# Patient Record
Sex: Male | Born: 1960
Health system: Southern US, Community
[De-identification: ages and names within clinical notes are randomized; demographics above are authoritative.]

## PROBLEM LIST (undated history)

## (undated) DIAGNOSIS — I10 Essential (primary) hypertension: Secondary | ICD-10-CM

## (undated) DIAGNOSIS — K611 Rectal abscess: Secondary | ICD-10-CM

## (undated) DIAGNOSIS — G4730 Sleep apnea, unspecified: Secondary | ICD-10-CM

## (undated) DIAGNOSIS — I2699 Other pulmonary embolism without acute cor pulmonale: Secondary | ICD-10-CM

## (undated) DIAGNOSIS — K5790 Diverticulosis of intestine, part unspecified, without perforation or abscess without bleeding: Secondary | ICD-10-CM

## (undated) DIAGNOSIS — M109 Gout, unspecified: Secondary | ICD-10-CM

## (undated) DIAGNOSIS — N493 Fournier gangrene: Secondary | ICD-10-CM

## (undated) DIAGNOSIS — K635 Polyp of colon: Secondary | ICD-10-CM

## (undated) DIAGNOSIS — K643 Fourth degree hemorrhoids: Secondary | ICD-10-CM

## (undated) DIAGNOSIS — K5010 Crohn's disease of large intestine without complications: Secondary | ICD-10-CM

## (undated) HISTORY — DX: Polyp of colon: K63.5

## (undated) HISTORY — DX: Sleep apnea, unspecified: G47.30

## (undated) HISTORY — DX: Diverticulosis of intestine, part unspecified, without perforation or abscess without bleeding: K57.90

## (undated) HISTORY — PX: ANAL FISSURE REPAIR: SHX2312

## (undated) HISTORY — DX: Other pulmonary embolism without acute cor pulmonale: I26.99

## (undated) HISTORY — DX: Essential (primary) hypertension: I10

## (undated) HISTORY — PX: COLONOSCOPY: SHX174

---
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
## 2000-01-17 ENCOUNTER — Encounter: Admission: RE | Admit: 2000-01-17 | Discharge: 2000-01-17 | Payer: Self-pay | Admitting: Orthopedic Surgery

## 2000-01-17 ENCOUNTER — Encounter: Payer: Self-pay | Admitting: Orthopedic Surgery

## 2011-07-28 ENCOUNTER — Encounter: Payer: Self-pay | Admitting: Internal Medicine

## 2011-08-23 ENCOUNTER — Encounter: Payer: Self-pay | Admitting: Internal Medicine

## 2011-09-29 ENCOUNTER — Encounter: Payer: Self-pay | Admitting: Internal Medicine

## 2011-10-16 ENCOUNTER — Ambulatory Visit (AMBULATORY_SURGERY_CENTER): Payer: 59 | Admitting: *Deleted

## 2011-10-16 ENCOUNTER — Encounter: Payer: Self-pay | Admitting: Internal Medicine

## 2011-10-16 VITALS — Ht 71.0 in | Wt 209.6 lb

## 2011-10-16 DIAGNOSIS — Z1211 Encounter for screening for malignant neoplasm of colon: Secondary | ICD-10-CM

## 2011-10-16 MED ORDER — PEG-KCL-NACL-NASULF-NA ASC-C 100 G PO SOLR: ORAL | Status: DC

## 2011-10-30 ENCOUNTER — Ambulatory Visit (AMBULATORY_SURGERY_CENTER): Payer: 59 | Admitting: Internal Medicine

## 2011-10-30 ENCOUNTER — Encounter: Payer: Self-pay | Admitting: Internal Medicine

## 2011-10-30 VITALS — BP 139/77 | HR 67 | Temp 95.7°F | Resp 18 | Ht 71.0 in | Wt 209.0 lb

## 2011-10-30 DIAGNOSIS — Z1211 Encounter for screening for malignant neoplasm of colon: Secondary | ICD-10-CM

## 2011-10-30 DIAGNOSIS — D126 Benign neoplasm of colon, unspecified: Secondary | ICD-10-CM

## 2011-10-30 DIAGNOSIS — K5289 Other specified noninfective gastroenteritis and colitis: Secondary | ICD-10-CM

## 2011-10-30 DIAGNOSIS — K529 Noninfective gastroenteritis and colitis, unspecified: Secondary | ICD-10-CM

## 2011-10-30 MED ORDER — SODIUM CHLORIDE 0.9 % IV SOLN: 500.0000 mL | INTRAVENOUS | Status: DC

## 2011-10-30 NOTE — Op Note (Signed)
[facility] [address] [facility] [address]. [place], [place]  [id number]  COLONOSCOPY PROCEDURE REPORT  PATIENT:  Jesse English, Jesse English  MR#:  [id number] BIRTHDATE:  1960-12-10, 68 yrs. old  GENDER:  male ENDOSCOPIST:  Lowella Bandy. Olevia Perches, MD REF. BY:  Janalyn Rouse, M.D. PROCEDURE DATE:  10/30/2011 PROCEDURE:  Colonoscopy with biopsy and snare polypectomy ASA CLASS:  Class II INDICATIONS:  Routine Risk Screening MEDICATIONS:   MAC sedation, administered by CRNA, propofol (Diprivan) 630 mg  DESCRIPTION OF PROCEDURE:   After the risks and benefits and of the procedure were explained, informed consent was obtained. Digital rectal exam was performed and revealed no rectal masses. The LB CF-[id number] endoscope was introduced through the anus and advanced to the cecum, which was identified by both the appendix and ileocecal valve.  The quality of the prep was good, using MoviPrep.  The instrument was then slowly withdrawn as the colon was fully examined. <<PROCEDUREIMAGES>>  FINDINGS:  Colitis was found in the sigmoid colon. nonspecific colitis 50-60 cm, granular mucosa With standard forceps, biopsy was obtained and sent to pathology (see image6).  A sessile polyp was found in the sigmoid colon. at 15 cm soft appearing 10 mm polyp Polyp was snared, then cauterized with monopolar cautery. Retrieval was successful (see image8 and image9). snare polyp Internal Hemorrhoids were found (see image10).  This was otherwise a normal examination of the colon (see image7, image5, image4, image2, and image3).  Mild diverticulosis was found in the sigmoid colon (see image7).   Retroflexed views in the rectum revealed no abnormalities.    The scope was then withdrawn from the patient and the procedure completed.  COMPLICATIONS:  None ENDOSCOPIC IMPRESSION: 1) Colitis in the sigmoid colon 2) Sessile polyp in the sigmoid colon 3) Internal hemorrhoids 4) Otherwise normal examination 5) Mild diverticulosis  in the sigmoid colon RECOMMENDATIONS: 1) Await pathology results 2) Await biopsy results 3) High fiber diet.  REPEAT EXAM:  In 5 year(s) for.  ______________________________ Lowella Bandy. Olevia Perches, MD  CC:  n. eSIGNED:   Lowella Bandy. Elisabel Hanover at 10/30/2011 09:47 AM  Cyndee Brightly, [phone]

## 2011-10-30 NOTE — Patient Instructions (Signed)
YOU HAD AN ENDOSCOPIC PROCEDURE TODAY AT [facility]: Refer to the procedure report that was given to you for any specific questions about what was found during the examination.  If the procedure report does not answer your questions, please call your gastroenterologist to clarify.  If you requested that your care partner not be given the details of your procedure findings, then the procedure report has been included in a sealed envelope for you to review at your convenience later.  YOU SHOULD EXPECT: Some feelings of bloating in the abdomen. Passage of more gas than usual.  Walking can help get rid of the air that was put into your GI tract during the procedure and reduce the bloating. If you had a lower endoscopy (such as a colonoscopy or flexible sigmoidoscopy) you may notice spotting of blood in your stool or on the toilet paper. If you underwent a bowel prep for your procedure, then you may not have a normal bowel movement for a few days.  DIET: Your first meal following the procedure should be a light meal and then it is ok to progress to your normal diet.  A half-sandwich or bowl of soup is an example of a good first meal.  Heavy or fried foods are harder to digest and may make you feel nauseous or bloated.  Likewise meals heavy in dairy and vegetables can cause extra gas to form and this can also increase the bloating.  Drink plenty of fluids but you should avoid alcoholic beverages for 24 hours.  ACTIVITY: Your care partner should take you home directly after the procedure.  You should plan to take it easy, moving slowly for the rest of the day.  You can resume normal activity the day after the procedure however you should NOT DRIVE or use heavy machinery for 24 hours (because of the sedation medicines used during the test).    SYMPTOMS TO REPORT IMMEDIATELY: A gastroenterologist can be reached at any hour.  During normal business hours, 8:30 AM to 5:00 PM Monday through Friday,  call [phone].  After hours and on weekends, please call the GI answering service at [phone] who will take a message and have the physician on call contact you.   Following lower endoscopy (colonoscopy or flexible sigmoidoscopy):  Excessive amounts of blood in the stool  Significant tenderness or worsening of abdominal pains  Swelling of the abdomen that is new, acute  Fever of 100F or higher   FOLLOW UP: If any biopsies were taken you will be contacted by phone or by letter within the next 1-3 weeks.  Call your gastroenterologist if you have not heard about the biopsies in 3 weeks.  Our staff will call the home number listed on your records the next business day following your procedure to check on you and address any questions or concerns that you may have at that time regarding the information given to you following your procedure. This is a courtesy call and so if there is no answer at the home number and we have not heard from you through the emergency physician on call, we will assume that you have returned to your regular daily activities without incident.  SIGNATURES/CONFIDENTIALITY: You and/or your care partner have signed paperwork which will be entered into your electronic medical record.  These signatures attest to the fact that that the information above on your After Visit Summary has been reviewed and is understood.  Full responsibility of the confidentiality of  this discharge information lies with you and/or your care-partner.   Follow discharge instructions given today. Biopsies taken today, colon polyp x1 removed. Handouts given on polyps,hemorrhoids,diverticulosis,and high fiber diet. Try to follow high fiber diet. Repeat colonoscopy in 5 years. Call us with any questions or concerns. Thank you!!

## 2011-10-30 NOTE — Progress Notes (Signed)
Patient did not experience any of the following events: a burn prior to discharge; a fall within the facility; wrong site/side/patient/procedure/implant event; or a hospital transfer or hospital admission upon discharge from the facility. (G8907) Patient did not have preoperative order for IV antibiotic SSI prophylaxis. (G8918)  

## 2011-10-31 ENCOUNTER — Telehealth: Payer: Self-pay | Admitting: *Deleted

## 2011-10-31 NOTE — Telephone Encounter (Signed)
No answer left message to call offfice if questions or concerns.

## 2011-11-02 ENCOUNTER — Encounter: Payer: Self-pay | Admitting: Internal Medicine

## 2015-08-11 ENCOUNTER — Encounter: Payer: Self-pay | Admitting: Gastroenterology

## 2015-08-12 ENCOUNTER — Other Ambulatory Visit: Payer: Self-pay | Admitting: Internal Medicine

## 2015-08-12 DIAGNOSIS — K5792 Diverticulitis of intestine, part unspecified, without perforation or abscess without bleeding: Secondary | ICD-10-CM

## 2015-08-16 ENCOUNTER — Ambulatory Visit
Admission: RE | Admit: 2015-08-16 | Discharge: 2015-08-16 | Disposition: A | Discharge status: Home / Self Care | Payer: 59 | Source: Ambulatory Visit | Attending: Internal Medicine | Admitting: Internal Medicine

## 2015-08-16 DIAGNOSIS — K5792 Diverticulitis of intestine, part unspecified, without perforation or abscess without bleeding: Secondary | ICD-10-CM

## 2015-08-16 MED ORDER — IOPAMIDOL (ISOVUE-300) INJECTION 61%
125.0000 mL | Freq: Once | INTRAVENOUS | Status: AC | PRN
Start: 2015-08-16 — End: 2015-08-16
  Administered 2015-08-16: 125 mL via INTRAVENOUS

## 2015-08-16 MED ORDER — IOHEXOL 300 MG/ML  SOLN
30.0000 mL | Freq: Once | INTRAMUSCULAR | Status: AC | PRN
  Administered 2015-08-16: 30 mL via ORAL

## 2015-09-01 ENCOUNTER — Encounter: Payer: Self-pay | Admitting: Gastroenterology

## 2015-09-01 ENCOUNTER — Other Ambulatory Visit (INDEPENDENT_AMBULATORY_CARE_PROVIDER_SITE_OTHER): Payer: 59

## 2015-09-01 ENCOUNTER — Ambulatory Visit (INDEPENDENT_AMBULATORY_CARE_PROVIDER_SITE_OTHER): Payer: 59 | Admitting: Gastroenterology

## 2015-09-01 VITALS — BP 130/74 | HR 80 | Ht 71.0 in | Wt 234.6 lb

## 2015-09-01 DIAGNOSIS — R197 Diarrhea, unspecified: Secondary | ICD-10-CM | POA: Diagnosis not present

## 2015-09-01 DIAGNOSIS — K529 Noninfective gastroenteritis and colitis, unspecified: Secondary | ICD-10-CM | POA: Diagnosis not present

## 2015-09-01 DIAGNOSIS — K625 Hemorrhage of anus and rectum: Secondary | ICD-10-CM

## 2015-09-01 LAB — HEPATIC FUNCTION PANEL
ALBUMIN: 4 g/dL (ref 3.5–5.2)
ALT: 26 U/L (ref 0–53)
AST: 24 U/L (ref 0–37)
Alkaline Phosphatase: 35 U/L — ABNORMAL LOW (ref 39–117)
BILIRUBIN TOTAL: 0.8 mg/dL (ref 0.2–1.2)
Bilirubin, Direct: 0.1 mg/dL (ref 0.0–0.3)
Total Protein: 7.2 g/dL (ref 6.0–8.3)

## 2015-09-01 LAB — FERRITIN: Ferritin: 169.5 ng/mL (ref 22.0–322.0)

## 2015-09-01 LAB — FOLATE: FOLATE: 9.1 ng/mL (ref >5.9)

## 2015-09-01 LAB — IBC PANEL
Iron: 112 ug/dL (ref 42–165)
SATURATION RATIOS: 31.5 % (ref 20.0–50.0)
TRANSFERRIN: 254 mg/dL (ref 212.0–360.0)

## 2015-09-01 LAB — VITAMIN B12: VITAMIN B 12: 188 pg/mL — AB (ref 211–911)

## 2015-09-01 LAB — SEDIMENTATION RATE: SED RATE: 14 mm/h (ref 0–22)

## 2015-09-01 LAB — HIGH SENSITIVITY CRP: CRP, High Sensitivity: 0.77 mg/L (ref 0.000–5.000)

## 2015-09-01 LAB — TSH: TSH: 0.31 u[IU]/mL — ABNORMAL LOW (ref 0.35–4.50)

## 2015-09-01 MED ORDER — NA SULFATE-K SULFATE-MG SULF 17.5-3.13-1.6 GM/177ML PO SOLN: 1.0000 | Freq: Once | ORAL | Status: DC

## 2015-09-01 NOTE — Progress Notes (Signed)
Jesse English    [id number]    11/29/1960  Primary Care Physician:Shaw, Gwyndolyn Saxon, MD  Referring Physician: Marton Redwood, MD [address] [place], [place] [address]  Chief complaint:  Diarrhea and blood per rectum  HPI: 55 yr M here for new patient visit with complaints of diarrhea and blood per rectum he started having symptoms around Christmas. He noticed increased bowel frequency and liquid bowel movements with blood and mucus .He was having about 20 episodes a day , at that time he was traveling and was visiting his daughter . 2 weeks later when he returned his PMD started him on Cipro and Flagyl which improved his symptoms, currently he feels his bowel movements are starting to get a little more formed and does not have blood , he continues to have to 3 bowel movements per day which is slightly more than his normal bowel habits.  Outpatient Encounter Prescriptions as of 09/01/2015  Medication Sig  . [DISCONTINUED] amLODipine (NORVASC) 5 MG tablet Take 5 mg by mouth daily. Reported on 09/01/2015  . [DISCONTINUED] hydrochlorothiazide (MICROZIDE) 12.5 MG capsule Take 12.5 mg by mouth daily. Reported on 09/01/2015   No facility-administered encounter medications on file as of 09/01/2015.    Allergies as of 09/01/2015  . (No Known Allergies)    Past Medical History  Diagnosis Date  . Hypertension   . Sleep apnea     cpap  . Colon polyps     2013  . Diverticulosis     Past Surgical History  Procedure Laterality Date  . No past surgeries      Family History  Problem Relation Age of Onset  . Colon cancer Neg Hx   . Esophageal cancer Neg Hx   . Stomach cancer Neg Hx   . Rectal cancer Neg Hx     Social History   Social History  . Marital Status: Single    Spouse Name: N/A  . Number of Children: N/A  . Years of Education: N/A   Occupational History  . Not on file.   Social History Main Topics  . Smoking status: Current Some Day Smoker    Types:  Cigars  . Smokeless tobacco: Never Used     Comment: smokes 3 4 cigars weekly  . Alcohol Use: 6.0 oz/week    10 Cans of beer per week  . Drug Use: No  . Sexual Activity: Not on file   Other Topics Concern  . Not on file   Social History Narrative      Review of systems: Review of Systems  Constitutional: Negative for fever and chills.  HENT: Negative.   Eyes: Negative for blurred vision.  Respiratory: Negative for cough, shortness of breath and wheezing.   Cardiovascular: Negative for chest pain and palpitations.  Gastrointestinal: as per HPI Genitourinary: Negative for dysuria, urgency, frequency and hematuria.  Musculoskeletal: Negative for myalgias, back pain and joint pain.  Skin: Negative for itching and rash.  Neurological: Negative for dizziness, tremors, focal weakness, seizures and loss of consciousness.  Endo/Heme/Allergies: Negative for environmental allergies.  Psychiatric/Behavioral: Negative for depression, suicidal ideas and hallucinations.  All other systems reviewed and are negative.   Physical Exam: Filed Vitals:   09/01/15 0831  BP: 130/74  Pulse: 80   Gen:      No acute distress HEENT:  EOMI, sclera anicteric Neck:     No masses; no thyromegaly Lungs:    Clear to auscultation  bilaterally; normal respiratory effort CV:         Regular rate and rhythm; no murmurs Abd:      + bowel sounds; soft, non-tender; no palpable masses, no distension Ext:    No edema; adequate peripheral perfusion Skin:      Warm and dry; no rash Neuro: alert and oriented x 3 Psych: normal mood and affect  Data Reviewed:  Colonoscopy 10/2011 Findings suggestive of colitis in sigmoid colon, 10 mm sessile polyp removed with snare cautery. Internal hemorrhoids 1. Surgical [P], colon at 50-60 cm, bx CHRONIC ACTIVE COLITIS. SEE MICROSCOPIC DESCRIPTION. NO EVIDENCE OF DYSPLASIA. 2. Surgical [P], colon at 20 cm, polyp HYPERPLASTIC POLYP. NO ADENOMATOUS CHANGE OR  MALIGNANCY Microscopic Comment 1. There is increased infiltrate in the lamina propria with associated benign lymphoid aggregates. There is patchy, mild, neutrophilic cryptitis and altered crypt architecture. The most likely differential diagnostic consideration is idiopathic inflammatory bowel disease. No dysplasia or granulomas are identified.  CT abd & pelvis 08/16/2015 1. No acute findings. 2. No evidence of malignancy. 3. Healed granuloma in the left lower lobe. 4. No abnormality of the gastrointestinal tract. 5. Prominent degenerative changes noted throughout the visualized spine. Assessment and Plan/Recommendations: 55 year old male here with complaints of diarrhea and blood per rectum He had findings suggestive of chronic active colitis in the left colon based on his colonoscopy in 2013 His symptoms have somewhat improved after a course of Cipro and Flagyl a few weeks ago We'll check CBC, BMP, LFT, ESR, C. difficile and fecal lactoferrin Schedule for colonoscopy   Damaris Hippo , MD [phone] Mon-Fri 8a-5p [phone] after 5p, weekends, holidays

## 2015-09-01 NOTE — Patient Instructions (Signed)

## 2015-09-03 ENCOUNTER — Telehealth: Payer: Self-pay | Admitting: *Deleted

## 2015-09-03 ENCOUNTER — Other Ambulatory Visit: Payer: Self-pay

## 2015-09-03 LAB — FECAL LACTOFERRIN, QUANT: Lactoferrin: NEGATIVE

## 2015-09-03 LAB — CLOSTRIDIUM DIFFICILE BY PCR: CDIFFPCR: DETECTED — AB

## 2015-09-03 MED ORDER — METRONIDAZOLE 500 MG PO TABS: 500.0000 mg | ORAL_TABLET | Freq: Three times a day (TID) | ORAL | Status: DC

## 2015-09-03 NOTE — Telephone Encounter (Signed)
L/M for patient that he has a prescription at [facility] to pick up. Told him to call back before 5pm if he got my message   Patient has positive c diff

## 2015-09-06 ENCOUNTER — Telehealth: Payer: Self-pay | Admitting: Gastroenterology

## 2015-09-23 ENCOUNTER — Encounter: Payer: Self-pay | Admitting: Gastroenterology

## 2015-09-23 ENCOUNTER — Ambulatory Visit (AMBULATORY_SURGERY_CENTER): Payer: 59 | Admitting: Gastroenterology

## 2015-09-23 VITALS — BP 134/77 | HR 68 | Temp 98.2°F | Resp 15 | Ht 71.0 in | Wt 234.0 lb

## 2015-09-23 DIAGNOSIS — K625 Hemorrhage of anus and rectum: Secondary | ICD-10-CM

## 2015-09-23 MED ORDER — SODIUM CHLORIDE 0.9 % IV SOLN: 500.0000 mL | INTRAVENOUS | Status: DC

## 2015-09-23 MED ORDER — MESALAMINE 1000 MG RE SUPP: 1000.0000 mg | Freq: Every day | RECTAL | Status: DC

## 2015-09-23 NOTE — Progress Notes (Signed)
A o x3 report to [employer]

## 2015-09-23 NOTE — Patient Instructions (Signed)

## 2015-09-23 NOTE — Op Note (Signed)
[place] Patient Name: Jesse English Procedure Date: 09/23/2015 7:36 AM MRN: [id number] Endoscopist: Mauri Pole , MD Age: 55 Referring MD:  Date of Birth: Nov 21, 1960 Gender: Male Procedure:                Colonoscopy Indications:              Clinically significant diarrhea of unexplained                            origin, Rectal bleeding Medicines:                Propofol total dose 400 mg IV, Monitored Anesthesia                            Care Procedure:                Pre-Anesthesia Assessment:                           - Prior to the procedure, a History and Physical                            was performed, and patient medications and                            allergies were reviewed. The patient's tolerance of                            previous anesthesia was also reviewed. The risks                            and benefits of the procedure and the sedation                            options and risks were discussed with the patient.                            All questions were answered, and informed consent                            was obtained. Anticoagulants: The patient has taken                            aspirin. It was decided not to withhold this                            medication prior to procedure. ASA Grade                            Assessment: II - A patient with mild systemic                            disease. After reviewing the risks and benefits,  the patient was deemed in satisfactory condition to                            undergo the procedure.                           After obtaining informed consent, the colonoscope                            was passed under direct vision. Throughout the                            procedure, the patient's blood pressure, pulse, and                            oxygen saturations were monitored continuously. The                            Model CF-HQ190L [id number])  scope was introduced                            through the anus and advanced to the the terminal                            ileum, with identification of the appendiceal                            orifice and IC valve. The colonoscopy was performed                            without difficulty. The patient tolerated the                            procedure well. The quality of the bowel                            preparation was good. The terminal ileum, ileocecal                            valve, appendiceal orifice, and rectum were                            photographed. Scope In: 8:17:23 AM Scope Out: 8:28:44 AM Scope Withdrawal Time: 0 hours 8 minutes 36 seconds  Total Procedure Duration: 0 hours 11 minutes 21 seconds  Findings:      Non-bleeding internal hemorrhoids were found during retroflexion. The       hemorrhoids were small.      An area of moderately congested mucosa was found in the recto-sigmoid       colon. Several biopsies were obtained with cold forceps for evaluation       of inflammatory bowel disease randomly in the rectum, in the sigmoid       colon and in the descending colon. Complications:            No immediate complications. Estimated Blood  Loss:     Estimated blood loss was minimal. Impression:               - Non-bleeding internal hemorrhoids.                           - Congested mucosa in the recto-sigmoid colon.                           - Several biopsies were obtained in the rectum, in                            the sigmoid colon and in the descending colon. Recommendation:           - Patient has a contact number available for                            emergencies. The signs and symptoms of potential                            delayed complications were discussed with the                            patient. Return to normal activities tomorrow.                            Written discharge instructions were provided to the                             patient.                           - Resume previous diet.                           - Continue present medications.                           - Await pathology results.                           - Repeat colonoscopy is recommended for                            surveillance. The colonoscopy date will be                            determined after pathology results from today's                            exam become available for review.                           - Return to GI clinic in 3 months.                           - Use Canasa 1000 mg suppository  1 per rectum QHS                            for 2 weeks. Procedure Code(s):        --- Professional ---                           [id number], Colonoscopy, flexible; with biopsy, single                            or multiple CPT copyright 2016 American Medical Association. All rights reserved. Mauri Pole, MD 09/23/2015 8:38:11 AM This report has been signed electronically. Number of Addenda: 0

## 2015-09-23 NOTE — Progress Notes (Signed)
Called to room to assist during endoscopic procedure.  Patient ID and intended procedure confirmed with present staff. Received instructions for my participation in the procedure from the performing physician.  

## 2015-09-24 ENCOUNTER — Telehealth: Payer: Self-pay

## 2015-09-24 NOTE — Telephone Encounter (Signed)
  Follow up Call-  Call back number 09/23/2015  Post procedure Call Back phone  # [phone]  Permission to leave phone message Yes    Patient was called for follow up after procedure on 09/23/2015. No answer at the number given for follow up phone call. A message was left on the answering machine.

## 2015-10-01 ENCOUNTER — Encounter: Payer: Self-pay | Admitting: Gastroenterology

## 2015-10-04 ENCOUNTER — Other Ambulatory Visit: Payer: Self-pay | Admitting: Gastroenterology

## 2015-10-04 ENCOUNTER — Encounter: Payer: Self-pay | Admitting: Gastroenterology

## 2015-10-04 DIAGNOSIS — K5100 Ulcerative (chronic) pancolitis without complications: Secondary | ICD-10-CM

## 2015-10-04 MED ORDER — MESALAMINE 1.2 G PO TBEC: 2.4000 g | DELAYED_RELEASE_TABLET | Freq: Every day | ORAL | Status: DC

## 2015-10-15 ENCOUNTER — Other Ambulatory Visit: Payer: Self-pay | Admitting: Gastroenterology

## 2015-10-18 ENCOUNTER — Telehealth: Payer: Self-pay | Admitting: Gastroenterology

## 2015-10-18 MED ORDER — MESALAMINE 1.2 G PO TBEC: 2.4000 g | DELAYED_RELEASE_TABLET | Freq: Every day | ORAL | Status: DC

## 2015-10-18 NOTE — Telephone Encounter (Signed)
Med sent patient informed

## 2015-10-18 NOTE — Telephone Encounter (Signed)
Faxed prescription to patient  [facility] did not receive script  I cant get through to [facility] on there automated system

## 2015-11-04 ENCOUNTER — Other Ambulatory Visit: Payer: 59

## 2015-11-04 ENCOUNTER — Telehealth: Payer: Self-pay | Admitting: Gastroenterology

## 2015-11-04 ENCOUNTER — Other Ambulatory Visit: Payer: Self-pay

## 2015-11-04 DIAGNOSIS — K51019 Ulcerative (chronic) pancolitis with unspecified complications: Secondary | ICD-10-CM

## 2015-11-04 MED ORDER — VANCOMYCIN HCL 125 MG PO CAPS: 125.0000 mg | ORAL_CAPSULE | Freq: Four times a day (QID) | ORAL | Status: DC

## 2015-11-04 MED ORDER — MESALAMINE 1000 MG RE SUPP: 1000.0000 mg | Freq: Every day | RECTAL | Status: DC

## 2015-11-04 NOTE — Telephone Encounter (Signed)
Colitis patient. Last colonoscopy was in March. Bx was active chronic colitis. He had been doing well until about 5 days ago. He is experiencing bloody bowel movements about 8 to 10 a day. He absolutely does not want any Flagyl.

## 2015-11-04 NOTE — Telephone Encounter (Signed)
Please have him check C.diff and we can start PO vancomycin 136m Q6h empirically . Continue Lialda and Canasa suppository refill for 2-4 weeks. He will need office visit , ok to schedule with APP

## 2015-11-05 LAB — CLOSTRIDIUM DIFFICILE BY PCR: Toxigenic C. Difficile by PCR: DETECTED — CR

## 2015-11-18 ENCOUNTER — Encounter: Payer: Self-pay | Admitting: Physician Assistant

## 2015-11-18 ENCOUNTER — Ambulatory Visit (INDEPENDENT_AMBULATORY_CARE_PROVIDER_SITE_OTHER): Payer: 59 | Admitting: Physician Assistant

## 2015-11-18 ENCOUNTER — Other Ambulatory Visit (INDEPENDENT_AMBULATORY_CARE_PROVIDER_SITE_OTHER): Payer: 59

## 2015-11-18 VITALS — BP 118/68 | HR 66 | Ht 71.0 in | Wt 239.0 lb

## 2015-11-18 DIAGNOSIS — K51918 Ulcerative colitis, unspecified with other complication: Secondary | ICD-10-CM | POA: Diagnosis not present

## 2015-11-18 DIAGNOSIS — K625 Hemorrhage of anus and rectum: Secondary | ICD-10-CM | POA: Diagnosis not present

## 2015-11-18 DIAGNOSIS — A047 Enterocolitis due to Clostridium difficile: Secondary | ICD-10-CM

## 2015-11-18 DIAGNOSIS — A0472 Enterocolitis due to Clostridium difficile, not specified as recurrent: Secondary | ICD-10-CM

## 2015-11-18 LAB — CBC WITH DIFFERENTIAL/PLATELET
BASOS ABS: 0 10*3/uL (ref 0.0–0.1)
Basophils Relative: 0.6 % (ref 0.0–3.0)
Eosinophils Absolute: 0.4 10*3/uL (ref 0.0–0.7)
Eosinophils Relative: 5.7 % — ABNORMAL HIGH (ref 0.0–5.0)
HEMATOCRIT: 43.8 % (ref 39.0–52.0)
Hemoglobin: 15.1 g/dL (ref 13.0–17.0)
LYMPHS PCT: 23.8 % (ref 12.0–46.0)
Lymphs Abs: 1.6 10*3/uL (ref 0.7–4.0)
MCHC: 34.6 g/dL (ref 30.0–36.0)
MCV: 93 fl (ref 78.0–100.0)
MONOS PCT: 14.9 % — AB (ref 3.0–12.0)
Monocytes Absolute: 1 10*3/uL (ref 0.1–1.0)
Neutro Abs: 3.7 10*3/uL (ref 1.4–7.7)
Neutrophils Relative %: 55 % (ref 43.0–77.0)
PLATELETS: 213 10*3/uL (ref 150.0–400.0)
RBC: 4.71 Mil/uL (ref 4.22–5.81)
RDW: 13.3 % (ref 11.5–15.5)
WBC: 6.7 10*3/uL (ref 4.0–10.5)

## 2015-11-18 LAB — BASIC METABOLIC PANEL
BUN: 24 mg/dL — AB (ref 6–23)
CO2: 29 mEq/L (ref 19–32)
CREATININE: 1.23 mg/dL (ref 0.40–1.50)
Calcium: 9.6 mg/dL (ref 8.4–10.5)
Chloride: 101 mEq/L (ref 96–112)
GFR: 64.39 mL/min (ref >60.00)
Glucose, Bld: 105 mg/dL — ABNORMAL HIGH (ref 70–99)
POTASSIUM: 4.5 meq/L (ref 3.5–5.1)
Sodium: 139 mEq/L (ref 135–145)

## 2015-11-18 LAB — SEDIMENTATION RATE: SED RATE: 20 mm/h (ref 0–22)

## 2015-11-18 LAB — HIGH SENSITIVITY CRP: CRP, High Sensitivity: 1.79 mg/L (ref 0.000–5.000)

## 2015-11-18 MED ORDER — MESALAMINE 4 G RE ENEM: 4.0000 g | ENEMA | Freq: Every day | RECTAL | Status: DC

## 2015-11-18 MED ORDER — BUDESONIDE 9 MG PO TB24: 9.0000 mg | ORAL_TABLET | Freq: Every morning | ORAL | Status: DC

## 2015-11-18 MED ORDER — MESALAMINE 1.2 G PO TBEC: DELAYED_RELEASE_TABLET | ORAL | Status: DC

## 2015-11-18 MED ORDER — VANCOMYCIN HCL 125 MG PO CAPS: ORAL_CAPSULE | ORAL | Status: DC

## 2015-11-18 NOTE — Patient Instructions (Addendum)
Please go to the basement level to have your labs drawn.  Stop the Canasa. Start Rowasa Enema at bedtime.  Increase the Lialda to 2 tablets by mouth twice daily.   We sent prescriptions to [facility] [address]  1. [place] 9 mg 2. Lialda 1.2g 3. Rowasa Enemas 4. Vancomycin 125 mg.   You have a follow up with Dr. Silverio Decamp on 12-22-2015 at 9:30 am.

## 2015-11-18 NOTE — Progress Notes (Signed)
Patient ID: Jesse English, male   DOB: [birth year], 55 y.o.   MRN: [id number]   Subjective:    Patient ID: Jesse English, male    DOB: Aug 15, 1960, 55 y.o.   MRN: [id number]  HPI Jesse English  Is a pleasant 55 year old white male recently established with Dr. Silverio Decamp who has a new diagnosis of chronic active colitis made at the time of colonoscopy in March 2017. Appendectomy was found to have a rectosigmoid colitis. Biopsies from the rectum showed the chronic active colitis consistent with IBD and random biopsies of the colon showed chronic quiescent colitis. Stool studies have been done in February and he was C. difficile positive, initially treated with a course of Flagyl. Because of persistent complaints of diarrhea C. difficile was repeated 11/04/2015 and again was positive. He is now completing a 14 day course of vancomycin. He comes in today for follow-up stating that he really doesn't feel any better. Still having 15-20 bowel movements per day of very small volume urgent stools. He says very frequently passing bloody clots with soft stool. Is not having any significant abdominal pain does have some cramping. No documented fever but endorses rare chills. Appetite is been fine no nausea or vomiting. Says is having difficulty retaining the Canasa suppositories and that infrequently comes out in the middle of the night.  Review of Systems Pertinent positive and negative review of systems were noted in the above HPI section.  All other review of systems was otherwise negative.  Outpatient Encounter Prescriptions as of 11/18/2015  Medication Sig  . hydrochlorothiazide (MICROZIDE) 12.5 MG capsule TK 1 C PO QD  . mesalamine (CANASA) 1000 MG suppository Place 1 suppository (1,000 mg total) rectally at bedtime.  . mesalamine (LIALDA) 1.2 g EC tablet Take 2 tablets twice daily  . vancomycin (VANCOCIN) 125 MG capsule Take 1 tab 3 times daily x 7 days, then twice daily x 7 days, then 1 daily x 7 days.  .  [DISCONTINUED] mesalamine (LIALDA) 1.2 g EC tablet Take 2 tablets (2.4 g total) by mouth daily with breakfast.  . [DISCONTINUED] vancomycin (VANCOCIN) 125 MG capsule Take 1 capsule (125 mg total) by mouth every 6 (six) hours.  . Budesonide (UCERIS) 9 MG TB24 Take 9 mg by mouth every morning.  . mesalamine (ROWASA) 4 g enema Place 60 mLs (4 g total) rectally at bedtime.   No facility-administered encounter medications on file as of 11/18/2015.   Allergies  Allergen Reactions  . Ace Inhibitors Swelling    Facial swelling and small intestinal swelling.   There are no active problems to display for this patient.  Social History   Social History  . Marital Status: Single    Spouse Name: N/A  . Number of Children: N/A  . Years of Education: N/A   Occupational History  . Not on file.   Social History Main Topics  . Smoking status: Current Some Day Smoker    Types: Cigars  . Smokeless tobacco: Never Used     Comment: smokes 3 4 cigars weekly  . Alcohol Use: 6.0 oz/week    10 Cans of beer per week  . Drug Use: No  . Sexual Activity: Not on file   Other Topics Concern  . Not on file   Social History Narrative    Mr. Dempster's family history is negative for Colon cancer, Esophageal cancer, Stomach cancer, and Rectal cancer.      Objective:    Filed Vitals:   11/18/15 9373  BP: 118/68  Pulse: 66    Physical Exam  well-developed white male in no acute distress, pleasant blood pressure 118/68 pulse 66 height 5 foot 11 weight 239. HEENT; nontraumatic normocephalic EOMI PERRLA sclera anicteric, Cardiovascular ;regular rate and rhythm with S1-S2 no murmur or gallop, Pulmonary; clear bilaterally, Abdomen ;large soft. Minimally tender in left lower quadrant no guarding or rebound no palpable mass or hepatosplenomegaly bowel sounds are present, Rectal ;exam not done, Ext;no clubbing cyanosis or edema skin warm and dry, Neuropsych ;mood and affect appropriate     Assessment &  Plan:   #58  55 year old white male with new diagnosis of chronic active colitis and superimposed C. difficile. Patient is stable but symptoms have not improved on current regimen. He is on day 12 of a 14 day course of vancomycin for C. difficile.  Plan; increase Lialda to 2.4 g by mouth twice a day Start Uceris 9 mg by mouth every morning Stop Canasa suppositories and start Rowasa enemas daily at bedtime he is asked to continue 1 month Will continue a longer tapered course of vancomycin to assure clearance of C. difficile.  Complete the 14 days , then will decrease to 125 mg 3 times a day 7 days then 125 mg by mouth twice a day 7 days then once daily 7 days. He'll have follow-up office visit with Dr. Silverio Decamp in 4-6 weeks. He knows to call in the interim should his symptoms worsen.   Amy Genia Harold PA-C 11/18/2015   Cc: Marton Redwood, MD

## 2015-11-19 ENCOUNTER — Other Ambulatory Visit: Payer: Self-pay

## 2015-11-19 MED ORDER — VANCOMYCIN HCL 125 MG PO CAPS: ORAL_CAPSULE | ORAL | Status: DC

## 2015-11-19 MED ORDER — MESALAMINE 4 G RE ENEM: 4.0000 g | ENEMA | Freq: Every day | RECTAL | Status: DC

## 2015-11-19 MED ORDER — MESALAMINE 1.2 G PO TBEC: DELAYED_RELEASE_TABLET | ORAL | Status: DC

## 2015-11-19 MED ORDER — BUDESONIDE 9 MG PO TB24: 9.0000 mg | ORAL_TABLET | Freq: Every morning | ORAL | Status: DC

## 2015-11-19 NOTE — Progress Notes (Signed)
Reviewed and agree with documentation and assessment and plan. If continues to be symptomatic even after completion of PO Vancomycin prolonged taper, may have to consider flex sig to r/o CMV K. Denzil Magnuson , MD

## 2015-11-29 ENCOUNTER — Ambulatory Visit: Payer: 59 | Admitting: Gastroenterology

## 2015-12-22 ENCOUNTER — Encounter: Payer: Self-pay | Admitting: Gastroenterology

## 2015-12-22 ENCOUNTER — Other Ambulatory Visit (INDEPENDENT_AMBULATORY_CARE_PROVIDER_SITE_OTHER): Payer: 59

## 2015-12-22 ENCOUNTER — Ambulatory Visit (INDEPENDENT_AMBULATORY_CARE_PROVIDER_SITE_OTHER): Payer: 59 | Admitting: Gastroenterology

## 2015-12-22 VITALS — BP 126/80 | HR 80 | Ht 71.0 in | Wt 234.0 lb

## 2015-12-22 DIAGNOSIS — IMO0001 Reserved for inherently not codable concepts without codable children: Secondary | ICD-10-CM

## 2015-12-22 DIAGNOSIS — K5180 Other ulcerative colitis without complications: Secondary | ICD-10-CM

## 2015-12-22 DIAGNOSIS — Z8719 Personal history of other diseases of the digestive system: Secondary | ICD-10-CM | POA: Diagnosis not present

## 2015-12-22 DIAGNOSIS — K5090 Crohn's disease, unspecified, without complications: Secondary | ICD-10-CM | POA: Insufficient documentation

## 2015-12-22 LAB — IBC PANEL
Iron: 160 ug/dL (ref 42–165)
SATURATION RATIOS: 43.5 % (ref 20.0–50.0)
TRANSFERRIN: 263 mg/dL (ref 212.0–360.0)

## 2015-12-22 LAB — CBC WITH DIFFERENTIAL/PLATELET
Basophils Absolute: 0 10*3/uL (ref 0.0–0.1)
Basophils Relative: 0.4 % (ref 0.0–3.0)
EOS ABS: 0.1 10*3/uL (ref 0.0–0.7)
Eosinophils Relative: 1.2 % (ref 0.0–5.0)
HEMATOCRIT: 44.9 % (ref 39.0–52.0)
HEMOGLOBIN: 15.3 g/dL (ref 13.0–17.0)
LYMPHS ABS: 1.8 10*3/uL (ref 0.7–4.0)
LYMPHS PCT: 20.4 % (ref 12.0–46.0)
MCHC: 33.9 g/dL (ref 30.0–36.0)
MCV: 93.9 fl (ref 78.0–100.0)
MONO ABS: 1.1 10*3/uL — AB (ref 0.1–1.0)
MONOS PCT: 12.2 % — AB (ref 3.0–12.0)
Neutro Abs: 5.8 10*3/uL (ref 1.4–7.7)
Neutrophils Relative %: 65.8 % (ref 43.0–77.0)
Platelets: 200 10*3/uL (ref 150.0–400.0)
RBC: 4.78 Mil/uL (ref 4.22–5.81)
RDW: 13.6 % (ref 11.5–15.5)
WBC: 8.8 10*3/uL (ref 4.0–10.5)

## 2015-12-22 LAB — VITAMIN B12: Vitamin B-12: 365 pg/mL (ref 211–911)

## 2015-12-22 LAB — BASIC METABOLIC PANEL
BUN: 36 mg/dL — AB (ref 6–23)
CO2: 28 meq/L (ref 19–32)
Calcium: 9.4 mg/dL (ref 8.4–10.5)
Chloride: 100 mEq/L (ref 96–112)
Creatinine, Ser: 1.32 mg/dL (ref 0.40–1.50)
GFR: 59.33 mL/min — ABNORMAL LOW (ref >60.00)
GLUCOSE: 100 mg/dL — AB (ref 70–99)
POTASSIUM: 3.9 meq/L (ref 3.5–5.1)
SODIUM: 137 meq/L (ref 135–145)

## 2015-12-22 LAB — FERRITIN: FERRITIN: 323.4 ng/mL — AB (ref 22.0–322.0)

## 2015-12-22 LAB — HIGH SENSITIVITY CRP: CRP, High Sensitivity: 1.99 mg/L (ref 0.000–5.000)

## 2015-12-22 LAB — FOLATE: Folate: 12.8 ng/mL (ref >5.9)

## 2015-12-22 MED ORDER — SACCHAROMYCES BOULARDII 250 MG PO CAPS: 250.0000 mg | ORAL_CAPSULE | Freq: Two times a day (BID) | ORAL | Status: DC

## 2015-12-22 NOTE — Progress Notes (Signed)
Jesse English    [id number]    09/15/1960  Primary Care Physician:Shaw, Gwyndolyn Saxon, MD  Referring Physician: Marton Redwood, MD [address] [place], [place] [address]  Chief complaint:  Ulcerative colitis /recurrent C. difficile   HPI: 55 year old male with recently diagnosed left-sided ulcerative colitis in March 2017 in the setting of C. difficile colitis at the time is here for follow-up visit. Patient had recurrent episode of C. difficile colitis in April for which he was treated with a prolonged taper of by mouth of vancomycin. He completed the course last week. He is feeling better with decreased stool frequency and no longer has blood per rectum. He has 3-4 semi-formed bowel movements a day. He is taking Lialda 4.8 g daily. He did Rowasa enemas and took budesonide 9 mg daily for a month 1 month. His currently drinking yogurt with probiotics. No longer feels fatigued and is able to exercise. Denies any nausea, vomiting, abdominal pain, melena or bright red blood per rectum    Outpatient Encounter Prescriptions as of 12/22/2015  Medication Sig  . [DISCONTINUED] mesalamine (LIALDA) 1.2 g EC tablet Take 2 tablets twice daily  . saccharomyces boulardii (FLORASTOR) 250 MG capsule Take 1 capsule (250 mg total) by mouth 2 (two) times daily.  . [DISCONTINUED] Budesonide (UCERIS) 9 MG TB24 Take 9 mg by mouth every morning.  . [DISCONTINUED] hydrochlorothiazide (MICROZIDE) 12.5 MG capsule TK 1 C PO QD  . [DISCONTINUED] mesalamine (CANASA) 1000 MG suppository Place 1 suppository (1,000 mg total) rectally at bedtime.  . [DISCONTINUED] mesalamine (ROWASA) 4 g enema Place 60 mLs (4 g total) rectally at bedtime.  . [DISCONTINUED] vancomycin (VANCOCIN) 125 MG capsule Take 1 tab 3 times daily x 7 days, then twice daily x 7 days, then 1 daily x 7 days.   No facility-administered encounter medications on file as of 12/22/2015.    Allergies as of 12/22/2015 - Review Complete 12/22/2015    Allergen Reaction Noted  . Ace inhibitors Swelling 09/23/2015    Past Medical History  Diagnosis Date  . Hypertension   . Sleep apnea     cpap  . Colon polyps     2013  . Diverticulosis     Past Surgical History  Procedure Laterality Date  . No past surgeries      Family History  Problem Relation Age of Onset  . Colon cancer Neg Hx   . Esophageal cancer Neg Hx   . Stomach cancer Neg Hx   . Rectal cancer Neg Hx     Social History   Social History  . Marital Status: Single    Spouse Name: N/A  . Number of Children: N/A  . Years of Education: N/A   Occupational History  . Not on file.   Social History Main Topics  . Smoking status: Former Smoker    Types: Cigars    Quit date: 07/10/2013  . Smokeless tobacco: Never Used  . Alcohol Use: 8.4 oz/week    14 Cans of beer per week     Comment: was not drinking and taking Flagyl.   . Drug Use: No  . Sexual Activity: Not on file   Other Topics Concern  . Not on file   Social History Narrative      Review of systems: Review of Systems  Constitutional: Negative for fever and chills.  HENT: Negative.   Eyes: Negative for blurred vision.  Respiratory: Negative for cough, shortness of  breath and wheezing.   Cardiovascular: Negative for chest pain and palpitations.  Gastrointestinal: as per HPI Genitourinary: Negative for dysuria, urgency, frequency and hematuria.  Musculoskeletal: Negative for myalgias, back pain and joint pain.  Skin: Negative for itching and rash.  Neurological: Negative for dizziness, tremors, focal weakness, seizures and loss of consciousness.  Endo/Heme/Allergies: Negative for environmental allergies.  Psychiatric/Behavioral: Negative for depression, suicidal ideas and hallucinations.  All other systems reviewed and are negative.   Physical Exam: Filed Vitals:   12/22/15 0929  BP: 126/80  Pulse: 80   Gen:      No acute distress HEENT:  EOMI, sclera anicteric Neck:     No masses;  no thyromegaly Lungs:    Clear to auscultation bilaterally; normal respiratory effort CV:         Regular rate and rhythm; no murmurs Abd:      + bowel sounds; soft, non-tender; no palpable masses, no distension Ext:    No edema; adequate peripheral perfusion Skin:      Warm and dry; no rash Neuro: alert and oriented x 3 Psych: normal mood and affect  Data Reviewed:  Reviewed chart in epic   Assessment and Plan/Recommendations: 55 year old male with recent diagnosis of ulcerative colitis with evidence of active colitis in the rectosigmoid region in March 2017 and recurrent episodes of C. difficile is here for follow-up visit He completed vancomycin by mouth taper last week for recurrent C. difficile  Start Florastor and continue for a month Continue Lialda 4.8 g daily Start VSL#3 112 B units after completion of the month of Florastor to prevent recurrent C. Difficile Currently he appears to be asymptomatic and in remission from ulcerative colitis, will hold off starting chronic immunosuppressive therapy at this point unless he continues to have recurrent flares We'll check CBC, CMP, CRP, iron studies, B12/folate. Return in 6-12 months or sooner if needed   K. Denzil Magnuson , MD [phone] Mon-Fri 8a-5p [phone] after 5p, weekends, holidays  CC: Marton Redwood, MD

## 2015-12-22 NOTE — Patient Instructions (Addendum)
Use Florastor daily for 1 month Then afterwards use VSL #3 112U daily after that, which is purchased over the counter from your pharmacist Follow up 6-12 months

## 2016-02-02 ENCOUNTER — Other Ambulatory Visit: Payer: Self-pay

## 2016-02-02 ENCOUNTER — Telehealth: Payer: Self-pay | Admitting: Gastroenterology

## 2016-02-02 DIAGNOSIS — K51219 Ulcerative (chronic) proctitis with unspecified complications: Secondary | ICD-10-CM

## 2016-02-02 NOTE — Telephone Encounter (Signed)
Patient will come for labs tomorrow.  Appointment 02/04/16

## 2016-02-02 NOTE — Telephone Encounter (Signed)
Please schedule for office visit soon. Check CBC, CMP, & CRP. Please also check C.diff if having diarrhea. He has had C.Diff colitis in the past.

## 2016-02-02 NOTE — Telephone Encounter (Signed)
Jesse English developed acne around the mouth, a rash on his chest under his arms and nausea. He did not feel well. He decreased his dosage of Lialda to 1.2 g twice daily. These issues cleared up quickly. Past couple of days he has noted blood with the bowel movement. He says his stools are fine, just the blood and maybe a little cramping. What do you advise?

## 2016-02-03 ENCOUNTER — Other Ambulatory Visit (INDEPENDENT_AMBULATORY_CARE_PROVIDER_SITE_OTHER): Payer: 59

## 2016-02-03 DIAGNOSIS — K51219 Ulcerative (chronic) proctitis with unspecified complications: Secondary | ICD-10-CM

## 2016-02-03 LAB — CBC WITH DIFFERENTIAL/PLATELET
Basophils Absolute: 0 10*3/uL (ref 0.0–0.1)
Basophils Relative: 0.3 % (ref 0.0–3.0)
EOS PCT: 5.3 % — AB (ref 0.0–5.0)
Eosinophils Absolute: 0.4 10*3/uL (ref 0.0–0.7)
HCT: 43.6 % (ref 39.0–52.0)
Hemoglobin: 14.9 g/dL (ref 13.0–17.0)
LYMPHS ABS: 1.6 10*3/uL (ref 0.7–4.0)
Lymphocytes Relative: 24.8 % (ref 12.0–46.0)
MCHC: 34.2 g/dL (ref 30.0–36.0)
MCV: 95.3 fl (ref 78.0–100.0)
MONO ABS: 1 10*3/uL (ref 0.1–1.0)
Monocytes Relative: 14.7 % — ABNORMAL HIGH (ref 3.0–12.0)
NEUTROS ABS: 3.6 10*3/uL (ref 1.4–7.7)
NEUTROS PCT: 54.9 % (ref 43.0–77.0)
PLATELETS: 207 10*3/uL (ref 150.0–400.0)
RBC: 4.57 Mil/uL (ref 4.22–5.81)
RDW: 14.1 % (ref 11.5–15.5)
WBC: 6.6 10*3/uL (ref 4.0–10.5)

## 2016-02-03 LAB — COMPREHENSIVE METABOLIC PANEL
ALK PHOS: 39 U/L (ref 39–117)
ALT: 26 U/L (ref 0–53)
AST: 22 U/L (ref 0–37)
Albumin: 4.1 g/dL (ref 3.5–5.2)
BUN: 23 mg/dL (ref 6–23)
CHLORIDE: 100 meq/L (ref 96–112)
CO2: 29 meq/L (ref 19–32)
Calcium: 9.4 mg/dL (ref 8.4–10.5)
Creatinine, Ser: 1.48 mg/dL (ref 0.40–1.50)
GFR: 51.97 mL/min — AB (ref >60.00)
GLUCOSE: 105 mg/dL — AB (ref 70–99)
POTASSIUM: 3.7 meq/L (ref 3.5–5.1)
SODIUM: 138 meq/L (ref 135–145)
TOTAL PROTEIN: 7.8 g/dL (ref 6.0–8.3)
Total Bilirubin: 1 mg/dL (ref 0.2–1.2)

## 2016-02-03 LAB — C-REACTIVE PROTEIN: CRP: 0.6 mg/dL (ref 0.5–20.0)

## 2016-02-04 ENCOUNTER — Ambulatory Visit: Payer: 59 | Admitting: Gastroenterology

## 2016-02-04 ENCOUNTER — Other Ambulatory Visit: Payer: Self-pay

## 2016-02-04 LAB — CLOSTRIDIUM DIFFICILE BY PCR: Toxigenic C. Difficile by PCR: DETECTED — CR

## 2016-02-04 MED ORDER — VSL#3 PO CAPS: 1.0000 | ORAL_CAPSULE | Freq: Every day | ORAL | 3 refills | Status: DC

## 2016-02-04 MED ORDER — VANCOMYCIN HCL 125 MG PO CAPS: ORAL_CAPSULE | ORAL | 0 refills | Status: DC

## 2016-02-04 NOTE — Telephone Encounter (Signed)
Ok thanks 

## 2016-02-14 ENCOUNTER — Telehealth: Payer: Self-pay | Admitting: Gastroenterology

## 2016-02-14 NOTE — Telephone Encounter (Signed)
Patient rescheduled to his needs. He will call if he has any acute problems

## 2016-02-15 ENCOUNTER — Other Ambulatory Visit: Payer: Self-pay

## 2016-02-15 ENCOUNTER — Telehealth: Payer: Self-pay | Admitting: Gastroenterology

## 2016-02-15 MED ORDER — VANCOMYCIN HCL 125 MG PO CAPS: ORAL_CAPSULE | ORAL | 0 refills | Status: DC

## 2016-02-24 ENCOUNTER — Telehealth: Payer: Self-pay | Admitting: Gastroenterology

## 2016-02-24 MED ORDER — PREDNISONE 10 MG PO TABS: ORAL_TABLET | ORAL | 0 refills | Status: DC

## 2016-02-24 MED ORDER — MESALAMINE 4 G RE ENEM: 4.0000 g | ENEMA | Freq: Every day | RECTAL | 0 refills | Status: DC

## 2016-02-24 NOTE — Telephone Encounter (Signed)
Patient has completed his vancomycin taper.  His stools are solid, but he is having 4-5 stools a day with blood and some clots.  He questions if hs can use Rowasa enema enemas again?  He is currently is taking Lialda 1200 Mg daily due to side effects of 4800 mg dosage Amy Esterwood PA prescribed.  He stated gave his terrible acne.  Please advise next step

## 2016-02-24 NOTE — Telephone Encounter (Signed)
Patient notified He has follow up already scheduled for 03/16/16.  He will call back for any additional questions or concerns.

## 2016-02-24 NOTE — Telephone Encounter (Signed)
Ok to use Rowasa enema at bedtime daily x 2 weeks. Please advise him continue probiotic. Increase Lialda to 2.4gm. Start Prednisone 64m daily x 2weeks and slowly taper down 556mevery week. Please schedule follow up office visit ~ 2 weeks. If he continues to have increased bowel frequency and blood in stool, will have to consider starting 6MP or biologics

## 2016-03-03 ENCOUNTER — Ambulatory Visit: Payer: 59 | Admitting: Gastroenterology

## 2016-03-16 ENCOUNTER — Encounter: Payer: Self-pay | Admitting: Gastroenterology

## 2016-03-16 ENCOUNTER — Encounter (INDEPENDENT_AMBULATORY_CARE_PROVIDER_SITE_OTHER): Payer: Self-pay

## 2016-03-16 ENCOUNTER — Ambulatory Visit (INDEPENDENT_AMBULATORY_CARE_PROVIDER_SITE_OTHER): Payer: 59 | Admitting: Gastroenterology

## 2016-03-16 ENCOUNTER — Other Ambulatory Visit (INDEPENDENT_AMBULATORY_CARE_PROVIDER_SITE_OTHER): Payer: 59

## 2016-03-16 VITALS — BP 126/80 | HR 72 | Ht 71.0 in | Wt 237.1 lb

## 2016-03-16 DIAGNOSIS — Z789 Other specified health status: Secondary | ICD-10-CM | POA: Diagnosis not present

## 2016-03-16 DIAGNOSIS — K641 Second degree hemorrhoids: Secondary | ICD-10-CM

## 2016-03-16 DIAGNOSIS — K625 Hemorrhage of anus and rectum: Secondary | ICD-10-CM

## 2016-03-16 DIAGNOSIS — Z9189 Other specified personal risk factors, not elsewhere classified: Secondary | ICD-10-CM

## 2016-03-16 DIAGNOSIS — K5180 Other ulcerative colitis without complications: Secondary | ICD-10-CM

## 2016-03-16 LAB — CBC WITH DIFFERENTIAL/PLATELET
Basophils Absolute: 0 10*3/uL (ref 0.0–0.1)
Basophils Relative: 0.3 % (ref 0.0–3.0)
EOS PCT: 0.6 % (ref 0.0–5.0)
Eosinophils Absolute: 0.1 10*3/uL (ref 0.0–0.7)
HEMATOCRIT: 47.1 % (ref 39.0–52.0)
Hemoglobin: 16.3 g/dL (ref 13.0–17.0)
LYMPHS ABS: 0.9 10*3/uL (ref 0.7–4.0)
LYMPHS PCT: 9.3 % — AB (ref 12.0–46.0)
MCHC: 34.6 g/dL (ref 30.0–36.0)
MCV: 95.1 fl (ref 78.0–100.0)
MONOS PCT: 5.2 % (ref 3.0–12.0)
Monocytes Absolute: 0.5 10*3/uL (ref 0.1–1.0)
NEUTROS ABS: 8.3 10*3/uL — AB (ref 1.4–7.7)
NEUTROS PCT: 84.6 % — AB (ref 43.0–77.0)
PLATELETS: 219 10*3/uL (ref 150.0–400.0)
RBC: 4.95 Mil/uL (ref 4.22–5.81)
RDW: 14.1 % (ref 11.5–15.5)
WBC: 9.8 10*3/uL (ref 4.0–10.5)

## 2016-03-16 LAB — BASIC METABOLIC PANEL
BUN: 28 mg/dL — AB (ref 6–23)
CALCIUM: 9.7 mg/dL (ref 8.4–10.5)
CO2: 33 mEq/L — ABNORMAL HIGH (ref 19–32)
Chloride: 100 mEq/L (ref 96–112)
Creatinine, Ser: 1.27 mg/dL (ref 0.40–1.50)
GFR: 61.98 mL/min (ref >60.00)
GLUCOSE: 122 mg/dL — AB (ref 70–99)
POTASSIUM: 5.2 meq/L — AB (ref 3.5–5.1)
SODIUM: 140 meq/L (ref 135–145)

## 2016-03-16 LAB — SEDIMENTATION RATE: SED RATE: 11 mm/h (ref 0–20)

## 2016-03-16 LAB — HIGH SENSITIVITY CRP: CRP, High Sensitivity: 2 mg/L (ref 0.000–5.000)

## 2016-03-16 NOTE — Progress Notes (Signed)
Jesse English    [id number]    January 31, 1961  Primary Care Physician:Shaw, Gwyndolyn Saxon, MD  Referring Physician: Marton Redwood, MD [address] [place], [place] [address]  Chief complaint:  UC, recurrent C.diff HPI: 55 year old male with left-sided ulcerative colitis diagnosed in March 2017 in the setting of C. difficile colitis and subsequent flare in the setting of recurrent c.diff colitis in April 2017 and July 2017 treated with prolonged Vancomycin taper is here for follow up visit. He was on a brief steroid taper and also Rowasa enema that he completed 10 days ago.   He is feeling better with decreased stool frequency and but contiues to have intermittent BRBPR when he wipes but not passing any clots or blood mixed in stool.  He has 2-3 semi-formed bowel movements a day. He is taking Lialda 2.4 g daily. He had ?acne/rash with higher dose lialda 4.8gm.  He is taking probiotic daily. No longer feels fatigued and is able to exercise. Back pain is worse and thinks he may need to undergo surgery. Denies any nausea, vomiting, abdominal pain, melena or bright red blood per rectum    Outpatient Encounter Prescriptions as of 03/16/2016  Medication Sig  . mesalamine (LIALDA) 1.2 g EC tablet Take 1.2 g by mouth daily with breakfast.  . Probiotic Product (VSL#3) CAPS Take 1 capsule by mouth daily. Begin after 2nd week of Vancomycin  . [DISCONTINUED] mesalamine (ROWASA) 4 g enema Place 60 mLs (4 g total) rectally at bedtime.  . [DISCONTINUED] predniSONE (DELTASONE) 10 MG tablet Take 20 mg by mouth for 2 weeks, 10 mg for 1 week, 5 mg for 1 week then stop  . [DISCONTINUED] saccharomyces boulardii (FLORASTOR) 250 MG capsule Take 1 capsule (250 mg total) by mouth 2 (two) times daily.  . [DISCONTINUED] vancomycin (VANCOCIN HCL) 125 MG capsule Take 1 capsule daily   No facility-administered encounter medications on file as of 03/16/2016.     Allergies as of 03/16/2016 - Review Complete 12/22/2015   Allergen Reaction Noted  . Ace inhibitors Swelling 09/23/2015    Past Medical History:  Diagnosis Date  . Colon polyps    2013  . Diverticulosis   . Hypertension   . Sleep apnea    cpap    Past Surgical History:  Procedure Laterality Date  . NO PAST SURGERIES      Family History  Problem Relation Age of Onset  . Colon cancer Neg Hx   . Esophageal cancer Neg Hx   . Stomach cancer Neg Hx   . Rectal cancer Neg Hx     Social History   Social History  . Marital status: Single    Spouse name: N/A  . Number of children: N/A  . Years of education: N/A   Occupational History  . Not on file.   Social History Main Topics  . Smoking status: Former Smoker    Types: Cigars    Quit date: 07/10/2013  . Smokeless tobacco: Never Used  . Alcohol use 8.4 oz/week    14 Cans of beer per week     Comment: 2 per day  . Drug use: No  . Sexual activity: Not on file   Other Topics Concern  . Not on file   Social History Narrative  . No narrative on file      Review of systems: Review of Systems  Constitutional: Negative for fever and chills.  HENT: Negative.   Eyes: Negative  for blurred vision.  Respiratory: Negative for cough, shortness of breath and wheezing.   Cardiovascular: Negative for chest pain and palpitations.  Gastrointestinal: as per HPI Genitourinary: Negative for dysuria, urgency, frequency and hematuria.  Musculoskeletal: Negative for myalgias, back pain and joint pain.  Skin: Positive for itching and rash.  Neurological: Negative for dizziness, tremors, focal weakness, seizures and loss of consciousness.  Endo/Heme/Allergies: Positive for seasonal allergies.  Psychiatric/Behavioral: Negative for depression, suicidal ideas and hallucinations.  All other systems reviewed and are negative.   Physical Exam: Vitals:   03/16/16 0857  BP: 126/80  Pulse: 72   Body mass index is 33.07 kg/m. Gen:      No acute distress HEENT:  EOMI, sclera  anicteric Neck:     No masses; no thyromegaly Lungs:    Clear to auscultation bilaterally; normal respiratory effort CV:         Regular rate and rhythm; no murmurs Abd:      + bowel sounds; soft, non-tender; no palpable masses, no distension Ext:    No edema; adequate peripheral perfusion Skin:      Warm and dry; no rash Neuro: alert and oriented x 3 Psych: normal mood and affect  Data Reviewed:  Reviewed chart in epic   Assessment and Plan/Recommendations:  55 year old male with ulcerative colitis flare in the setting of recurrent C. Difficile, improved with prolonged vancomycin taper. He is currently on probiotics Advised patient to take VSL#3 1 capsule twice daily for next 3 months Continue Lialda 2.4 g daily, he may require higher dose of mesalamine to maintain remission Patient was given samples of Pentasa, Apriso and Delzicol to try separately to see if he tolerates them better and he'll call us to let us know which formulary he likes better to send the Rx Small-volume intermittent bright red blood per rectum likely secondary to internal hemorrhoids bleed, we'll schedule for hemorrhoidal band ligation in 6-8 weeks We'll check labs, CBC, CMP, ESR/CRP  25 minutes was spent face-to-face with the patient. Greater than 50% of the time used for counseling as well as treatment plan and follow-up. He had multiple questions which were answered to his satisfaction  K. Denzil Magnuson , MD [phone] Mon-Fri 8a-5p [phone] after 5p, weekends, holidays  CC: Marton Redwood, MD

## 2016-03-16 NOTE — Patient Instructions (Addendum)
We are giving you samples of Pentasa,Delzicol,Apriso   Pentasa 1 gm three times a day Apriso 4 capsules daily Delzicol 800mg  three times a day   After completing these samples please contact our office for prescription of which one suits you best  Your 1st hemorrhoidal banding is scheduled on 05/30/2016 at 4pm  Go to the basement for labs today   Use VSL #3 1 capsule three times a day

## 2016-03-30 ENCOUNTER — Telehealth: Payer: Self-pay | Admitting: Gastroenterology

## 2016-03-30 NOTE — Telephone Encounter (Signed)
Patient reports his right eye was swollen and painful a few weeks ago. He saw his PCP who put him on prednisone. He was told it was not likely to be a drug reaction. He completed the prednisone. Today the left eye is swollen and the right eye is better. Encouraged to contact his PCP again. Doesn't sound like a reaction to Mesalamine, but better to have it seen by a doctor.

## 2016-03-30 NOTE — Telephone Encounter (Signed)
Unlikely to be a reaction to mesalamine (Delzicol) especially given only one eye was involved and now the other eye is affected. ? Conjunctivitis, he'll need to follow up with PMD/ophthalmologist

## 2016-04-07 ENCOUNTER — Telehealth: Payer: Self-pay | Admitting: *Deleted

## 2016-04-07 NOTE — Telephone Encounter (Signed)
Patient with [facility] Giving Dr Silverio Decamp an update. He has taken Lialda, Pentasa and Enemas in the past. Was recently given Apriso. Thinks that the Dorthy Cooler is not working for him at all. Lialda works okay but Pentasa and Enemas seem to work the best. Would like advice on which medication to use.

## 2016-04-08 NOTE — Telephone Encounter (Signed)
Please send Rx for Penatasa 1 gm four times daily and Rowasa enema at bedtime x 4 weeks. Please arrange for office follow up in 2-3 weeks. Thanks

## 2016-04-09 DIAGNOSIS — N493 Fournier gangrene: Secondary | ICD-10-CM

## 2016-04-09 DIAGNOSIS — K611 Rectal abscess: Secondary | ICD-10-CM

## 2016-04-09 HISTORY — DX: Rectal abscess: K61.1

## 2016-04-09 HISTORY — DX: Fournier gangrene: N49.3

## 2016-04-10 ENCOUNTER — Other Ambulatory Visit: Payer: Self-pay

## 2016-04-10 MED ORDER — ROWASA 4 G RE KIT: 1.0000 | PACK | Freq: Every day | RECTAL | 0 refills | Status: DC

## 2016-04-10 MED ORDER — MESALAMINE ER 500 MG PO CPCR: 1000.0000 mg | ORAL_CAPSULE | Freq: Four times a day (QID) | ORAL | 2 refills | Status: DC

## 2016-04-10 NOTE — Telephone Encounter (Signed)
I would prefer if he can avoid NSAID or using very sparingly. HCTZ can also precipitate gout, please advise patient to discuss with PMD to prescribe alternative anti hypertensive. He needs to stay on Mesalamine to prevent recurrent flare of Ulcerative colitis.

## 2016-04-10 NOTE — Telephone Encounter (Signed)
This information has been given to the assistant of dr Marton Redwood. Message left for her.

## 2016-04-10 NOTE — Telephone Encounter (Signed)
Called the patient to tell him of the medications. Patient now tells me he has had an occurrence of gout. New diagnosis for him. He is also on HCTZ for BP management. Ortho has him taking Indomethacin for a few more days. He was told by Ortho there is an interaction between Mesalamine and HCTZ. Are there any concerns here?

## 2016-04-15 ENCOUNTER — Emergency Department (HOSPITAL_COMMUNITY)
Admission: EM | Admit: 2016-04-15 | Discharge: 2016-04-15 | Disposition: A | Discharge status: Home / Self Care | Payer: 59 | Attending: Emergency Medicine | Admitting: Emergency Medicine

## 2016-04-15 ENCOUNTER — Encounter (HOSPITAL_COMMUNITY): Payer: Self-pay | Admitting: *Deleted

## 2016-04-15 DIAGNOSIS — R339 Retention of urine, unspecified: Secondary | ICD-10-CM | POA: Insufficient documentation

## 2016-04-15 DIAGNOSIS — Z87891 Personal history of nicotine dependence: Secondary | ICD-10-CM | POA: Insufficient documentation

## 2016-04-15 DIAGNOSIS — I10 Essential (primary) hypertension: Secondary | ICD-10-CM | POA: Insufficient documentation

## 2016-04-15 DIAGNOSIS — Z79899 Other long term (current) drug therapy: Secondary | ICD-10-CM | POA: Diagnosis not present

## 2016-04-15 LAB — CBC WITH DIFFERENTIAL/PLATELET
Basophils Absolute: 0 10*3/uL (ref 0.0–0.1)
Basophils Relative: 0 %
EOS ABS: 0 10*3/uL (ref 0.0–0.7)
Eosinophils Relative: 0 %
HCT: 42.5 % (ref 39.0–52.0)
HEMOGLOBIN: 14.4 g/dL (ref 13.0–17.0)
LYMPHS PCT: 10 %
Lymphs Abs: 1.7 10*3/uL (ref 0.7–4.0)
MCH: 32.1 pg (ref 26.0–34.0)
MCHC: 33.9 g/dL (ref 30.0–36.0)
MCV: 94.7 fL (ref 78.0–100.0)
Monocytes Absolute: 2.5 10*3/uL — ABNORMAL HIGH (ref 0.1–1.0)
Monocytes Relative: 15 %
NEUTROS ABS: 12.6 10*3/uL — AB (ref 1.7–7.7)
NEUTROS PCT: 75 %
Platelets: 263 10*3/uL (ref 150–400)
RBC: 4.49 MIL/uL (ref 4.22–5.81)
RDW: 12.4 % (ref 11.5–15.5)
WBC: 16.8 10*3/uL — ABNORMAL HIGH (ref 4.0–10.5)

## 2016-04-15 LAB — BASIC METABOLIC PANEL
Anion gap: 10 (ref 5–15)
BUN: 17 mg/dL (ref 6–20)
CALCIUM: 9 mg/dL (ref 8.9–10.3)
CHLORIDE: 102 mmol/L (ref 101–111)
CO2: 24 mmol/L (ref 22–32)
CREATININE: 1.51 mg/dL — AB (ref 0.61–1.24)
GFR calc Af Amer: 57 mL/min — ABNORMAL LOW (ref >60)
GFR calc non Af Amer: 50 mL/min — ABNORMAL LOW (ref >60)
Glucose, Bld: 139 mg/dL — ABNORMAL HIGH (ref 65–99)
Potassium: 3.9 mmol/L (ref 3.5–5.1)
SODIUM: 136 mmol/L (ref 135–145)

## 2016-04-15 LAB — URINE MICROSCOPIC-ADD ON
BACTERIA UA: NONE SEEN
Squamous Epithelial / LPF: NONE SEEN

## 2016-04-15 LAB — URINALYSIS, ROUTINE W REFLEX MICROSCOPIC
BILIRUBIN URINE: NEGATIVE
GLUCOSE, UA: NEGATIVE mg/dL
Ketones, ur: NEGATIVE mg/dL
Leukocytes, UA: NEGATIVE
Nitrite: NEGATIVE
PH: 6 (ref 5.0–8.0)
Protein, ur: 30 mg/dL — AB
SPECIFIC GRAVITY, URINE: 1.02 (ref 1.005–1.030)

## 2016-04-15 MED ORDER — TAMSULOSIN HCL 0.4 MG PO CAPS: 0.4000 mg | ORAL_CAPSULE | Freq: Every day | ORAL | 0 refills | Status: DC

## 2016-04-15 NOTE — ED Notes (Signed)
Urinary Bag changed to Leg beg.

## 2016-04-15 NOTE — ED Triage Notes (Signed)
Had rectal surgery on Thursday, reports having difficulty urinating now. reports not able to urinate since 2:00 yesterday.

## 2016-04-15 NOTE — ED Notes (Signed)
MD Knapp at the bedside  

## 2016-04-15 NOTE — ED Notes (Signed)
Pt given water to drink per Roselyn Reef, RN

## 2016-04-15 NOTE — ED Provider Notes (Signed)
[facility] DEPT Provider Note   CSN: [id number] Arrival date & time: 04/15/16  0725     History   Chief Complaint Chief Complaint  Patient presents with  . Urinary Retention    HPI Jesse English is a 55 y.o. male.  HPI The patient has a complicated medical history that includes ulcerative colitis associated with perianal fistula. Patient has had a recent flare versus colitis. Managing as an outpatient. He is currently taking mesalamine.  That seems to be doing better recently and he is having less frequent stools. He did have a fistula procedure last week by Dr. Barry Dienes. He continues to have drainage but is having persistent pain. Since about 2 AM, he has not been able to empty his bladder properly. Patient feels very full and distended. He spoke with his doctor who suggested he come to the emergency room to get a Foley catheter placed. No fevers or vomiting.  Past Medical History:  Diagnosis Date  . Colon polyps    2013  . Diverticulosis   . Hypertension   . Sleep apnea    cpap    Patient Active Problem List   Diagnosis Date Noted  . Ulcerative colitis without complications ([place]) 70/35/0093    Past Surgical History:  Procedure Laterality Date  . ANAL FISSURE REPAIR    . NO PAST SURGERIES         Home Medications    Prior to Admission medications   Medication Sig Start Date End Date Taking? Authorizing Provider  amLODipine (NORVASC) 5 MG tablet Take 5 mg by mouth daily. 04/13/16  Yes Historical Provider, MD  doxycycline (VIBRAMYCIN) 100 MG capsule Take 100 mg by mouth 2 (two) times daily. 04/14/16  Yes Historical Provider, MD  LIALDA 1.2 g EC tablet Take 2.4 g by mouth 2 (two) times daily.  02/24/16  Yes Historical Provider, MD  mesalamine (PENTASA) 500 MG CR capsule Take 2 capsules (1,000 mg total) by mouth 4 (four) times daily. 04/10/16  Yes Mauri Pole, MD  Mesalamine-Cleanser (ROWASA) 4 g KIT Place 1 kit (4 g total) rectally at bedtime. 04/10/16  Yes  Mauri Pole, MD  Probiotic Product (VSL#3) CAPS Take 1 capsule by mouth daily. Begin after 2nd week of Vancomycin 02/04/16  Yes Mauri Pole, MD  tamsulosin (FLOMAX) 0.4 MG CAPS capsule Take 1 capsule (0.4 mg total) by mouth daily. 04/15/16   Dorie Rank, MD    Family History Family History  Problem Relation Age of Onset  . Colon cancer Neg Hx   . Esophageal cancer Neg Hx   . Stomach cancer Neg Hx   . Rectal cancer Neg Hx     Social History Social History  Substance Use Topics  . Smoking status: Former Smoker    Types: Cigars    Quit date: 07/10/2013  . Smokeless tobacco: Never Used  . Alcohol use 8.4 oz/week    14 Cans of beer per week     Comment: 2 per day     Allergies   Ace inhibitors   Review of Systems Review of Systems  All other systems reviewed and are negative.    Physical Exam Updated Vital Signs BP 132/84 (BP Location: Right Arm)   Pulse 94   Temp 99.6 F (37.6 C) (Oral)   Resp 18   SpO2 97%   Physical Exam  Constitutional: He appears well-developed and well-nourished. No distress.  HENT:  Head: Normocephalic and atraumatic.  Right Ear: External ear normal.  Left Ear: External ear normal.  Eyes: Conjunctivae are normal. Right eye exhibits no discharge. Left eye exhibits no discharge. No scleral icterus.  Neck: Neck supple. No tracheal deviation present.  Cardiovascular: Normal rate, regular rhythm and intact distal pulses.   Pulmonary/Chest: Effort normal and breath sounds normal. No stridor. No respiratory distress. He has no wheezes. He has no rales.  Abdominal: Soft. Bowel sounds are normal. He exhibits no distension. There is tenderness (suprapubic region). There is no rebound and no guarding.  Genitourinary:  Genitourinary Comments: Perianal abscess that has a piece of gauze packing, draining brown bloody fluid, no induration, no fluctuance, no surrounding erythema  Musculoskeletal: He exhibits no edema or tenderness.  Neurological:  He is alert. He has normal strength. No cranial nerve deficit (no facial droop, extraocular movements intact, no slurred speech) or sensory deficit. He exhibits normal muscle tone. He displays no seizure activity. Coordination normal.  Skin: Skin is warm and dry. No rash noted.  Psychiatric: He has a normal mood and affect.  Nursing note and vitals reviewed.    ED Treatments / Results  Labs (all labs ordered are listed, but only abnormal results are displayed) Labs Reviewed  BASIC METABOLIC PANEL - Abnormal; Notable for the following:       Result Value   Glucose, Bld 139 (*)    Creatinine, Ser 1.51 (*)    GFR calc non Af Amer 50 (*)    GFR calc Af Amer 57 (*)    All other components within normal limits  CBC WITH DIFFERENTIAL/PLATELET - Abnormal; Notable for the following:    WBC 16.8 (*)    Neutro Abs 12.6 (*)    Monocytes Absolute 2.5 (*)    All other components within normal limits  URINALYSIS, ROUTINE W REFLEX MICROSCOPIC (NOT AT [facility]) - Abnormal; Notable for the following:    Color, Urine ORANGE (*)    Hgb urine dipstick TRACE (*)    Protein, ur 30 (*)    All other components within normal limits  URINE MICROSCOPIC-ADD ON    Procedures Procedures (including critical care time)    Initial Impression / Assessment and Plan / ED Course  I have reviewed the triage vital signs and the nursing notes.  Pertinent labs & imaging results that were available during my care of the patient were reviewed by me and considered in my medical decision making (see chart for details).  Clinical Course    Pt had a foley catheter placed.  Large volume of urine  Drained (see nursing notes).  Sx resolved after placement of the catheter.  Slight increase in creatinine.  His white blood cell count is elevated but I think this is related to his current fistula issue.  We'll discharge home with a Foley catheter and leg bag. I will start him on Flomax. Follow up with urology.  Final Clinical  Impressions(s) / ED Diagnoses   Final diagnoses:  Urinary retention    New Prescriptions New Prescriptions   TAMSULOSIN (FLOMAX) 0.4 MG CAPS CAPSULE    Take 1 capsule (0.4 mg total) by mouth daily.     Dorie Rank, MD 04/16/16 [phone]

## 2016-04-19 ENCOUNTER — Telehealth: Payer: Self-pay | Admitting: Gastroenterology

## 2016-04-19 ENCOUNTER — Other Ambulatory Visit: Payer: Self-pay

## 2016-04-19 DIAGNOSIS — K51214 Ulcerative (chronic) proctitis with abscess: Secondary | ICD-10-CM

## 2016-04-19 DIAGNOSIS — K611 Rectal abscess: Secondary | ICD-10-CM

## 2016-04-19 MED ORDER — METRONIDAZOLE 500 MG PO TABS: 500.0000 mg | ORAL_TABLET | Freq: Three times a day (TID) | ORAL | 0 refills | Status: DC

## 2016-04-19 NOTE — Telephone Encounter (Signed)
He developed an anal fissure and abscess. Saw his PCP, who sent him to Dr Barry Dienes. Abscess was lanced last Friday. Then he developed urinary retention and has a catheter. Will follow up with urology. Now he has had an increase in bowel movements. They are loose. He is unable to control them. He has no sphincter control due to the lancing and healing abscess.  He is on Lialda and VSL. Unable to retain enemas. What should be done now?

## 2016-04-19 NOTE — Telephone Encounter (Signed)
Please do stat C.diff. Schedule for flex sig tomorrow to evaluate. I will have to discuss with patient about starting biologics.

## 2016-04-19 NOTE — Telephone Encounter (Signed)
Patient is "flat on my back". He is unable to come in. Per Dr Silverio Decamp he will begin Flagyl 500 mg TID. Urgent scheduling of flexible sigmoidoscopy tomorrow. Patient agrees to this plan of care.

## 2016-04-19 NOTE — Telephone Encounter (Signed)
Change of plans. The patient is unable to get up and down from the bed. He has difficulty sitting up due to the pain and he is leaking stool. He is wearing an adult diaper at this point. Presently thinks he is afebrile.

## 2016-04-20 ENCOUNTER — Encounter (HOSPITAL_COMMUNITY): Payer: Self-pay | Admitting: Emergency Medicine

## 2016-04-20 ENCOUNTER — Emergency Department (HOSPITAL_COMMUNITY): Payer: 59

## 2016-04-20 ENCOUNTER — Other Ambulatory Visit: Payer: 59 | Admitting: Gastroenterology

## 2016-04-20 ENCOUNTER — Inpatient Hospital Stay (HOSPITAL_COMMUNITY)
Admission: EM | Admit: 2016-04-20 | Discharge: 2016-04-24 | DRG: 982 | Disposition: A | Discharge status: Home Health | Payer: 59 | Attending: Surgery | Admitting: Surgery

## 2016-04-20 DIAGNOSIS — K6289 Other specified diseases of anus and rectum: Secondary | ICD-10-CM | POA: Diagnosis not present

## 2016-04-20 DIAGNOSIS — R338 Other retention of urine: Secondary | ICD-10-CM

## 2016-04-20 DIAGNOSIS — K529 Noninfective gastroenteritis and colitis, unspecified: Secondary | ICD-10-CM | POA: Diagnosis not present

## 2016-04-20 DIAGNOSIS — N493 Fournier gangrene: Secondary | ICD-10-CM | POA: Diagnosis not present

## 2016-04-20 DIAGNOSIS — D72829 Elevated white blood cell count, unspecified: Secondary | ICD-10-CM

## 2016-04-20 DIAGNOSIS — K648 Other hemorrhoids: Secondary | ICD-10-CM | POA: Diagnosis present

## 2016-04-20 DIAGNOSIS — K5090 Crohn's disease, unspecified, without complications: Secondary | ICD-10-CM | POA: Diagnosis present

## 2016-04-20 DIAGNOSIS — Z888 Allergy status to other drugs, medicaments and biological substances status: Secondary | ICD-10-CM

## 2016-04-20 DIAGNOSIS — Z87891 Personal history of nicotine dependence: Secondary | ICD-10-CM

## 2016-04-20 DIAGNOSIS — R339 Retention of urine, unspecified: Secondary | ICD-10-CM | POA: Diagnosis not present

## 2016-04-20 DIAGNOSIS — K5190 Ulcerative colitis, unspecified, without complications: Secondary | ICD-10-CM | POA: Diagnosis present

## 2016-04-20 DIAGNOSIS — M109 Gout, unspecified: Secondary | ICD-10-CM | POA: Diagnosis present

## 2016-04-20 DIAGNOSIS — G4730 Sleep apnea, unspecified: Secondary | ICD-10-CM | POA: Diagnosis present

## 2016-04-20 DIAGNOSIS — K50114 Crohn's disease of large intestine with abscess: Secondary | ICD-10-CM | POA: Diagnosis not present

## 2016-04-20 DIAGNOSIS — K611 Rectal abscess: Secondary | ICD-10-CM | POA: Diagnosis not present

## 2016-04-20 DIAGNOSIS — I10 Essential (primary) hypertension: Secondary | ICD-10-CM | POA: Diagnosis present

## 2016-04-20 DIAGNOSIS — Z86010 Personal history of colonic polyps: Secondary | ICD-10-CM

## 2016-04-20 HISTORY — DX: Fournier gangrene: N49.3

## 2016-04-20 HISTORY — DX: Fourth degree hemorrhoids: K64.3

## 2016-04-20 HISTORY — DX: Rectal abscess: K61.1

## 2016-04-20 HISTORY — DX: Crohn's disease of large intestine without complications: K50.10

## 2016-04-20 HISTORY — DX: Gout, unspecified: M10.9

## 2016-04-20 LAB — I-STAT CHEM 8, ED
BUN: 20 mg/dL (ref 6–20)
Calcium, Ion: 1.08 mmol/L — ABNORMAL LOW (ref 1.15–1.40)
Chloride: 101 mmol/L (ref 101–111)
Creatinine, Ser: 1.2 mg/dL (ref 0.61–1.24)
Glucose, Bld: 104 mg/dL — ABNORMAL HIGH (ref 65–99)
HEMATOCRIT: 45 % (ref 39.0–52.0)
HEMOGLOBIN: 15.3 g/dL (ref 13.0–17.0)
POTASSIUM: 3.7 mmol/L (ref 3.5–5.1)
Sodium: 136 mmol/L (ref 135–145)
TCO2: 23 mmol/L (ref 0–100)

## 2016-04-20 LAB — TYPE AND SCREEN
ABO/RH(D): O POS
ANTIBODY SCREEN: NEGATIVE

## 2016-04-20 LAB — C DIFFICILE QUICK SCREEN W PCR REFLEX
C DIFFICILE (CDIFF) INTERP: NOT DETECTED
C DIFFICILE (CDIFF) TOXIN: NEGATIVE
C DIFFICLE (CDIFF) ANTIGEN: NEGATIVE

## 2016-04-20 LAB — I-STAT CG4 LACTIC ACID, ED
LACTIC ACID, VENOUS: 1.18 mmol/L (ref 0.5–1.9)
LACTIC ACID, VENOUS: 1.3 mmol/L (ref 0.5–1.9)

## 2016-04-20 LAB — PROTIME-INR
INR: 1.26
Prothrombin Time: 15.9 seconds — ABNORMAL HIGH (ref 11.4–15.2)

## 2016-04-20 LAB — CBC
HEMATOCRIT: 41.6 % (ref 39.0–52.0)
Hemoglobin: 14.2 g/dL (ref 13.0–17.0)
MCH: 32 pg (ref 26.0–34.0)
MCHC: 34.1 g/dL (ref 30.0–36.0)
MCV: 93.7 fL (ref 78.0–100.0)
PLATELETS: 341 10*3/uL (ref 150–400)
RBC: 4.44 MIL/uL (ref 4.22–5.81)
RDW: 12.8 % (ref 11.5–15.5)
WBC: 13.8 10*3/uL — ABNORMAL HIGH (ref 4.0–10.5)

## 2016-04-20 LAB — COMPREHENSIVE METABOLIC PANEL
ALBUMIN: 2.8 g/dL — AB (ref 3.5–5.0)
ALT: 45 U/L (ref 17–63)
AST: 30 U/L (ref 15–41)
Alkaline Phosphatase: 44 U/L (ref 38–126)
Anion gap: 9 (ref 5–15)
BILIRUBIN TOTAL: 1.1 mg/dL (ref 0.3–1.2)
BUN: 22 mg/dL — AB (ref 6–20)
CO2: 22 mmol/L (ref 22–32)
CREATININE: 1.15 mg/dL (ref 0.61–1.24)
Calcium: 8.4 mg/dL — ABNORMAL LOW (ref 8.9–10.3)
Chloride: 102 mmol/L (ref 101–111)
GFR calc Af Amer: >60 mL/min (ref >60)
GFR calc non Af Amer: >60 mL/min (ref >60)
GLUCOSE: 106 mg/dL — AB (ref 65–99)
POTASSIUM: 3.7 mmol/L (ref 3.5–5.1)
Sodium: 133 mmol/L — ABNORMAL LOW (ref 135–145)
TOTAL PROTEIN: 7.5 g/dL (ref 6.5–8.1)

## 2016-04-20 LAB — ABO/RH: ABO/RH(D): O POS

## 2016-04-20 LAB — LIPASE, BLOOD: Lipase: 20 U/L (ref 11–51)

## 2016-04-20 MED ORDER — VANCOMYCIN HCL 10 G IV SOLR
2000.0000 mg | Freq: Once | INTRAVENOUS | Status: AC
  Administered 2016-04-20: 2000 mg via INTRAVENOUS
  Filled 2016-04-20: qty 2000

## 2016-04-20 MED ORDER — PEG-KCL-NACL-NASULF-NA ASC-C 100 G PO SOLR
0.5000 | Freq: Once | ORAL | Status: DC
  Filled 2016-04-20: qty 1

## 2016-04-20 MED ORDER — ACETAMINOPHEN 650 MG RE SUPP
650.0000 mg | Freq: Four times a day (QID) | RECTAL | Status: DC | PRN
Start: 2016-04-20 — End: 2016-04-22

## 2016-04-20 MED ORDER — ACETAMINOPHEN 325 MG PO TABS: 650.0000 mg | ORAL_TABLET | Freq: Four times a day (QID) | ORAL | Status: DC | PRN

## 2016-04-20 MED ORDER — IOPAMIDOL (ISOVUE-300) INJECTION 61%
30.0000 mL | Freq: Once | INTRAVENOUS | Status: AC | PRN
Start: 2016-04-20 — End: 2016-04-20
  Administered 2016-04-20: 30 mL via ORAL

## 2016-04-20 MED ORDER — SODIUM CHLORIDE 0.45 % IV SOLN
INTRAVENOUS | Status: DC
  Administered 2016-04-20 – 2016-04-22 (×3): via INTRAVENOUS

## 2016-04-20 MED ORDER — IOPAMIDOL (ISOVUE-300) INJECTION 61%
100.0000 mL | Freq: Once | INTRAVENOUS | Status: AC | PRN
  Administered 2016-04-20: 100 mL via INTRAVENOUS

## 2016-04-20 MED ORDER — ENOXAPARIN SODIUM 40 MG/0.4ML ~~LOC~~ SOLN: 40.0000 mg | SUBCUTANEOUS | Status: DC

## 2016-04-20 MED ORDER — COLCHICINE 0.6 MG PO TABS
0.6000 mg | ORAL_TABLET | Freq: Once | ORAL | Status: AC
  Administered 2016-04-20: 0.6 mg via ORAL
  Filled 2016-04-20 (×2): qty 1

## 2016-04-20 MED ORDER — ONDANSETRON 4 MG PO TBDP: 4.0000 mg | ORAL_TABLET | Freq: Four times a day (QID) | ORAL | Status: DC | PRN

## 2016-04-20 MED ORDER — VANCOMYCIN HCL IN DEXTROSE 750-5 MG/150ML-% IV SOLN
750.0000 mg | Freq: Two times a day (BID) | INTRAVENOUS | Status: DC
  Administered 2016-04-21 – 2016-04-23 (×5): 750 mg via INTRAVENOUS
  Filled 2016-04-20 (×5): qty 150

## 2016-04-20 MED ORDER — DIPHENHYDRAMINE HCL 25 MG PO CAPS: 25.0000 mg | ORAL_CAPSULE | Freq: Four times a day (QID) | ORAL | Status: DC | PRN

## 2016-04-20 MED ORDER — POLYETHYLENE GLYCOL 3350 17 G PO PACK: 17.0000 g | PACK | Freq: Every day | ORAL | Status: DC | PRN

## 2016-04-20 MED ORDER — PIPERACILLIN-TAZOBACTAM 3.375 G IVPB
3.3750 g | Freq: Three times a day (TID) | INTRAVENOUS | Status: DC
  Administered 2016-04-21 – 2016-04-24 (×11): 3.375 g via INTRAVENOUS
  Filled 2016-04-20 (×12): qty 50

## 2016-04-20 MED ORDER — HYDROMORPHONE HCL 1 MG/ML IJ SOLN
1.0000 mg | Freq: Once | INTRAMUSCULAR | Status: DC
  Filled 2016-04-20: qty 1

## 2016-04-20 MED ORDER — METHOCARBAMOL 500 MG PO TABS: 500.0000 mg | ORAL_TABLET | Freq: Four times a day (QID) | ORAL | Status: DC | PRN

## 2016-04-20 MED ORDER — DIPHENHYDRAMINE HCL 50 MG/ML IJ SOLN: 25.0000 mg | Freq: Four times a day (QID) | INTRAMUSCULAR | Status: DC | PRN

## 2016-04-20 MED ORDER — SODIUM CHLORIDE 0.9 % IV SOLN
INTRAVENOUS | Status: DC
  Administered 2016-04-20: 20 mL via INTRAVENOUS

## 2016-04-20 MED ORDER — BISACODYL 5 MG PO TBEC: 5.0000 mg | DELAYED_RELEASE_TABLET | Freq: Every day | ORAL | Status: DC | PRN

## 2016-04-20 MED ORDER — COLCHICINE 0.6 MG PO TABS
1.2000 mg | ORAL_TABLET | Freq: Once | ORAL | Status: AC
  Administered 2016-04-20: 1.2 mg via ORAL
  Filled 2016-04-20: qty 2

## 2016-04-20 MED ORDER — OXYCODONE HCL 5 MG PO TABS
5.0000 mg | ORAL_TABLET | ORAL | Status: DC | PRN
  Administered 2016-04-22 (×2): 5 mg via ORAL
  Filled 2016-04-20 (×2): qty 1

## 2016-04-20 MED ORDER — ONDANSETRON HCL 4 MG/2ML IJ SOLN
4.0000 mg | Freq: Four times a day (QID) | INTRAMUSCULAR | Status: DC | PRN
  Administered 2016-04-21: 4 mg via INTRAVENOUS

## 2016-04-20 MED ORDER — PEG-KCL-NACL-NASULF-NA ASC-C 100 G PO SOLR: 1.0000 | Freq: Once | ORAL | Status: DC

## 2016-04-20 MED ORDER — HYDROMORPHONE HCL 1 MG/ML IJ SOLN: 0.5000 mg | INTRAMUSCULAR | Status: DC | PRN

## 2016-04-20 MED ORDER — PIPERACILLIN-TAZOBACTAM 3.375 G IVPB 30 MIN
3.3750 g | Freq: Once | INTRAVENOUS | Status: AC
  Administered 2016-04-20: 3.375 g via INTRAVENOUS
  Filled 2016-04-20: qty 50

## 2016-04-20 MED ORDER — INFLUENZA VAC SPLIT QUAD 0.5 ML IM SUSY: 0.5000 mL | PREFILLED_SYRINGE | INTRAMUSCULAR | Status: DC

## 2016-04-20 NOTE — ED Notes (Signed)
Patient has a hx of ulcerative colitis.

## 2016-04-20 NOTE — ED Notes (Signed)
2 sets of blood cultures collected prior to antibiotic administration. 

## 2016-04-20 NOTE — Telephone Encounter (Signed)
Patient is no better. He is still flat on his back. Remains afebrile. Multiple stools and leakage if he tries to get OOB. To the ER once he has a driver. PA at [facility] is [employer]. She is aware of the situation.

## 2016-04-20 NOTE — ED Provider Notes (Signed)
[facility] DEPT Provider Note   CSN: [id number] Arrival date & time: 04/20/16  1028     History   Chief Complaint Chief Complaint  Patient presents with  . Rectal Bleeding    HPI Jesse English is a 55 y.o. male.  HPI 55 year old male with complicated past medical history including ulcerative colitis associated with perianal fistula who presents with anal/rectal pain. Patient states that he was recently diagnosed with perianal fistula. This was drained in the office by Dr. Barry Dienes this week. Over the last several days, he has had increasing pain, redness, and throbbing, aching sensation along his rectum. Over the last 24 hours, he has developed copious amounts of bloody discharge as well as subjective weakening of his rectal tone. He states he is having difficulty holding his bowel movements in. He also states that he has had difficulty sitting due to this pain and actually called EMS as he was unable to sit in his car due to this pain. He has had chills but no known fevers. Denies any alleviating factors. He is not on any blood thinners.  Past Medical History:  Diagnosis Date  . Colon polyps    2013  . Diverticulosis   . Gout   . Hypertension   . Sleep apnea    cpap  . Ulcerative colitis [facility])     Patient Active Problem List   Diagnosis Date Noted  . Perirectal abscess 04/20/2016  . Ulcerative colitis without complications ([place]) 83/15/1761    Past Surgical History:  Procedure Laterality Date  . ANAL FISSURE REPAIR    . NO PAST SURGERIES         Home Medications    Prior to Admission medications   Medication Sig Start Date End Date Taking? Authorizing Provider  amLODipine (NORVASC) 5 MG tablet Take 5 mg by mouth daily. 04/13/16  Yes Historical Provider, MD  doxycycline (VIBRAMYCIN) 100 MG capsule Take 100 mg by mouth 2 (two) times daily. 04/14/16  Yes Historical Provider, MD  LIALDA 1.2 g EC tablet Take 2.4 g by mouth 2 (two) times daily.  02/24/16  Yes  Historical Provider, MD  metroNIDAZOLE (FLAGYL) 500 MG tablet Take 1 tablet (500 mg total) by mouth 3 (three) times daily. 04/19/16  Yes Mauri Pole, MD  Probiotic Product (VSL#3) CAPS Take 1 capsule by mouth daily. Begin after 2nd week of Vancomycin 02/04/16  Yes Mauri Pole, MD  tamsulosin (FLOMAX) 0.4 MG CAPS capsule Take 1 capsule (0.4 mg total) by mouth daily. 04/15/16  Yes Dorie Rank, MD  mesalamine (PENTASA) 500 MG CR capsule Take 2 capsules (1,000 mg total) by mouth 4 (four) times daily. 04/10/16   Mauri Pole, MD  Mesalamine-Cleanser (ROWASA) 4 g KIT Place 1 kit (4 g total) rectally at bedtime. 04/10/16   Mauri Pole, MD    Family History Family History  Problem Relation Age of Onset  . Colon cancer Neg Hx   . Esophageal cancer Neg Hx   . Stomach cancer Neg Hx   . Rectal cancer Neg Hx     Social History Social History  Substance Use Topics  . Smoking status: Former Smoker    Types: Cigars    Quit date: 07/10/2013  . Smokeless tobacco: Never Used  . Alcohol use No     Comment: 2 per day     Allergies   Ace inhibitors   Review of Systems Review of Systems  Constitutional: Positive for fatigue. Negative for chills and fever.  HENT: Negative for congestion and rhinorrhea.   Eyes: Negative for visual disturbance.  Respiratory: Negative for cough, shortness of breath and wheezing.   Cardiovascular: Negative for chest pain and leg swelling.  Gastrointestinal: Positive for abdominal pain, anal bleeding, blood in stool and rectal pain. Negative for diarrhea, nausea and vomiting.  Genitourinary: Negative for dysuria and flank pain.  Musculoskeletal: Negative for neck pain and neck stiffness.  Skin: Negative for rash and wound.  Allergic/Immunologic: Negative for immunocompromised state.  Neurological: Negative for syncope, weakness and headaches.  All other systems reviewed and are negative.    Physical Exam Updated Vital Signs BP 120/70 (BP  Location: Left Arm)   Pulse 89   Temp 98.1 F (36.7 C) (Oral)   Resp 18   Ht 5' 11" (1.803 m)   Wt 225 lb (102.1 kg)   SpO2 97%   BMI 31.38 kg/m   Physical Exam  Constitutional: He is oriented to person, place, and time. He appears well-developed and well-nourished. No distress.  HENT:  Head: Normocephalic and atraumatic.  Mouth/Throat: No oropharyngeal exudate.  Eyes: Conjunctivae are normal.  Neck: Neck supple.  Cardiovascular: Normal rate, regular rhythm and normal heart sounds.  Exam reveals no friction rub.   No murmur heard. Pulmonary/Chest: Effort normal and breath sounds normal. No respiratory distress. He has no wheezes. He has no rales.  Abdominal: Soft. Bowel sounds are normal. He exhibits no distension. There is no tenderness. There is no rebound and no guarding.  Genitourinary:  Genitourinary Comments: Draining fistula lateral to the anal verge along the left medial gluteus. Fistula appears to be draining copious amounts of mixed purulent, bloody, and feculent matter. Moderate surrounding erythema and tenderness. Drainage is spontaneous.  Musculoskeletal: He exhibits no edema.  Neurological: He is alert and oriented to person, place, and time. He exhibits normal muscle tone.  Skin: Skin is warm. Capillary refill takes less than 2 seconds.  Psychiatric: He has a normal mood and affect.  Nursing note and vitals reviewed.    ED Treatments / Results  Labs (all labs ordered are listed, but only abnormal results are displayed) Labs Reviewed  COMPREHENSIVE METABOLIC PANEL - Abnormal; Notable for the following:       Result Value   Sodium 133 (*)    Glucose, Bld 106 (*)    BUN 22 (*)    Calcium 8.4 (*)    Albumin 2.8 (*)    All other components within normal limits  CBC - Abnormal; Notable for the following:    WBC 13.8 (*)    All other components within normal limits  PROTIME-INR - Abnormal; Notable for the following:    Prothrombin Time 15.9 (*)    All other  components within normal limits  I-STAT CHEM 8, ED - Abnormal; Notable for the following:    Glucose, Bld 104 (*)    Calcium, Ion 1.08 (*)    All other components within normal limits  C DIFFICILE QUICK SCREEN W PCR REFLEX  CULTURE, BLOOD (ROUTINE X 2)  CULTURE, BLOOD (ROUTINE X 2)  LIPASE, BLOOD  CBC  I-STAT CG4 LACTIC ACID, ED  I-STAT CG4 LACTIC ACID, ED  TYPE AND SCREEN  ABO/RH    EKG  EKG Interpretation None       Radiology Ct Abdomen Pelvis W Contrast  Result Date: 04/20/2016 CLINICAL DATA:  Recently diagnosed irritable bowel disease. Rectal bleeding for 3 days. History of rectal abscess and fistula. EXAM: CT ABDOMEN AND PELVIS WITH CONTRAST TECHNIQUE:  Multidetector CT imaging of the abdomen and pelvis was performed using the standard protocol following bolus administration of intravenous contrast. CONTRAST:  166m ISOVUE-300 IOPAMIDOL (ISOVUE-300) INJECTION 61% COMPARISON:  08/16/2015 FINDINGS: Lower chest: No acute infiltrate or effusion. 1 cm calcified left posterior lower lobe nodule, presumably a granuloma. Minimal surrounding soft tissue and questionable spiculation is similar compared to prior. Hepatobiliary: Mild diffuse decreased attenuation of the liver, consistent with fatty changes. No focal abnormality. Gallbladder contains no calcified stones. No intra or extrahepatic biliary dilatation. Pancreas: Unremarkable. No pancreatic ductal dilatation or surrounding inflammatory changes. Spleen: Normal in size without focal abnormality. Adrenals/Urinary Tract: Bilateral adrenal glands are within normal limits. Sub cm cortical hypodense lesions in the kidneys too small to further characterize but are unchanged. 1 cm cyst in the midpole of the right kidney, unchanged. No hydronephrosis. Bladder contains a Foley catheter. Mild bilateral nonspecific perinephric fat stranding and edema. Stomach/Bowel: Stomach is nondilated. No dilated small bowel to suggest obstruction. Appendix is  visualized and is normal. Multiple irregular perirectal and perianal mildly rim enhancing fluid collections, right greater than left. Collection measures approximately 5.7 cm transverse by 5.7 cm in AP by 7 cm cranial caudad. Moderate surrounding edema and inflammation in the gluteal fat. Extension of fluid and edema to the skin surface, consistent with fistula. Mild edema and soft tissue stranding in the presacral space. Vascular/Lymphatic: Tiny perirectal lymph nodes. Stable left para-aortic 8 mm lymph node at the level of the SMA. Atherosclerotic vascular disease of the aorta. No aneurysm. Reproductive: Prostate gland unremarkable. Other: No free air.  Small fatty containing umbilical hernia. Musculoskeletal: No acute osseous abnormality. Mild retrolisthesis of L1 on L2, L2 on L3, L3 on L4 and L4 on L5. Associated disc space narrowing and vacuum discs. Canal stenosis of the lower lumbar spine. IMPRESSION: 1. Moderate edema and soft tissue stranding in the perirectal space with irregular, mildly rim enhancing perirectal and perianal fluid collection, consistent with abscess. Extension of fluid and soft tissue changes to the skin surface suspicious for fistula. 2. Fatty infiltration of the liver. 3. Mild bilateral nonspecific perinephric fat stranding. 4. Small intrapelvic lymph nodes, likely reactive. 5. Calcified left lower lobe nodule with minimal surrounding soft tissue, favor granuloma. Electronically Signed   By: KDonavan FoilM.D.   On: 04/20/2016 14:54    Procedures Procedures (including critical care time)  Medications Ordered in ED Medications  HYDROmorphone (DILAUDID) injection 1 mg (1 mg Intravenous Not Given 04/20/16 1218)  vancomycin (VANCOCIN) 2,000 mg in sodium chloride 0.9 % 500 mL IVPB (2,000 mg Intravenous New Bag/Given 04/20/16 1636)  vancomycin (VANCOCIN) IVPB 750 mg/150 ml premix (not administered)  piperacillin-tazobactam (ZOSYN) IVPB 3.375 g (not administered)  Influenza vac  split quadrivalent PF (FLUARIX) injection 0.5 mL (not administered)  enoxaparin (LOVENOX) injection 40 mg (not administered)  0.45 % sodium chloride infusion ( Intravenous New Bag/Given 04/20/16 1636)  acetaminophen (TYLENOL) tablet 650 mg (not administered)    Or  acetaminophen (TYLENOL) suppository 650 mg (not administered)  oxyCODONE (Oxy IR/ROXICODONE) immediate release tablet 5-10 mg (not administered)  HYDROmorphone (DILAUDID) injection 0.5-1 mg (not administered)  methocarbamol (ROBAXIN) tablet 500 mg (not administered)  diphenhydrAMINE (BENADRYL) capsule 25 mg (not administered)    Or  diphenhydrAMINE (BENADRYL) injection 25 mg (not administered)  polyethylene glycol (MIRALAX / GLYCOLAX) packet 17 g (not administered)  bisacodyl (DULCOLAX) EC tablet 5 mg (not administered)  ondansetron (ZOFRAN-ODT) disintegrating tablet 4 mg (not administered)    Or  ondansetron (ZOFRAN) injection 4  mg (not administered)  iopamidol (ISOVUE-300) 61 % injection 30 mL (30 mLs Oral Contrast Given 04/20/16 1159)  iopamidol (ISOVUE-300) 61 % injection 100 mL (100 mLs Intravenous Contrast Given 04/20/16 1410)  piperacillin-tazobactam (ZOSYN) IVPB 3.375 g (0 g Intravenous Stopped 04/20/16 1807)     Initial Impression / Assessment and Plan / ED Course  I have reviewed the triage vital signs and the nursing notes.  Pertinent labs & imaging results that were available during my care of the patient were reviewed by me and considered in my medical decision making (see chart for details).  Clinical Course   55 year old male with past medical history of ulcerative colitis who presents with perianal fistula. On exam, patient has large, draining abscess versus fistula with copious amounts of purulence. Labwork shows moderate leukocytosis but is otherwise unremarkable. CT scan obtained and shows large perianal and perirectal abscess with surrounding stranding. Will start vancomycin, Zosyn, and admit to surgery.  Patient will be taken this evening. Nothing by mouth. IV fluids given. He is otherwise hemodynamically stable. I have also discussed with GI who will evaluate as an inpatient.  Final Clinical Impressions(s) / ED Diagnoses   Final diagnoses:  Perirectal abscess  Leukocytosis, unspecified type    New Prescriptions Current Discharge Medication List       Duffy Bruce, MD 04/20/16 1827

## 2016-04-20 NOTE — Progress Notes (Signed)
Pharmacy Antibiotic Note  Jesse English is a 55 y.o. male admitted on 04/20/2016 with perirectal abscess/fistula.  Pharmacy has been consulted for Vanc/Zosyn dosing.  Plan: Vancomycin 2g x1 then 750mg  IV every 12 hours.  Goal trough 15-20 mcg/mL. Zosyn 3.375g IV q8h (4 hour infusion).  Height: 5\' 11"  (180.3 cm) Weight: 225 lb (102.1 kg) IBW/kg (Calculated) : 75.3  Temp (24hrs), Avg:98.1 F (36.7 C), Min:98.1 F (36.7 C), Max:98.1 F (36.7 C)   Recent Labs Lab 04/15/16 0815 04/20/16 1054 04/20/16 1113 04/20/16 1114 04/20/16 1404  WBC 16.8* 13.8*  --   --   --   CREATININE 1.51* 1.15 1.20  --   --   LATICACIDVEN  --   --   --  1.18 1.30    Estimated Creatinine Clearance: 82.6 mL/min (by C-G formula based on SCr of 1.2 mg/dL).    Allergies  Allergen Reactions  . Ace Inhibitors Swelling    Facial swelling and small intestinal swelling.    Thank you for allowing pharmacy to be a part of this patient's care.   Adrian Saran, PharmD, BCPS Pager [phone] 04/20/2016 3:28 PM

## 2016-04-20 NOTE — H&P (Signed)
[facility] Admission Note  English English Feb 13, 1961  [id number].    Requesting MD: Duffy Bruce Chief Complaint/Reason for Consult: Perirectal abscess  HPI:  English English is a 55yo male with recent diagnosis of IBD (originally thought UC now thinking it's Crohns) who presented to [facility] earlier today with 3 days of rectal bleeding. States that about 2 weeks ago he started having a burning sensation in his rectum that gradually got worse. He was diagnosed with a perirectal abscess and was seen OP at [facility] by Dr. Barry Dienes 04/13/16. The abscess was drained but patient states that Dr. Barry Dienes was displeased by the amount of drainage she got from the abscess. He was started on doxycycline but states that the abscess got worse. It began to spontaneously drain 3 days ago; states that he has been unable to control the bleeding and has to change depends multiple times daily. Admits to low grade fevers, decreased appetite, and softer than normal stools. Denies abdominal pain, nausea, or vomiting. States that he has never had a perirectal abscess before. States that today is actually the best day as far as pain goes since it appeared 2 weeks ago. Last meal 8am yesterday, last drink of water 9am today Last BM this morning  ED workup: - CT shows moderate edema and soft tissue stranding in the perirectal space with irregular, mildly rim enhancing perirectal and perianal fluid collection, consistent with abscess. Extension of fluid and soft tissue changes to the skin surface suspicious for fistula. - WBC 13.8, lactic acid 1.3, afebrile  PMH significant for UC vs Crohns, HTN, gout No prior history of abdominal surgery Denies home use of anticoagulants Nonsmoker Employment: owns [employer]  ROS: All systems reviewed and otherwise negative except for as above  Family History  Problem Relation Age of Onset  . Colon cancer Neg Hx   . Esophageal cancer Neg Hx   . Stomach cancer  Neg Hx   . Rectal cancer Neg Hx     Past Medical History:  Diagnosis Date  . Colon polyps    2013  . Diverticulosis   . Gout   . Hypertension   . Sleep apnea    cpap  . Ulcerative colitis [facility])     Past Surgical History:  Procedure Laterality Date  . ANAL FISSURE REPAIR    . NO PAST SURGERIES      Social History:  reports that he quit smoking about 2 years ago. His smoking use included Cigars. He has never used smokeless tobacco. He reports that he does not drink alcohol or use drugs.  Allergies:  Allergies  Allergen Reactions  . Ace Inhibitors Swelling    Facial swelling and small intestinal swelling.     (Not in a hospital admission)  Blood pressure 119/71, pulse 90, temperature 98.1 F (36.7 C), temperature source Oral, resp. rate 20, height 5' 11"  (1.803 m), weight 225 lb (102.1 kg), SpO2 94 %. Physical Exam: General: pleasant, WD/WN white male who is laying in bed in NAD HEENT: head is normocephalic, atraumatic.  Mouth is pink and moist Heart: regular, rate, and rhythm.  No obvious murmurs, gallops, or rubs noted.  Palpable pedal pulses bilaterally Lungs: CTAB, no wheezes, rhonchi, or rales noted.  Respiratory effort nonlabored Abd: soft, NT/ND, +BS, no masses, hernias, or organomegaly MS: all 4 extremities are symmetrical with no cyanosis, clubbing, or edema. Skin: warm and dry with no masses, lesions, or rashes Psych: A&Ox3 with an appropriate affect. Rectum: copious  amount of spontaneously draining purulent/bloody drainage from perirectal abscess, no definite fecal matter in drainage but I am not sure; external hemorrhoids   Results for orders placed or performed during the hospital encounter of 04/20/16 (from the past 48 hour(s))  Comprehensive metabolic panel     Status: Abnormal   Collection Time: 04/20/16 10:54 AM  Result Value Ref Range   Sodium 133 (L) 135 - 145 mmol/L   Potassium 3.7 3.5 - 5.1 mmol/L   Chloride 102 101 - 111 mmol/L   CO2 22 22 - 32  mmol/L   Glucose, Bld 106 (H) 65 - 99 mg/dL   BUN 22 (H) 6 - 20 mg/dL   Creatinine, Ser 1.15 0.61 - 1.24 mg/dL   Calcium 8.4 (L) 8.9 - 10.3 mg/dL   Total Protein 7.5 6.5 - 8.1 g/dL   Albumin 2.8 (L) 3.5 - 5.0 g/dL   AST 30 15 - 41 U/L   ALT 45 17 - 63 U/L   Alkaline Phosphatase 44 38 - 126 U/L   Total Bilirubin 1.1 0.3 - 1.2 mg/dL   GFR calc non Af Amer >60 >60 mL/min   GFR calc Af Amer >60 >60 mL/min    Comment: (NOTE) The eGFR has been calculated using the CKD EPI equation. This calculation has not been validated in all clinical situations. eGFR's persistently <60 mL/min signify possible Chronic Kidney Disease.    Anion gap 9 5 - 15  CBC     Status: Abnormal   Collection Time: 04/20/16 10:54 AM  Result Value Ref Range   WBC 13.8 (H) 4.0 - 10.5 K/uL   RBC 4.44 4.22 - 5.81 MIL/uL   Hemoglobin 14.2 13.0 - 17.0 g/dL   HCT 41.6 39.0 - 52.0 %   MCV 93.7 78.0 - 100.0 fL   MCH 32.0 26.0 - 34.0 pg   MCHC 34.1 30.0 - 36.0 g/dL   RDW 12.8 11.5 - 15.5 %   Platelets 341 150 - 400 K/uL  Lipase, blood     Status: None   Collection Time: 04/20/16 10:54 AM  Result Value Ref Range   Lipase 20 11 - 51 U/L  Protime-INR     Status: Abnormal   Collection Time: 04/20/16 10:54 AM  Result Value Ref Range   Prothrombin Time 15.9 (H) 11.4 - 15.2 seconds   INR 1.26   Type and screen [place]     Status: None   Collection Time: 04/20/16 10:55 AM  Result Value Ref Range   ABO/RH(D) O POS    Antibody Screen NEG    Sample Expiration 04/23/2016   ABO/Rh     Status: None   Collection Time: 04/20/16 10:55 AM  Result Value Ref Range   ABO/RH(D) O POS   I-Stat Chem 8, ED     Status: Abnormal   Collection Time: 04/20/16 11:13 AM  Result Value Ref Range   Sodium 136 135 - 145 mmol/L   Potassium 3.7 3.5 - 5.1 mmol/L   Chloride 101 101 - 111 mmol/L   BUN 20 6 - 20 mg/dL   Creatinine, Ser 1.20 0.61 - 1.24 mg/dL   Glucose, Bld 104 (H) 65 - 99 mg/dL   Calcium, Ion 1.08 (L)  1.15 - 1.40 mmol/L   TCO2 23 0 - 100 mmol/L   Hemoglobin 15.3 13.0 - 17.0 g/dL   HCT 45.0 39.0 - 52.0 %  I-Stat CG4 Lactic Acid, ED     Status: None   Collection Time:  04/20/16 11:14 AM  Result Value Ref Range   Lactic Acid, Venous 1.18 0.5 - 1.9 mmol/L  C difficile quick scan w PCR reflex     Status: None   Collection Time: 04/20/16  1:16 PM  Result Value Ref Range   C Diff antigen NEGATIVE NEGATIVE   C Diff toxin NEGATIVE NEGATIVE   C Diff interpretation No C. difficile detected.   I-Stat CG4 Lactic Acid, ED     Status: None   Collection Time: 04/20/16  2:04 PM  Result Value Ref Range   Lactic Acid, Venous 1.30 0.5 - 1.9 mmol/L   Ct Abdomen Pelvis W Contrast  Result Date: 04/20/2016 CLINICAL DATA:  Recently diagnosed irritable bowel disease. Rectal bleeding for 3 days. History of rectal abscess and fistula. EXAM: CT ABDOMEN AND PELVIS WITH CONTRAST TECHNIQUE: Multidetector CT imaging of the abdomen and pelvis was performed using the standard protocol following bolus administration of intravenous contrast. CONTRAST:  178m ISOVUE-300 IOPAMIDOL (ISOVUE-300) INJECTION 61% COMPARISON:  08/16/2015 FINDINGS: Lower chest: No acute infiltrate or effusion. 1 cm calcified left posterior lower lobe nodule, presumably a granuloma. Minimal surrounding soft tissue and questionable spiculation is similar compared to prior. Hepatobiliary: Mild diffuse decreased attenuation of the liver, consistent with fatty changes. No focal abnormality. Gallbladder contains no calcified stones. No intra or extrahepatic biliary dilatation. Pancreas: Unremarkable. No pancreatic ductal dilatation or surrounding inflammatory changes. Spleen: Normal in size without focal abnormality. Adrenals/Urinary Tract: Bilateral adrenal glands are within normal limits. Sub cm cortical hypodense lesions in the kidneys too small to further characterize but are unchanged. 1 cm cyst in the midpole of the right kidney, unchanged. No  hydronephrosis. Bladder contains a Foley catheter. Mild bilateral nonspecific perinephric fat stranding and edema. Stomach/Bowel: Stomach is nondilated. No dilated small bowel to suggest obstruction. Appendix is visualized and is normal. Multiple irregular perirectal and perianal mildly rim enhancing fluid collections, right greater than left. Collection measures approximately 5.7 cm transverse by 5.7 cm in AP by 7 cm cranial caudad. Moderate surrounding edema and inflammation in the gluteal fat. Extension of fluid and edema to the skin surface, consistent with fistula. Mild edema and soft tissue stranding in the presacral space. Vascular/Lymphatic: Tiny perirectal lymph nodes. Stable left para-aortic 8 mm lymph node at the level of the SMA. Atherosclerotic vascular disease of the aorta. No aneurysm. Reproductive: Prostate gland unremarkable. Other: No free air.  Small fatty containing umbilical hernia. Musculoskeletal: No acute osseous abnormality. Mild retrolisthesis of L1 on L2, L2 on L3, L3 on L4 and L4 on L5. Associated disc space narrowing and vacuum discs. Canal stenosis of the lower lumbar spine. IMPRESSION: 1. Moderate edema and soft tissue stranding in the perirectal space with irregular, mildly rim enhancing perirectal and perianal fluid collection, consistent with abscess. Extension of fluid and soft tissue changes to the skin surface suspicious for fistula. 2. Fatty infiltration of the liver. 3. Mild bilateral nonspecific perinephric fat stranding. 4. Small intrapelvic lymph nodes, likely reactive. 5. Calcified left lower lobe nodule with minimal surrounding soft tissue, favor granuloma. Electronically Signed   By: KDonavan FoilM.D.   On: 04/20/2016 14:54      Assessment/Plan Perirectal abscess with possible fistula - recent diagnosis of UC vs Crohns, followed by Dr. NSilverio Decamp- perirectal abscess appeared 2 weeks ago, had I&D in office by Dr. BBarry Dienes10/5/17, now with increased drainage - CT  shows moderate edema and soft tissue stranding in the perirectal space with irregular, mildly rim enhancing perirectal and  perianal fluid collection, consistent with abscess. Extension of fluid and soft tissue changes to the skin surface suspicious for fistula. - WBC 13.8, lactic acid 1.3, afebrile  UC vs Crohns HTN Gout  ID - zosyn VTE - SCDs FEN - NPO, IVF  Plan - admit to med-surg, NPO, IV antibiotics, and likely plan for I&D in the OR; will discuss with MD. GI has already been consulted and seen the patient, appreciate recommendations.   Jerrye Beavers, [facility] 04/20/2016, 3:33 PM Pager: [phone] Consults: [phone] Mon-Fri 7:00 am-4:30 pm Sat-Sun 7:00 am-11:30 am

## 2016-04-20 NOTE — Progress Notes (Signed)
Pt has home CPAP and wishes to utilize tonight.  RT inspected machine for damages and none were found.  Pt's machine is in proper working order at this time.  Pt prefers self placement and will contact RT if any assistance is required throughout the night.  RT to monitor and assess as needed.

## 2016-04-20 NOTE — Consult Note (Addendum)
Consultation  Referring Provider:  Dr. Ellender Hose      Primary Care Physician:  Marton Redwood, MD Primary Gastroenterologist:    Dr. Silverio Decamp     Reason for Consultation: Rectal abscess, h/o Ulcerative colitis        [facility] Attending   I have taken an interval history, reviewed the chart and examined the patient. I agree with the Advanced Practitioner's note, impression and recommendations.   Complicated situation with recent dx IBD at colonoscopy - and also some C diff infections earlier in the year. Now with perirectal abscess s/p I and D and having bleeding and drainage - to go to OR tomorrow for EUA.  Will hold off on colonoscopy. No hemorrhoid banding in future.  Will treat gout flare with a dose of colchicine.  Gatha Mayer, MD, [facility] [phone] (pager) [phone] after 5 PM, weekends and holidays  04/20/2016 6:21 PM     Impression / Plan:   Impression: 1. Inflammatory bowel disease: Originally diagnosed in March of this year via biopsy, complicated currently by perianal fistula/abscess, question if this is truly Crohn's versus ulcerative colitis as previously thought, patient has been maintained on Lialda 2.4 g daily until 3 days ago when he increase this to 4.8 g per day, order for Pentasa has been given, patient has not picked this up, per Dr. Silverio Decamp she would like further investigation at this time to see what the patient's disease state is and whether or not he truly has Crohn's or colitis. 2. Rectal abscess/ Fistula: First appeared 2 weeks ago, diagnosed by PCP, patient had I&D on 04/13/16 with little relief, currently not in as much pain from this as previously, no imaging as of yet, per ER physician at time of exam fistula was "pouring purulent fluid and blood"  Plan: 1. Discussed this case with Dr. Silverio Decamp, who would appreciate if patient is scheduled for a colonoscopy for further investigation of his Crohn's versus colitis to  better his treatment plan in the future 2. We will also need to discuss the possibility of pelvic MRI to better evaluate abscess 3. Patient to remain on clear liquid diets today in preparation for colonoscopy tomorrow 4. Discussed colonoscopy with the patient. Discussed risks, benefits, limitations and alternatives and the patient agrees to proceed. This will be scheduled with Dr. Carlean Purl tomorrow 5. Continue metronidazole started yesterday via phone call with the patient, pending results of C. difficile. 6. Prep ordered. 7. Please await any further recommendations from Dr. Carlean Purl. Dr. Silverio Decamp also intends on seeing the patient today after admission.  Thank you for your kind consultation, we will continue to follow.  Lavone Nian Lemmon  04/20/2016, 11:28 AM Pager #: [phone]            HPI:   Jesse English is a 55 y.o. Caucasian male with history of recently diagnosed irritable bowel disease in March of this year, who regularly follows with Dr. Silverio Decamp, who presents to the ED today with a complaint of rectal bleeding for 3 days and recently diagnosed rectal abscess/fistula.  Today, the patient explains that 2 weeks ago he had a rectal abscess diagnosed by his primary care provider, he was seen by surgery for this last Thursday, 04/13/16 and it was I&D'd. The patient describes that this continued to be painful, but 2 days ago he felt like it "popped open", and today is the best day as far as pain that he has had since it appeared 2 weeks  ago. The patient has had drainage from this area including bright red blood and pus. Currently he is having so much bleeding from this that he cannot control it and is wearing depends. He tells me that he still has some pain and does not seem to be able to sit on his bottom or walk as this makes it worse.  The patient vaguely describes that he has had 2 episodes of C. difficile since March of this year and has been trying out different medicine for  his "ulcerative colitis". Currently the patient increased his Lialda from 2.4 g up to 4.8 g 3 days ago by himself. He also admits to being on antibiotics recently after his I&D last week which did not really seem to help. He started on this past Saturday and has a 10 day course to finish. He is unsure what this was exactly. Over this time, the patient has had loose stools consistently.  Patient denies fever, chills, abdominal pain, heartburn, reflux, use of NSAIDs or change in medications.  Past GI history: 04/19/16-phone call, Dr. Silverio Decamp: Stat C. difficile was recommended as well as a flexible sigmoidoscopy immediately, per nursing staff patient was called in regards to these recommendations and expressed that he could hardly get out of bed due to his rectal abscess and pain from this, it was recommended that he come to the ED 04/08/16-phone call, Dr. Silverio Decamp: Pentasa 1 g 4 times daily and Rowasa enemas at bedtime 4 weeks was sent in for the patient-he apparently never picked these up 03/16/16-office visit, Dr. Silverio Decamp: Patient was following for his left-sided ulcerative colitis which was diagnosed in March 2017 in the setting of C. difficile colitis, patient had subsequent flare in the setting of recurrent C. difficile colitis in April 2017 and July 2017 treated with prolonged vancomycin taper, he was following up at that time. He recently finished a brief steroid taper and also Rowasa enemas he was feeling better with decreased stool frequency but continued to have intermittent bright red blood per rectum. Patient was taking Lialda 2.4 g daily. There was questionable possible rash related to higher dose of Lialda 4.8 g. At tjhat time patient advised to take VSL #3 one capsule twice daily for the next 3 months and continue Lialda 2.4 g daily and given samples of Pentasa and Apriso and Asacol to try separately to see if he tolerated them better It was thought bleeding was intermittent and related to  internal hemorrhoids, patient was scheduled for hemorrhoidal band ligation in 6-8 weeks. 09/23/15-colonoscopy, Dr. Silverio Decamp: Impression-nonbleeding internal hemorrhoids and congested mucosa in the rectosigmoid colon; pathology-chronic active colitis in the rectum and chronic quiescent colitis throughout the colon  Past Medical History:  Diagnosis Date  . Colon polyps    2013  . Diverticulosis   . Gout   . Hypertension   . Sleep apnea    cpap  . Ulcerative colitis [facility])     Past Surgical History:  Procedure Laterality Date  . ANAL FISSURE REPAIR    . NO PAST SURGERIES      Family History  Problem Relation Age of Onset  . Colon cancer Neg Hx   . Esophageal cancer Neg Hx   . Stomach cancer Neg Hx   . Rectal cancer Neg Hx     Social History  Substance Use Topics  . Smoking status: Former Smoker    Types: Cigars    Quit date: 07/10/2013  . Smokeless tobacco: Never Used  . Alcohol use No  Comment: 2 per day    Prior to Admission medications   Medication Sig Start Date End Date Taking? Authorizing Provider  amLODipine (NORVASC) 5 MG tablet Take 5 mg by mouth daily. 04/13/16  Yes Historical Provider, MD  doxycycline (VIBRAMYCIN) 100 MG capsule Take 100 mg by mouth 2 (two) times daily. 04/14/16  Yes Historical Provider, MD  LIALDA 1.2 g EC tablet Take 2.4 g by mouth 2 (two) times daily.  02/24/16  Yes Historical Provider, MD  metroNIDAZOLE (FLAGYL) 500 MG tablet Take 1 tablet (500 mg total) by mouth 3 (three) times daily. 04/19/16  Yes Mauri Pole, MD  Probiotic Product (VSL#3) CAPS Take 1 capsule by mouth daily. Begin after 2nd week of Vancomycin 02/04/16  Yes Mauri Pole, MD  tamsulosin (FLOMAX) 0.4 MG CAPS capsule Take 1 capsule (0.4 mg total) by mouth daily. 04/15/16  Yes Dorie Rank, MD  mesalamine (PENTASA) 500 MG CR capsule Take 2 capsules (1,000 mg total) by mouth 4 (four) times daily. 04/10/16   Mauri Pole, MD  Mesalamine-Cleanser (ROWASA) 4 g KIT Place 1  kit (4 g total) rectally at bedtime. 04/10/16   Mauri Pole, MD    No current facility-administered medications for this encounter.    Current Outpatient Prescriptions  Medication Sig Dispense Refill  . amLODipine (NORVASC) 5 MG tablet Take 5 mg by mouth daily.    Marland Kitchen doxycycline (VIBRAMYCIN) 100 MG capsule Take 100 mg by mouth 2 (two) times daily.    Marland Kitchen LIALDA 1.2 g EC tablet Take 2.4 g by mouth 2 (two) times daily.     . metroNIDAZOLE (FLAGYL) 500 MG tablet Take 1 tablet (500 mg total) by mouth 3 (three) times daily. 30 tablet 0  . Probiotic Product (VSL#3) CAPS Take 1 capsule by mouth daily. Begin after 2nd week of Vancomycin 60 capsule 3  . tamsulosin (FLOMAX) 0.4 MG CAPS capsule Take 1 capsule (0.4 mg total) by mouth daily. 14 capsule 0  . mesalamine (PENTASA) 500 MG CR capsule Take 2 capsules (1,000 mg total) by mouth 4 (four) times daily. 240 capsule 2  . Mesalamine-Cleanser (ROWASA) 4 g KIT Place 1 kit (4 g total) rectally at bedtime. 28 kit 0    Allergies as of 04/20/2016 - Review Complete 04/20/2016  Allergen Reaction Noted  . Ace inhibitors Swelling 09/23/2015     Review of Systems:     Constitutional: No weight loss, fever, chills, weakness or fatigue HEENT: Eyes: No change in vision               Ears, Nose, Throat:  No change in hearing or congestion Skin: No rash or itching Cardiovascular: No chest pain, chest pressure or palpitations   Respiratory: No SOB or cough Gastrointestinal: See HPI and otherwise negative Genitourinary: No dysuria or change in urinary frequency Neurological: No headache, dizziness or syncope Musculoskeletal: No new muscle or joint pain Hematologic: No bleeding or bruising Psychiatric: No history of depression or anxiety    Physical Exam:  Vital signs in last 24 hours: Temp:  [98.1 F (36.7 C)] 98.1 F (36.7 C) (10/12 1039) Pulse Rate:  [87-90] 90 (10/12 1115) Resp:  [18-22] 22 (10/12 1115) BP: (120)/(72) 120/72 (10/12  1039) SpO2:  [96 %-98 %] 96 % (10/12 1115) Weight:  [225 lb (102.1 kg)] 225 lb (102.1 kg) (10/12 1040)   General:   Pleasant Caucasian male appears to be in NAD, Well developed, Well nourished, alert and cooperative Head:  Normocephalic and atraumatic.  Eyes:   PEERL, EOMI. No icterus. Conjunctiva pink. Ears:  Normal auditory acuity. Neck:  Supple Throat: Oral cavity and pharynx without inflammation, swelling or lesion.  Lungs: Respirations even and unlabored. Lungs clear to auscultation bilaterally.   No wheezes, crackles, or rhonchi.  Heart: Normal S1, S2. No MRG. Regular rate and rhythm. No peripheral edema, cyanosis or pallor.  Abdomen:  Soft, nondistended, nontender. No rebound or guarding. Normal bowel sounds. No appreciable masses or hepatomegaly. Rectal:  Not performed. (Patient had a large amount of diarrhea in his depends at time of interview, rectal wall need to be performed likely at time of colonoscopy)-per ER physician fistula draining mixture of purulent material and feces Msk:  Symmetrical without gross deformities. Extremities:  Without edema, + R Podagra Neurologic:  Alert and  oriented x4;  grossly normal neurologically. Skin:   Dry and intact without significant lesions or rashes. Psychiatric: Oriented to person, place and time. Demonstrates good judgement and reason without abnormal affect or behaviors.   LAB RESULTS:  Recent Labs  04/20/16 1113  HGB 15.3  HCT 45.0   BMET  Recent Labs  04/20/16 1113  NA 136  K 3.7  CL 101  GLUCOSE 104*  BUN 20  CREATININE 1.20     PREVIOUS ENDOSCOPIES:            See HPI

## 2016-04-20 NOTE — ED Triage Notes (Signed)
Per EMS-bleeding rectally for 3 days-recently diagnosed with rectal abscess-patient states it is a Crohn's flare up

## 2016-04-20 NOTE — ED Notes (Signed)
Per Surgery, patient has rectal abscess-surgery was done this week-now having incontinence of stool and increased pain-sent to ED for evaluation-page Shelah Lewandowsky, PA when patient arrives [phone]

## 2016-04-21 ENCOUNTER — Telehealth: Payer: Self-pay | Admitting: *Deleted

## 2016-04-21 ENCOUNTER — Observation Stay (HOSPITAL_COMMUNITY): Payer: 59 | Admitting: Anesthesiology

## 2016-04-21 ENCOUNTER — Encounter (HOSPITAL_COMMUNITY): Admission: EM | Disposition: A | Discharge status: Home Health | Payer: Self-pay | Source: Home / Self Care

## 2016-04-21 ENCOUNTER — Encounter (HOSPITAL_COMMUNITY): Payer: Self-pay | Admitting: Registered Nurse

## 2016-04-21 DIAGNOSIS — K648 Other hemorrhoids: Secondary | ICD-10-CM | POA: Diagnosis present

## 2016-04-21 DIAGNOSIS — K6289 Other specified diseases of anus and rectum: Secondary | ICD-10-CM | POA: Diagnosis present

## 2016-04-21 DIAGNOSIS — N493 Fournier gangrene: Secondary | ICD-10-CM | POA: Diagnosis present

## 2016-04-21 DIAGNOSIS — K50114 Crohn's disease of large intestine with abscess: Secondary | ICD-10-CM

## 2016-04-21 DIAGNOSIS — Z86010 Personal history of colonic polyps: Secondary | ICD-10-CM | POA: Diagnosis not present

## 2016-04-21 DIAGNOSIS — I10 Essential (primary) hypertension: Secondary | ICD-10-CM | POA: Diagnosis present

## 2016-04-21 DIAGNOSIS — Z87891 Personal history of nicotine dependence: Secondary | ICD-10-CM | POA: Diagnosis not present

## 2016-04-21 DIAGNOSIS — K611 Rectal abscess: Secondary | ICD-10-CM | POA: Diagnosis present

## 2016-04-21 DIAGNOSIS — R339 Retention of urine, unspecified: Secondary | ICD-10-CM | POA: Diagnosis not present

## 2016-04-21 DIAGNOSIS — Z888 Allergy status to other drugs, medicaments and biological substances status: Secondary | ICD-10-CM | POA: Diagnosis not present

## 2016-04-21 DIAGNOSIS — K5190 Ulcerative colitis, unspecified, without complications: Secondary | ICD-10-CM | POA: Diagnosis present

## 2016-04-21 DIAGNOSIS — G4730 Sleep apnea, unspecified: Secondary | ICD-10-CM | POA: Diagnosis present

## 2016-04-21 DIAGNOSIS — M109 Gout, unspecified: Secondary | ICD-10-CM | POA: Diagnosis present

## 2016-04-21 HISTORY — PX: INCISION AND DRAINAGE ABSCESS: SHX5864

## 2016-04-21 HISTORY — PX: HEMORRHOID SURGERY: SHX153

## 2016-04-21 LAB — CBC
HCT: 38.8 % — ABNORMAL LOW (ref 39.0–52.0)
Hemoglobin: 13 g/dL (ref 13.0–17.0)
MCH: 31.6 pg (ref 26.0–34.0)
MCHC: 33.5 g/dL (ref 30.0–36.0)
MCV: 94.4 fL (ref 78.0–100.0)
PLATELETS: 355 10*3/uL (ref 150–400)
RBC: 4.11 MIL/uL — AB (ref 4.22–5.81)
RDW: 13.1 % (ref 11.5–15.5)
WBC: 12 10*3/uL — AB (ref 4.0–10.5)

## 2016-04-21 SURGERY — COLONOSCOPY
Anesthesia: Moderate Sedation

## 2016-04-21 SURGERY — INCISION AND DRAINAGE, ABSCESS
Anesthesia: General | Site: Rectum

## 2016-04-21 MED ORDER — MIDAZOLAM HCL 5 MG/5ML IJ SOLN
INTRAMUSCULAR | Status: DC | PRN
  Administered 2016-04-21: 2 mg via INTRAVENOUS

## 2016-04-21 MED ORDER — BUPIVACAINE LIPOSOME 1.3 % IJ SUSP
INTRAMUSCULAR | Status: DC | PRN
  Administered 2016-04-21: 20 mL

## 2016-04-21 MED ORDER — DEXAMETHASONE SODIUM PHOSPHATE 10 MG/ML IJ SOLN
INTRAMUSCULAR | Status: DC | PRN
  Administered 2016-04-21: 10 mg via INTRAVENOUS

## 2016-04-21 MED ORDER — PROCHLORPERAZINE EDISYLATE 5 MG/ML IJ SOLN: 5.0000 mg | INTRAMUSCULAR | Status: DC | PRN

## 2016-04-21 MED ORDER — LACTATED RINGERS IV BOLUS (SEPSIS): 1000.0000 mL | Freq: Three times a day (TID) | INTRAVENOUS | Status: AC | PRN

## 2016-04-21 MED ORDER — MESALAMINE ER 250 MG PO CPCR: 1000.0000 mg | ORAL_CAPSULE | Freq: Four times a day (QID) | ORAL | Status: DC

## 2016-04-21 MED ORDER — ONDANSETRON HCL 4 MG/2ML IJ SOLN
INTRAMUSCULAR | Status: AC
Start: 2016-04-21 — End: 2016-04-21
  Filled 2016-04-21: qty 2

## 2016-04-21 MED ORDER — FENTANYL CITRATE (PF) 100 MCG/2ML IJ SOLN
INTRAMUSCULAR | Status: AC
  Filled 2016-04-21: qty 2

## 2016-04-21 MED ORDER — BUPIVACAINE HCL (PF) 0.5 % IJ SOLN
INTRAMUSCULAR | Status: AC
  Filled 2016-04-21: qty 30

## 2016-04-21 MED ORDER — PROPOFOL 10 MG/ML IV BOLUS
INTRAVENOUS | Status: AC
  Filled 2016-04-21: qty 20

## 2016-04-21 MED ORDER — LACTATED RINGERS IV SOLN
INTRAVENOUS | Status: DC | PRN
  Administered 2016-04-21: 12:00:00 via INTRAVENOUS

## 2016-04-21 MED ORDER — ACETAMINOPHEN 500 MG PO TABS
1000.0000 mg | ORAL_TABLET | Freq: Three times a day (TID) | ORAL | Status: DC
  Administered 2016-04-21 – 2016-04-24 (×8): 1000 mg via ORAL
  Filled 2016-04-21 (×9): qty 2

## 2016-04-21 MED ORDER — HYDROMORPHONE HCL 1 MG/ML IJ SOLN: 0.5000 mg | INTRAMUSCULAR | Status: DC | PRN

## 2016-04-21 MED ORDER — PROPOFOL 10 MG/ML IV BOLUS
INTRAVENOUS | Status: DC | PRN
  Administered 2016-04-21: 200 mg via INTRAVENOUS

## 2016-04-21 MED ORDER — ALUM & MAG HYDROXIDE-SIMETH 200-200-20 MG/5ML PO SUSP: 30.0000 mL | Freq: Four times a day (QID) | ORAL | Status: DC | PRN

## 2016-04-21 MED ORDER — VSL#3 PO CAPS: 1.0000 | ORAL_CAPSULE | Freq: Every day | ORAL | Status: DC

## 2016-04-21 MED ORDER — METOPROLOL TARTRATE 12.5 MG HALF TABLET: 12.5000 mg | ORAL_TABLET | Freq: Two times a day (BID) | ORAL | Status: DC | PRN

## 2016-04-21 MED ORDER — TAMSULOSIN HCL 0.4 MG PO CAPS
0.4000 mg | ORAL_CAPSULE | Freq: Every day | ORAL | Status: DC
  Administered 2016-04-22: 0.4 mg via ORAL
  Filled 2016-04-21: qty 1

## 2016-04-21 MED ORDER — FENTANYL CITRATE (PF) 100 MCG/2ML IJ SOLN
INTRAMUSCULAR | Status: DC | PRN
  Administered 2016-04-21 (×4): 50 ug via INTRAVENOUS

## 2016-04-21 MED ORDER — VITAMIN C 500 MG PO TABS
500.0000 mg | ORAL_TABLET | Freq: Two times a day (BID) | ORAL | Status: DC
  Administered 2016-04-21 – 2016-04-24 (×6): 500 mg via ORAL
  Filled 2016-04-21 (×6): qty 1

## 2016-04-21 MED ORDER — AMLODIPINE BESYLATE 5 MG PO TABS
5.0000 mg | ORAL_TABLET | Freq: Every day | ORAL | Status: DC
  Administered 2016-04-22: 5 mg via ORAL
  Filled 2016-04-21: qty 1

## 2016-04-21 MED ORDER — MIDAZOLAM HCL 2 MG/2ML IJ SOLN
INTRAMUSCULAR | Status: AC
  Filled 2016-04-21: qty 2

## 2016-04-21 MED ORDER — MESALAMINE 1.2 G PO TBEC
2.4000 g | DELAYED_RELEASE_TABLET | Freq: Two times a day (BID) | ORAL | Status: DC
  Administered 2016-04-21 – 2016-04-24 (×6): 2.4 g via ORAL
  Filled 2016-04-21 (×6): qty 2

## 2016-04-21 MED ORDER — 0.9 % SODIUM CHLORIDE (POUR BTL) OPTIME
TOPICAL | Status: DC | PRN
  Administered 2016-04-21: 1000 mL

## 2016-04-21 MED ORDER — MAGIC MOUTHWASH
15.0000 mL | Freq: Four times a day (QID) | ORAL | Status: DC | PRN
  Filled 2016-04-21: qty 15

## 2016-04-21 MED ORDER — LIDOCAINE 2% (20 MG/ML) 5 ML SYRINGE
INTRAMUSCULAR | Status: DC | PRN
  Administered 2016-04-21: 100 mg via INTRAVENOUS

## 2016-04-21 MED ORDER — MENTHOL 3 MG MT LOZG: 1.0000 | LOZENGE | OROMUCOSAL | Status: DC | PRN

## 2016-04-21 MED ORDER — SACCHAROMYCES BOULARDII 250 MG PO CAPS
250.0000 mg | ORAL_CAPSULE | Freq: Two times a day (BID) | ORAL | Status: DC
  Administered 2016-04-21: 250 mg via ORAL
  Filled 2016-04-21: qty 1

## 2016-04-21 MED ORDER — HYDROMORPHONE HCL 1 MG/ML IJ SOLN
INTRAMUSCULAR | Status: AC
Start: 2016-04-21 — End: 2016-04-22
  Filled 2016-04-21: qty 1

## 2016-04-21 MED ORDER — DEXAMETHASONE SODIUM PHOSPHATE 10 MG/ML IJ SOLN
INTRAMUSCULAR | Status: AC
Start: 2016-04-21 — End: 2016-04-21
  Filled 2016-04-21: qty 1

## 2016-04-21 MED ORDER — BUPIVACAINE HCL (PF) 0.5 % IJ SOLN
INTRAMUSCULAR | Status: DC | PRN
  Administered 2016-04-21: 30 mL

## 2016-04-21 MED ORDER — PHENOL 1.4 % MT LIQD: 2.0000 | OROMUCOSAL | Status: DC | PRN

## 2016-04-21 MED ORDER — LIP MEDEX EX OINT
1.0000 "application " | TOPICAL_OINTMENT | Freq: Two times a day (BID) | CUTANEOUS | Status: DC
  Administered 2016-04-21 – 2016-04-23 (×4): 1 via TOPICAL
  Filled 2016-04-21: qty 7

## 2016-04-21 MED ORDER — SACCHAROMYCES BOULARDII 250 MG PO CAPS
250.0000 mg | ORAL_CAPSULE | Freq: Two times a day (BID) | ORAL | Status: DC
  Administered 2016-04-22 – 2016-04-24 (×5): 250 mg via ORAL
  Filled 2016-04-21 (×6): qty 1

## 2016-04-21 MED ORDER — HYDROMORPHONE HCL 1 MG/ML IJ SOLN
0.2500 mg | INTRAMUSCULAR | Status: DC | PRN
  Administered 2016-04-21: 0.5 mg via INTRAVENOUS

## 2016-04-21 MED ORDER — LIDOCAINE 2% (20 MG/ML) 5 ML SYRINGE
INTRAMUSCULAR | Status: AC
  Filled 2016-04-21: qty 5

## 2016-04-21 SURGICAL SUPPLY — 37 items
BLADE SURG 15 STRL LF DISP TIS (BLADE) ×2 IMPLANT
BLADE SURG 15 STRL SS (BLADE) ×4
BNDG CONFORM 4 STRL LF (GAUZE/BANDAGES/DRESSINGS) ×2 IMPLANT
BNDG GAUZE ELAST 4 BULKY (GAUZE/BANDAGES/DRESSINGS) ×2 IMPLANT
BRIEF STRETCH FOR OB PAD LRG (UNDERPADS AND DIAPERS) ×4 IMPLANT
COVER SURGICAL LIGHT HANDLE (MISCELLANEOUS) ×4 IMPLANT
DRAIN PENROSE 18X1/2 LTX STRL (DRAIN) ×2 IMPLANT
DRAPE LAPAROTOMY T 102X78X121 (DRAPES) ×4 IMPLANT
DRSG PAD ABDOMINAL 8X10 ST (GAUZE/BANDAGES/DRESSINGS) ×4 IMPLANT
ELECT PENCIL ROCKER SW 15FT (MISCELLANEOUS) ×4 IMPLANT
ELECT REM PT RETURN 9FT ADLT (ELECTROSURGICAL) ×4
ELECTRODE REM PT RTRN 9FT ADLT (ELECTROSURGICAL) ×2 IMPLANT
GAUZE SPONGE 4X4 12PLY STRL (GAUZE/BANDAGES/DRESSINGS) ×4 IMPLANT
GAUZE SPONGE 4X4 16PLY XRAY LF (GAUZE/BANDAGES/DRESSINGS) ×4 IMPLANT
GLOVE ECLIPSE 8.0 STRL XLNG CF (GLOVE) ×4 IMPLANT
GLOVE INDICATOR 8.0 STRL GRN (GLOVE) ×4 IMPLANT
GOWN STRL REUS W/TWL XL LVL3 (GOWN DISPOSABLE) ×10 IMPLANT
KIT BASIN OR (CUSTOM PROCEDURE TRAY) ×4 IMPLANT
LUBRICANT JELLY K Y 4OZ (MISCELLANEOUS) ×4 IMPLANT
NDL SAFETY ECLIPSE 18X1.5 (NEEDLE) IMPLANT
NEEDLE HYPO 18GX1.5 SHARP (NEEDLE) ×4
NEEDLE HYPO 22GX1.5 SAFETY (NEEDLE) ×4 IMPLANT
PACK LITHOTOMY IV (CUSTOM PROCEDURE TRAY) ×2 IMPLANT
SPONGE LAP 18X18 X RAY DECT (DISPOSABLE) ×4 IMPLANT
SUCTION FRAZIER 12FR DISP (SUCTIONS) IMPLANT
SUT CHROMIC 2 0 SH (SUTURE) IMPLANT
SUT CHROMIC 3 0 SH 27 (SUTURE) IMPLANT
SUT SILK 2 0 SH (SUTURE) ×2 IMPLANT
SUT VIC AB 2-0 UR6 27 (SUTURE) ×4 IMPLANT
SWAB COLLECTION DEVICE MRSA (MISCELLANEOUS) IMPLANT
SWAB CULTURE ESWAB REG 1ML (MISCELLANEOUS) IMPLANT
SYR 20CC LL (SYRINGE) ×4 IMPLANT
SYR 5ML LL (SYRINGE) IMPLANT
SYR BULB IRRIGATION 50ML (SYRINGE) ×2 IMPLANT
TOWEL OR 17X26 10 PK STRL BLUE (TOWEL DISPOSABLE) ×4 IMPLANT
TOWEL OR NON WOVEN STRL DISP B (DISPOSABLE) ×4 IMPLANT
YANKAUER SUCT BULB TIP 10FT TU (MISCELLANEOUS) ×4 IMPLANT

## 2016-04-21 NOTE — Progress Notes (Signed)
Patient may go to room -made aware of orders for sleep apnea written by Dr. Johney Maine- per Dr. Oletta Lamas

## 2016-04-21 NOTE — Telephone Encounter (Signed)
Patient is currently admitted with Peri rectal abscess.

## 2016-04-21 NOTE — Telephone Encounter (Signed)
Dr Silverio Decamp. Insurance wants patient to have tried and failed Apriso before they will auth Pentasa. I dont see in patients chart that he has tried this  Should we send this to his pharmacy? How do you want to prescribe it

## 2016-04-21 NOTE — Anesthesia Procedure Notes (Signed)
Procedure Name: LMA Insertion Date/Time: 04/21/2016 1:05 PM Performed by: Talbot Grumbling Pre-anesthesia Checklist: Patient identified, Emergency Drugs available, Suction available and Patient being monitored Patient Re-evaluated:Patient Re-evaluated prior to inductionOxygen Delivery Method: Circle system utilized Preoxygenation: Pre-oxygenation with 100% oxygen Intubation Type: IV induction Ventilation: Mask ventilation without difficulty LMA: LMA inserted LMA Size: 5.0 Number of attempts: 1 Placement Confirmation: positive ETCO2 and breath sounds checked- equal and bilateral Tube secured with: Tape Dental Injury: Teeth and Oropharynx as per pre-operative assessment

## 2016-04-21 NOTE — Progress Notes (Signed)
[address] [place], [place] [address] Phone: [phone] FAX: [phone]   Jesse English [id number] [birth year]  CARE TEAM:  PCP: Marton Redwood, MD  Outpatient Care Team: Patient Care Team: Marton Redwood, MD as PCP - General (Internal Medicine)  Inpatient Treatment Team: Treatment Team: Attending Provider: Nolon Nations, MD; Consulting Physician: Gatha Mayer, MD; Consulting Physician: Nolon Nations, MD; Registered Nurse: Yvette Rack, RN; Technician: Resa Miner, NT; Technician: Lake Bells Hege, NT  Problem List:   Active Problems:   Perirectal abscess   Day of Surgery  04/21/2016     Assessment  Perirectal abscess, probable fistula  IBD  Plan:  -I&D  The anatomy and physiology of the region was discussed. The pathophysiology of subcutaneous abscess formation with progression to fasciitis & sepsis was discussed.  Need for incision, drainage, debridement discussed.  I stressed good hygiene & need for repeated wound care.  Possible redebridement / reoperation was discussed as well. Possibility of recurrence was discussed.   Risks of bleeding, infection, abscess, leak, injury to other organs, need for repair of tissues / organs, need for further treatment, heart attack, death, and other risks were discussed.  Benefits, alternatives were discussed. I noted a good likelihood this will help address the problem.  Questions answered.  The patient agrees to proceed.   -VTE prophylaxis- SCDs, etc -mobilize as tolerated to help recovery  Adin Hector, M.D., F.A.C.S. Gastrointestinal and Minimally Invasive Surgery [facility] [address], [place], [place] [address] [phone] Main / Paging   04/21/2016  Subjective:  Loose BM Pain controlled  Objective:  Vital signs:  Vitals:   04/20/16 1809 04/20/16 2140 04/20/16 2240 04/21/16 0645  BP: 120/70  135/71  121/70  Pulse: 89  82 75  Resp: 18 18 18 18   Temp:   99.3 F (37.4 C) 98 F (36.7 C)  TempSrc:   Oral Oral  SpO2: 97% 97% 96% 97%  Weight:      Height:        Last BM Date: 04/21/16  Intake/Output   Yesterday:  10/12 0701 - 10/13 0700 In: 713.7 [P.O.:240; I.V.:223.7; IV Piggyback:250] Out: 2750 [Urine:2750] This shift:  Total I/O In: 0  Out: 575 [Urine:575]  Bowel function:  Flatus: YES  BM:  YES  Drain: (No drain)   Physical Exam:  General: Pt awake/alert/oriented x4 in No acute distress Eyes: PERRL, normal EOM.  Sclera clear.  No icterus Neuro: CN II-XII intact w/o focal sensory/motor deficits. Lymph: No head/neck/groin lymphadenopathy Psych:  No delerium/psychosis/paranoia HENT: Normocephalic, Mucus membranes moist.  No thrush Neck: Supple, No tracheal deviation Chest: No chest wall pain w good excursion CV:  Pulses intact.  Regular rhythm MS: Normal AROM mjr joints.  No obvious deformity Abdomen: Soft.  Nondistended.  Nontender.  No evidence of peritonitis.  No incarcerated hernias. GU:  NEMG.  No ing hernias  Rectal:  Right perirectal ant cellulitis/asbcess.  Ext. hemorrhoids  Ext:  SCDs BLE.  No mjr edema.  No cyanosis Skin: No petechiae / purpura  Results:   Labs: Results for orders placed or performed during the hospital encounter of 04/20/16 (from the past 48 hour(s))  Comprehensive metabolic panel     Status: Abnormal   Collection Time: 04/20/16 10:54 AM  Result Value Ref Range   Sodium 133 (L) 135 - 145 mmol/L   Potassium 3.7 3.5 - 5.1 mmol/L   Chloride 102 101 -  111 mmol/L   CO2 22 22 - 32 mmol/L   Glucose, Bld 106 (H) 65 - 99 mg/dL   BUN 22 (H) 6 - 20 mg/dL   Creatinine, Ser 1.15 0.61 - 1.24 mg/dL   Calcium 8.4 (L) 8.9 - 10.3 mg/dL   Total Protein 7.5 6.5 - 8.1 g/dL   Albumin 2.8 (L) 3.5 - 5.0 g/dL   AST 30 15 - 41 U/L   ALT 45 17 - 63 U/L   Alkaline Phosphatase 44 38 - 126 U/L   Total Bilirubin 1.1 0.3 - 1.2 mg/dL   GFR calc non Af  Amer >60 >60 mL/min   GFR calc Af Amer >60 >60 mL/min    Comment: (NOTE) The eGFR has been calculated using the CKD EPI equation. This calculation has not been validated in all clinical situations. eGFR's persistently <60 mL/min signify possible Chronic Kidney Disease.    Anion gap 9 5 - 15  CBC     Status: Abnormal   Collection Time: 04/20/16 10:54 AM  Result Value Ref Range   WBC 13.8 (H) 4.0 - 10.5 K/uL   RBC 4.44 4.22 - 5.81 MIL/uL   Hemoglobin 14.2 13.0 - 17.0 g/dL   HCT 41.6 39.0 - 52.0 %   MCV 93.7 78.0 - 100.0 fL   MCH 32.0 26.0 - 34.0 pg   MCHC 34.1 30.0 - 36.0 g/dL   RDW 12.8 11.5 - 15.5 %   Platelets 341 150 - 400 K/uL  Lipase, blood     Status: None   Collection Time: 04/20/16 10:54 AM  Result Value Ref Range   Lipase 20 11 - 51 U/L  Protime-INR     Status: Abnormal   Collection Time: 04/20/16 10:54 AM  Result Value Ref Range   Prothrombin Time 15.9 (H) 11.4 - 15.2 seconds   INR 1.26   Type and screen [place]     Status: None   Collection Time: 04/20/16 10:55 AM  Result Value Ref Range   ABO/RH(D) O POS    Antibody Screen NEG    Sample Expiration 04/23/2016   ABO/Rh     Status: None   Collection Time: 04/20/16 10:55 AM  Result Value Ref Range   ABO/RH(D) O POS   I-Stat Chem 8, ED     Status: Abnormal   Collection Time: 04/20/16 11:13 AM  Result Value Ref Range   Sodium 136 135 - 145 mmol/L   Potassium 3.7 3.5 - 5.1 mmol/L   Chloride 101 101 - 111 mmol/L   BUN 20 6 - 20 mg/dL   Creatinine, Ser 1.20 0.61 - 1.24 mg/dL   Glucose, Bld 104 (H) 65 - 99 mg/dL   Calcium, Ion 1.08 (L) 1.15 - 1.40 mmol/L   TCO2 23 0 - 100 mmol/L   Hemoglobin 15.3 13.0 - 17.0 g/dL   HCT 45.0 39.0 - 52.0 %  I-Stat CG4 Lactic Acid, ED     Status: None   Collection Time: 04/20/16 11:14 AM  Result Value Ref Range   Lactic Acid, Venous 1.18 0.5 - 1.9 mmol/L  C difficile quick scan w PCR reflex     Status: None   Collection Time: 04/20/16  1:16 PM  Result  Value Ref Range   C Diff antigen NEGATIVE NEGATIVE   C Diff toxin NEGATIVE NEGATIVE   C Diff interpretation No C. difficile detected.   I-Stat CG4 Lactic Acid, ED     Status: None   Collection Time: 04/20/16  2:04 PM  Result Value Ref Range   Lactic Acid, Venous 1.30 0.5 - 1.9 mmol/L  Blood culture (routine x 2)     Status: None (Preliminary result)   Collection Time: 04/20/16  4:21 PM  Result Value Ref Range   Specimen Description BLOOD LEFT ANTECUBITAL    Special Requests BOTTLES DRAWN AEROBIC AND ANAEROBIC 5CC    Culture      NO GROWTH < 24 HOURS Performed at [facility]    Report Status PENDING   Blood culture (routine x 2)     Status: None (Preliminary result)   Collection Time: 04/20/16  4:21 PM  Result Value Ref Range   Specimen Description BLOOD RIGHT ANTECUBITAL    Special Requests BOTTLES DRAWN AEROBIC AND ANAEROBIC 5CC    Culture      NO GROWTH < 24 HOURS Performed at [facility]    Report Status PENDING   CBC     Status: Abnormal   Collection Time: 04/21/16  4:25 AM  Result Value Ref Range   WBC 12.0 (H) 4.0 - 10.5 K/uL   RBC 4.11 (L) 4.22 - 5.81 MIL/uL   Hemoglobin 13.0 13.0 - 17.0 g/dL   HCT 38.8 (L) 39.0 - 52.0 %   MCV 94.4 78.0 - 100.0 fL   MCH 31.6 26.0 - 34.0 pg   MCHC 33.5 30.0 - 36.0 g/dL   RDW 13.1 11.5 - 15.5 %   Platelets 355 150 - 400 K/uL    Imaging / Studies: Ct Abdomen Pelvis W Contrast  Result Date: 04/20/2016 CLINICAL DATA:  Recently diagnosed irritable bowel disease. Rectal bleeding for 3 days. History of rectal abscess and fistula. EXAM: CT ABDOMEN AND PELVIS WITH CONTRAST TECHNIQUE: Multidetector CT imaging of the abdomen and pelvis was performed using the standard protocol following bolus administration of intravenous contrast. CONTRAST:  154m ISOVUE-300 IOPAMIDOL (ISOVUE-300) INJECTION 61% COMPARISON:  08/16/2015 FINDINGS: Lower chest: No acute infiltrate or effusion. 1 cm calcified left posterior lower lobe nodule,  presumably a granuloma. Minimal surrounding soft tissue and questionable spiculation is similar compared to prior. Hepatobiliary: Mild diffuse decreased attenuation of the liver, consistent with fatty changes. No focal abnormality. Gallbladder contains no calcified stones. No intra or extrahepatic biliary dilatation. Pancreas: Unremarkable. No pancreatic ductal dilatation or surrounding inflammatory changes. Spleen: Normal in size without focal abnormality. Adrenals/Urinary Tract: Bilateral adrenal glands are within normal limits. Sub cm cortical hypodense lesions in the kidneys too small to further characterize but are unchanged. 1 cm cyst in the midpole of the right kidney, unchanged. No hydronephrosis. Bladder contains a Foley catheter. Mild bilateral nonspecific perinephric fat stranding and edema. Stomach/Bowel: Stomach is nondilated. No dilated small bowel to suggest obstruction. Appendix is visualized and is normal. Multiple irregular perirectal and perianal mildly rim enhancing fluid collections, right greater than left. Collection measures approximately 5.7 cm transverse by 5.7 cm in AP by 7 cm cranial caudad. Moderate surrounding edema and inflammation in the gluteal fat. Extension of fluid and edema to the skin surface, consistent with fistula. Mild edema and soft tissue stranding in the presacral space. Vascular/Lymphatic: Tiny perirectal lymph nodes. Stable left para-aortic 8 mm lymph node at the level of the SMA. Atherosclerotic vascular disease of the aorta. No aneurysm. Reproductive: Prostate gland unremarkable. Other: No free air.  Small fatty containing umbilical hernia. Musculoskeletal: No acute osseous abnormality. Mild retrolisthesis of L1 on L2, L2 on L3, L3 on L4 and L4 on L5. Associated disc space narrowing and vacuum discs.  Canal stenosis of the lower lumbar spine. IMPRESSION: 1. Moderate edema and soft tissue stranding in the perirectal space with irregular, mildly rim enhancing perirectal  and perianal fluid collection, consistent with abscess. Extension of fluid and soft tissue changes to the skin surface suspicious for fistula. 2. Fatty infiltration of the liver. 3. Mild bilateral nonspecific perinephric fat stranding. 4. Small intrapelvic lymph nodes, likely reactive. 5. Calcified left lower lobe nodule with minimal surrounding soft tissue, favor granuloma. Electronically Signed   By: Donavan Foil M.D.   On: 04/20/2016 14:54    Medications / Allergies: per chart  Antibiotics: Anti-infectives    Start     Dose/Rate Route Frequency Ordered Stop   04/21/16 0400  [MAR Hold]  vancomycin (VANCOCIN) IVPB 750 mg/150 ml premix     (MAR Hold since 04/21/16 1214)   750 mg 150 mL/hr over 60 Minutes Intravenous Every 12 hours 04/20/16 1530     04/21/16 0000  [MAR Hold]  piperacillin-tazobactam (ZOSYN) IVPB 3.375 g     (MAR Hold since 04/21/16 1214)   3.375 g 12.5 mL/hr over 240 Minutes Intravenous Every 8 hours 04/20/16 1530     04/20/16 1545  vancomycin (VANCOCIN) 2,000 mg in sodium chloride 0.9 % 500 mL IVPB     2,000 mg 250 mL/hr over 120 Minutes Intravenous  Once 04/20/16 1530 04/20/16 1836   04/20/16 1545  piperacillin-tazobactam (ZOSYN) IVPB 3.375 g     3.375 g 100 mL/hr over 30 Minutes Intravenous  Once 04/20/16 1530 04/20/16 1807        Note: Portions of this report may have been transcribed using voice recognition software. Every effort was made to ensure accuracy; however, inadvertent computerized transcription errors may be present.   Any transcriptional errors that result from this process are unintentional.     Adin Hector, M.D., F.A.C.S. Gastrointestinal and Minimally Invasive Surgery [facility] [address], [place], [place] [address] [phone] Main / Paging   04/21/2016

## 2016-04-21 NOTE — Anesthesia Preprocedure Evaluation (Addendum)
Anesthesia Evaluation  Patient identified by MRN, date of birth, ID band Patient awake    Reviewed: Allergy & Precautions, NPO status , Patient's Chart, lab work & pertinent test results  Airway Mallampati: II  TM Distance: >3 FB     Dental   Pulmonary former smoker,    breath sounds clear to auscultation       Cardiovascular hypertension,  Rhythm:Regular Rate:Normal     Neuro/Psych    GI/Hepatic Neg liver ROS, PUD,   Endo/Other  negative endocrine ROS  Renal/GU negative Renal ROS     Musculoskeletal   Abdominal   Peds  Hematology   Anesthesia Other Findings   Reproductive/Obstetrics                             Anesthesia Physical Anesthesia Plan  ASA: III  Anesthesia Plan: General   Post-op Pain Management:    Induction:   Airway Management Planned: LMA  Additional Equipment:   Intra-op Plan:   Post-operative Plan: Extubation in OR  Informed Consent: I have reviewed the patients History and Physical, chart, labs and discussed the procedure including the risks, benefits and alternatives for the proposed anesthesia with the patient or authorized representative who has indicated his/her understanding and acceptance.     Plan Discussed with: CRNA and Anesthesiologist  Anesthesia Plan Comments:         Anesthesia Quick Evaluation

## 2016-04-21 NOTE — Progress Notes (Addendum)
Progress Note   Subjective  Chief Complaint: Perirectal abscess and ulcerative colitis  This morning, patient does ask some questions in regards to his recovery time from his upcoming surgical procedure today. He tells me that he has continued with rectal pain and multiple bowel movements through the night. Per him and his wife they would appreciate it if colonoscopy was delayed.    Objective   Vital signs in last 24 hours: Temp:  [98 F (36.7 C)-99.3 F (37.4 C)] 98 F (36.7 C) (10/13 0645) Pulse Rate:  [75-93] 75 (10/13 0645) Resp:  [16-22] 18 (10/13 0645) BP: (115-136)/(68-77) 121/70 (10/13 0645) SpO2:  [94 %-100 %] 97 % (10/13 0645) Last BM Date: 04/21/16 General:Caucasian male in NAD Heart:  Regular rate and rhythm; no murmurs Lungs: Respirations even and unlabored, lungs CTA bilaterally Abdomen:  Soft, nontender and nondistended. Normal bowel sounds. Extremities:  Without edema. Neurologic:  Alert and oriented,  grossly normal neurologically. Psych:  Cooperative. Normal mood and affect.  Intake/Output from previous day: 10/12 0701 - 10/13 0700 In: 713.7 [P.O.:240; I.V.:223.7; IV Piggyback:250] Out: 2750 [Urine:2750] Intake/Output this shift: Total I/O In: 0  Out: 575 [Urine:575]  Lab Results:  Recent Labs  04/20/16 1054 04/20/16 1113 04/21/16 0425  WBC 13.8*  --  12.0*  HGB 14.2 15.3 13.0  HCT 41.6 45.0 38.8*  PLT 341  --  355   BMET  Recent Labs  04/20/16 1054 04/20/16 1113  NA 133* 136  K 3.7 3.7  CL 102 101  CO2 22  --   GLUCOSE 106* 104*  BUN 22* 20  CREATININE 1.15 1.20  CALCIUM 8.4*  --    LFT  Recent Labs  04/20/16 1054  PROT 7.5  ALBUMIN 2.8*  AST 30  ALT 45  ALKPHOS 44  BILITOT 1.1   PT/INR  Recent Labs  04/20/16 1054  LABPROT 15.9*  INR 1.26      Assessment / Plan:    Assessment: 1. Inflammatory bowel disease: Originally diagnosed in March of this year via biopsy, complicated currently by perianal  fistula/abscess, question if this is truly Crohn's versus ulcerative colitis as previously thought, patient has been maintained on Lialda 2.4 g daily until 3 days ago when he increase this to 4.8 g per day, order for Pentasa has been given, patient has not picked this up, per Dr. Silverio Decamp she would like further investigation with colonoscopy in the future to better evaluate disease state, currently patient is set to undergo I&D under anesthesia today for his rectal abscess/fistula 2. Rectal abscess/ Fistula: First appeared 2 weeks ago, diagnosed by PCP, patient had I&D on 04/13/16 with little relief, currently not in as much pain from this as previously, CT yesterday as above, plans for procedure today with surgical team  Plan: 1. Surgical team will be performing I&D under anesthesia today 2. We are holding off on colonoscopy at this time, likely this will not occur during this admission but will need to be set up as an outpatient in the future 3. Per Dr. Carlean Purl we will also cancel hemorrhoid banding in the future 4. Will discuss above with Dr. Carlean Purl, please await any further recommendations  Thank you for your kind consultation, we will continue to follow  LOS: 0 days   Levin Erp  04/21/2016, 10:59 AM  Pager # [phone]    [facility] Attending   I have taken an interval history, reviewed the chart and examined the patient. I agree with the  Advanced Practitioner's note, impression and recommendations.   Plan for now is to recover from this surgery - then will need different IBD Tx - ? Biologics -   Will get a quantiferon and hep B S ag and core ab on him while here  He had been having urgency and some incontinence in past few weeks and is concerned about that - I think could be related to the compression of rectum by the abscesses. Will reassess tomorrow on that - could need an anti-diarrheal.  Might be going home tomorrow - will need f/u appt w/ Dr. Silverio Decamp unless has  something.  Gatha Mayer, MD, [facility] [phone] (pager) [phone] after 5 PM, weekends and holidays  04/21/2016 5:12 PM

## 2016-04-21 NOTE — Progress Notes (Signed)
Patient has home cpap.(NOTE) Patient wanted tubing around the IV pole so I explained to him  that  it could fall off but he said it would not and that its okay that way.

## 2016-04-21 NOTE — Care Management Note (Signed)
Case Management Note  Patient Details  Name: Jesse English MRN: [id number] Date of Birth: 01/13/1961  Subjective/Objective: 54 y/o m admitted w/peri rectal abscess, s/p I&D. From home.                   Action/Plan:d/c plan home.   Expected Discharge Date:   (unknown)               Expected Discharge Plan:  Home/Self Care  In-House Referral:     Discharge planning Services  CM Consult  Post Acute Care Choice:    Choice offered to:     DME Arranged:    DME Agency:     HH Arranged:    HH Agency:     Status of Service:  In process, will continue to follow  If discussed at Long Length of Stay Meetings, dates discussed:    Additional Comments:  Dessa Phi, RN 04/21/2016, 3:42 PM

## 2016-04-21 NOTE — Discharge Instructions (Signed)
WOUND CARE  It is important that the wound be kept open.   -Keeping the skin edges apart will allow the wound to gradually heal from the base upwards.   - If the skin edges of the wound close too early, a new fluid pocket can form and infection can occur. -This is the reason to pack deeper wounds with gauze or ribbon -This is why drained wounds cannot be sewed closed right away  A healthy wound should form a lining of bright red "beefy" granulating tissue that will help shrink the wound and help the edges grow new skin into it.   -A little mucus / yellow discharge is normal (the body's natural way to try and form a scab) and should be gently washed off with soap and water with daily dressing changes.  -Green or foul smelling drainage implies bacterial colonization and can slow wound healing - a short course of antibiotic ointment (3-5 days) can help it clear up.  Call the doctor if it does not improve or worsens  -Avoid use of antibiotic ointments for more than a week as they can slow wound healing over time.    -Sometimes other wound care products will be used to reduce need for dressing changes and/or help clean up dirty wounds -Sometimes the surgeon needs to debride the wound in the office to remove dead or infected tissue out of the wound so it can heal more quickly and safely.    Change the dressing at least once a day -Wash the wound with mild soap and water gently every day.  It is good to shower or bathe the wound to help it clean out. -Use clean 4x4 gauze for medium/large wounds or ribbon plain NU-gauze for smaller wounds (it does not need to be sterile, just clean) -Keep the raw wound moist with a little saline or KY (saline) gel on the gauze.  -A dry wound will take longer to heal.  -Keep the skin dry around the wound to prevent breakdown and irritation. -Pack the wound down to the base -The goal is to keep the skin apart, not overpack the wound -Use a Q-tip or blunt-tipped kabob  stick toothpick to push the gauze down to the base in narrow or deep wounds   -Cover with a clean gauze and tape -paper or Medipore tape tend to be gentle on the skin -rotate the orientation of the tape to avoid repeated stress/trauma on the skin -using an ACE or Coban wrap on wounds on arms or legs can be used instead.  Complete all antibiotics through the entire prescription to help the infection heal and prevent new places of infection   Returning the see the surgeon is helpful to follow the healing process and help the wound close as fast as possible.   ANORECTAL SURGERY:  POST OPERATIVE INSTRUCTIONS  ######################################################################  EAT Gradually transition to a high fiber diet with a fiber supplement over the next few weeks after discharge.  Start with a pureed / full liquid diet (see below)  WALK Walk an hour a day.  Control your pain to do that.    CONTROL PAIN Control pain so that you can walk, sleep, tolerate sneezing/coughing, go up/down stairs.  HAVE A BOWEL MOVEMENT DAILY Keep your bowels regular to avoid problems.  OK to try a laxative to override constipation.  OK to use an antidairrheal to slow down diarrhea.  Call if not better after 2 tries  CALL IF YOU HAVE PROBLEMS/CONCERNS Call if  you are still struggling despite following these instructions. Call if you have concerns not answered by these instructions  ######################################################################    1. Take your usually prescribed home medications unless otherwise directed. 2. DIET: Follow a light bland diet the first 24 hours after arrival home, such as soup, liquids, crackers, etc.  Be sure to include lots of fluids daily.  Avoid fast food or heavy meals as your are more likely to get nauseated.  Eat a low fat the next few days after surgery.   3. PAIN CONTROL: a. Pain is best controlled by a usual combination of three different methods  TOGETHER: i. Ice/Heat ii. Over the counter pain medication iii. Prescription pain medication b. Most patients will experience some swelling and discomfort in the anus/rectal area. and incisions.  Ice packs or heat (30-60 minutes up to 6 times a day) will help. Use ice for the first few days to help decrease swelling and bruising, then switch to heat such as warm towels, sitz baths, warm baths, etc to help relax tight/sore spots and speed recovery.  Some people prefer to use ice alone, heat alone, alternating between ice & heat.  Experiment to what works for you.  Swelling and bruising can take several weeks to resolve.   c. It is helpful to take an over-the-counter pain medication regularly for the first few weeks.  Choose one of the following that works best for you: i. Naproxen (Aleve, etc)  Two 236m tabs twice a day ii. Ibuprofen (Advil, etc) Three 2070mtabs four times a day (every meal & bedtime) iii. Acetaminophen (Tylenol, etc) 500-65075mour times a day (every meal & bedtime) d. A  prescription for pain medication (such as oxycodone, hydrocodone, etc) should be given to you upon discharge.  Take your pain medication as prescribed.  i. If you are having problems/concerns with the prescription medicine (does not control pain, nausea, vomiting, rash, itching, etc), please call us [phone] see if we need to switch you to a different pain medicine that will work better for you and/or control your side effect better. ii. If you need a refill on your pain medication, please contact your pharmacy.  They will contact our office to request authorization. Prescriptions will not be filled after 5 pm or on week-ends.  Use a Sitz Bath 4-8 times a day for relief   [employer]itz bath is a warm water bath taken in the sitting position that covers only the hips and buttocks. It may be used for either healing or hygiene purposes. Sitz baths are also used to relieve pain, itching, or muscle spasms.  The water may contain medicine. Moist heat will help you heal and relax.  HOME CARE INSTRUCTIONS  Take 3 to 4 sitz baths a day. 1. Fill the bathtub half full with warm water. 2. Sit in the water and open the drain a little. 3. Turn on the warm water to keep the tub half full. Keep the water running constantly. 4. Soak in the water for 15 to 20 minutes. 5. After the sitz bath, pat the affected area dry first.   4. KEEP YOUR BOWELS REGULAR a. The goal is one bowel movement a day b. Avoid getting constipated.  Between the surgery and the pain medications, it is common to experience some constipation.  Increasing fluid intake and taking a fiber supplement (such as Metamucil, Citrucel, FiberCon, MiraLax, etc) 1-2 times a day regularly will usually help prevent this problem from occurring.  A mild laxative (prune juice, Milk of Magnesia, MiraLax, etc) should be taken according to package directions if there are no bowel movements after 48 hours. c. Watch out for diarrhea.  If you have many loose bowel movements, simplify your diet to bland foods & liquids for a few days.  Stop any stool softeners and decrease your fiber supplement.  Switching to mild anti-diarrheal medications (Kayopectate, Pepto Bismol) can help.  If this worsens or does not improve, please call us.  5. Wound Care  a. Remove your bandages the day after surgery.  Unless discharge instructions indicate otherwise, leave your bandage dry and in place overnight.  Remove the bandage during your first bowel movement.   b. Wear an absorbent pad or soft cotton gauze in your underwear as needed to catch any drainage and help keep the area  c. Keep the area clean and dry.  Bathe / shower every day.  Keep the area clean by showering / bathing over the incision / wound.   It is okay to soak an open wound to help wash it.  Wet wipes or showers / gentle washing after bowel movements is often less traumatic than regular toilet paper. d. Dennis Bast will  often notice bleeding with bowel movements.  This should slow down by the end of the first week of surgery e. Expect some drainage.  This should slow down, too, by the end of the first week of surgery.  Wear an absorbent pad or soft cotton gauze in your underwear until the drainage stops.  6. ACTIVITIES as tolerated:   a. You may resume regular (light) daily activities beginning the next day--such as daily self-care, walking, climbing stairs--gradually increasing activities as tolerated.  If you can walk 30 minutes without difficulty, it is safe to try more intense activity such as jogging, treadmill, bicycling, low-impact aerobics, swimming, etc. b. Save the most intensive and strenuous activity for last such as sit-ups, heavy lifting, contact sports, etc  Refrain from any heavy lifting or straining until you are off narcotics for pain control.   c. DO NOT PUSH THROUGH PAIN.  Let pain be your guide: If it hurts to do something, don't do it.  Pain is your body warning you to avoid that activity for another week until the pain goes down. d. You may drive when you are no longer taking prescription pain medication, you can comfortably sit for long periods of time, and you can safely maneuver your car and apply brakes. e. Dennis Bast may have sexual intercourse when it is comfortable.  7. FOLLOW UP in our office a. Please call [facility] at [phone] to set up an appointment to see your surgeon in the office for a follow-up appointment approximately 2 weeks after your surgery. b. Make sure that you call for this appointment the day you arrive home to insure a convenient appointment time. 10. IF YOU HAVE DISABILITY OR FAMILY LEAVE FORMS, BRING THEM TO THE OFFICE FOR PROCESSING.  DO NOT GIVE THEM TO YOUR DOCTOR.        WHEN TO CALL us [phone]: 1. Poor pain control 2. Reactions / problems with new medications (rash/itching, nausea, etc)  3. Fever over 101.5 F (38.5 C) 4. Inability to urinate 5. Nausea  and/or vomiting 6. Worsening swelling or bruising 7. Continued bleeding from incision. 8. Increased pain, redness, or drainage from the incision  The clinic staff is available to answer your questions during regular business hours (8:30am-5pm).  Please dont hesitate to  call and ask to speak to one of our nurses for clinical concerns.   A surgeon from [facility] is always on call at the hospitals   If you have a medical emergency, go to the nearest emergency room or call 911.    [facility], [address], [place], [place], [place]  [address] ? MAIN: [phone] ? TOLL FREE: [phone] ? FAX [phone] [email or web address]   Colitis Colitis is inflammation of the colon. Colitis may last a short time (acute) or it may last a long time (chronic). CAUSES This condition may be caused by:  Viruses.  Bacteria.  Reactions to medicine.  Certain autoimmune diseases, such as Crohn disease or ulcerative colitis. SYMPTOMS Symptoms of this condition include:  Diarrhea.  Passing bloody or tarry stool.  Pain.  Fever.  Vomiting.  Tiredness (fatigue).  Weight loss.  Bloating.  Sudden increase in abdominal pain.  Having fewer bowel movements than usual. DIAGNOSIS This condition is diagnosed with a stool test or a blood test. You may also have other tests, including X-rays, a CT scan, or a colonoscopy. TREATMENT Treatment may include:  Resting the bowel. This involves not eating or drinking for a period of time.  Fluids that are given through an IV tube.  Medicine for pain and diarrhea.  Antibiotic medicines.  Cortisone medicines.  Surgery. HOME CARE INSTRUCTIONS Eating and Drinking  Follow instructions from your health care provider about eating or drinking restrictions.  Drink enough fluid to keep your urine clear or pale yellow.  Work with a dietitian to determine which foods cause your condition to  flare up.  Avoid foods that cause flare-ups.  Eat a well-balanced diet. Medicines  Take over-the-counter and prescription medicines only as told by your health care provider.  If you were prescribed an antibiotic medicine, take it as told by your health care provider. Do not stop taking the antibiotic even if you start to feel better. General Instructions  Keep all follow-up visits as told by your health care provider. This is important. SEEK MEDICAL CARE IF:  Your symptoms do not go away.  You develop new symptoms. SEEK IMMEDIATE MEDICAL CARE IF:  You have a fever that does not go away with treatment.  You develop chills.  You have extreme weakness, fainting, or dehydration.  You have repeated vomiting.  You develop severe pain in your abdomen.  You pass bloody or tarry stool.   This information is not intended to replace advice given to you by your health care provider. Make sure you discuss any questions you have with your health care provider.   Document Released: 08/03/2004 Document Revised: 03/17/2015 Document Reviewed: 10/19/2014 Elsevier Interactive Patient Education [employer].

## 2016-04-21 NOTE — Op Note (Signed)
04/21/2016  2:37 PM  PATIENT:  Jesse English  55 y.o. male  Patient Care Team: Marton Redwood, MD as PCP - General (Internal Medicine)  PRE-OPERATIVE DIAGNOSIS:  perirectal abscess  POST-OPERATIVE DIAGNOSIS:   Horseshoe perirectal abscess with Fournier's gangrene Grade 4 prolapsed hemorrhoid  PROCEDURE:   INCISION AND DRAINAGE OF HORSESHOE PERIRECTAL ABSCESS with FOURNIER GANGRENE  HEMORRHOIDECTOMY  SURGEON:  Surgeon(s): Michael Boston, MD  ASSISTANT: RN   ANESTHESIA:   local and general  EBL:  Total I/O In: 800 [I.V.:800] Out: 825 [Urine:625; Blood:200]  Delay start of Pharmacological VTE agent (>24hrs) due to surgical blood loss or risk of bleeding:  no  DRAINS: Penrose drain in the LEFT ANTERIOR PERIRECTAL WOUND   SPECIMEN:  Source of Specimen:  LEFT POSTERIOR PROLAPSED HEMORRHOID  DISPOSITION OF SPECIMEN:  PATHOLOGY  COUNTS:  YES  PLAN OF CARE: Admit to inpatient   PATIENT DISPOSITION:  PACU - hemodynamically stable.  INDICATION: Patient with most colitis with persistent swelling and pain concerning for chronic perirectal abscess.  A two incision and drainage in the office last week.  Concern for deeper abscess on CT scan.  Admitted.  Recommendation made for examination under anesthesia with incision and drainage  The anatomy and physiology of perirectal abscesses was discussed. Pathophysiology of SQ abscess, possible progression to fasciitis & sepsis, etc discussed . I stressed good hygiene & wound care. Possible redebridement was discussed as well.   Possibility of recurrence was discussed. Risks, benefits, alternatives were discussed. I noted a good likelihood this will help address the problem. Risks of anesthesia and other risks discussed. Questions answered. The patient is does wish to proceed.   OR FINDINGS:  Chronic deep cavity with some purulence consistent with chronic horseshoe shaped abscess going from primarily right anterior aspect across the  midline raphae towards the left perirectal region.  Continuing up the perineum to the level of the inguinal region.  Suspicious for Fournier's gangrene.  Wound measures 25 cm by 18 cm.  It is 10 cm deep   DESCRIPTION:   Informed consent was confirmed. The patient received IV antibiotics. The patient underwent general anesthesia without any difficulty. The patient was positioned in high lithotomy. SCDs were active during the entire case. The area around the abscess was prepped and draped in a sterile fashion. A surgical timeout confirmed our plan.   Patient had right anterior significant fluctuance in the perirectal region going up the perineum towards the base of the scrotum.  I did aspiration with an 18-gauge needle and got some thinly bloody purulent fluid.  I made an incision over the most fluctuant area of the mass in the right anterior perirectal/perineal region..   I placed my finger into the wound and encountered a large abscess cavity involving the right side of the perirectal region going down posteriolaterally easily went across the midline raphae over to the left anterior aspect consistent with a large horseshoe pain abscess.  I made a 3 cm counterincision the left anterior aspect.  Permanent.  Went a little bit posteriorly but was intact on the perineum and laterally.  I explored the wound anteriorly.  I could easily  opened up along the fascia of the perineum and over the proximal thigh to the inguinal region and the subcutaneous tissues.  I opened up the wound on the right anterior aspect 9 cm to have plenty of  opening.  Did encounter some bleeding more deeply lateral to the right anterior lateral rectal wall and base of  the pelvic floor.  Did do a figure-of-eight Vicryl sutures x2 to some perforating vessels for good control.  Packed.  He had a large left posterior lateral chronically prolapsed & inflamed hemorrhoid with inflamed tag and some question of thrombosis.  I excised that   longitudinally with a fusiform excision.  He did 2-0 Vicryl running locking suture  up in the rectum about 5 cm proximal to anal verge in and ran it down to help close the wound and provide  Hemostasis ais well.  Remove packing and did copious irrigation.   He had been quite using inflamed but after releasing and packing hemostasis was improved.  I did meticulous inspection to make sure that hemostasis was good. Packed the wound with  two rolls of Kerlix.    I did place a half inch wide Penrose drain through the left anterior  perirectal wound to help let the left anterior area drain without overpacking it.  Secured that at the skin with a silk suture to help keep that area available for drainage but simplify packing to just one large wound.  Did local anesthetic block with bupivacaine and Experel.  Patient extubated to recovery room.  We plan to continue IV antibiotics and begin wound care training tomorrow.  I am about to discuss OR findings with family per the patient's request   Adin Hector, M.D., F.A.C.S. Gastrointestinal and Minimally Invasive Surgery [facility] [address], [place], [place] [address] [phone] Main / Paging

## 2016-04-21 NOTE — Transfer of Care (Signed)
Immediate Anesthesia Transfer of Care Note  Patient: Jesse English  Procedure(s) Performed: Procedure(s): INCISION AND DRAINAGE OF HORSESHOE  FOURNIER GANGRENE PERIRECTAL ABSCESS (N/A) HEMORRHOIDECTOMY  Patient Location: PACU  Anesthesia Type:General  Level of Consciousness:  sedated, patient cooperative and responds to stimulation  Airway & Oxygen Therapy:Patient Spontanous Breathing and Patient connected to face mask oxgen  Post-op Assessment:  Report given to PACU RN and Post -op Vital signs reviewed and stable  Post vital signs:  Reviewed and stable  Last Vitals:  Vitals:   04/20/16 2240 04/21/16 0645  BP: 135/71 121/70  Pulse: 82 75  Resp: 18 18  Temp: 37.4 C 123XX123 C    Complications: No apparent anesthesia complications

## 2016-04-21 NOTE — Anesthesia Postprocedure Evaluation (Signed)
Anesthesia Post Note  Patient: FULGENCIO KRENGEL  Procedure(s) Performed: Procedure(s) (LRB): INCISION AND DRAINAGE OF HORSESHOE  FOURNIER GANGRENE PERIRECTAL ABSCESS (N/A) HEMORRHOIDECTOMY  Patient location during evaluation: PACU Anesthesia Type: General Level of consciousness: awake Pain management: pain level controlled Vital Signs Assessment: post-procedure vital signs reviewed and stable Respiratory status: spontaneous breathing Cardiovascular status: stable Anesthetic complications: no    Last Vitals:  Vitals:   04/21/16 1637 04/21/16 1734  BP: 104/77 119/64  Pulse: 79 81  Resp: 15 16  Temp: 36.7 C 36.5 C    Last Pain:  Vitals:   04/21/16 1734  TempSrc: Oral  PainSc:                  EDWARDS,Laikynn Pollio

## 2016-04-22 ENCOUNTER — Encounter (HOSPITAL_COMMUNITY): Payer: Self-pay | Admitting: Internal Medicine

## 2016-04-22 DIAGNOSIS — R338 Other retention of urine: Secondary | ICD-10-CM

## 2016-04-22 MED ORDER — TAMSULOSIN HCL 0.4 MG PO CAPS: 0.4000 mg | ORAL_CAPSULE | Freq: Every day | ORAL | Status: DC

## 2016-04-22 MED ORDER — AMLODIPINE BESYLATE 5 MG PO TABS
2.5000 mg | ORAL_TABLET | Freq: Every day | ORAL | Status: DC
  Administered 2016-04-23 – 2016-04-24 (×2): 2.5 mg via ORAL
  Filled 2016-04-22 (×2): qty 1

## 2016-04-22 MED ORDER — LOPERAMIDE HCL 2 MG PO CAPS
2.0000 mg | ORAL_CAPSULE | Freq: Three times a day (TID) | ORAL | Status: DC | PRN
  Administered 2016-04-22 – 2016-04-23 (×3): 4 mg via ORAL
  Filled 2016-04-22: qty 2
  Filled 2016-04-22: qty 1
  Filled 2016-04-22: qty 2
  Filled 2016-04-22: qty 1

## 2016-04-22 MED ORDER — LACTATED RINGERS IV BOLUS (SEPSIS): 1000.0000 mL | Freq: Three times a day (TID) | INTRAVENOUS | Status: AC | PRN

## 2016-04-22 MED ORDER — LACTATED RINGERS IV BOLUS (SEPSIS)
1000.0000 mL | Freq: Once | INTRAVENOUS | Status: AC
  Administered 2016-04-22: 1000 mL via INTRAVENOUS

## 2016-04-22 MED ORDER — BISMUTH SUBSALICYLATE 262 MG/15ML PO SUSP
30.0000 mL | Freq: Three times a day (TID) | ORAL | Status: DC | PRN
  Filled 2016-04-22: qty 118

## 2016-04-22 NOTE — Progress Notes (Signed)
1 Day Post-Op  Subjective: Complains of soreness at the bottom  Objective: Vital signs in last 24 hours: Temp:  [97.7 F (36.5 C)-98.4 F (36.9 C)] 97.8 F (36.6 C) (10/14 0524) Pulse Rate:  [70-92] 70 (10/14 0524) Resp:  [14-18] 18 (10/14 0524) BP: (97-128)/(64-77) 128/69 (10/14 0524) SpO2:  [98 %-100 %] 100 % (10/14 0524) Last BM Date: 04/21/16  Intake/Output from previous day: 10/13 0701 - 10/14 0700 In: 5830 [P.O.:840; I.V.:4690; IV Piggyback:300] Out: 2450 [Urine:2250; Blood:200] Intake/Output this shift: Total I/O In: 240 [P.O.:240] Out: 300 [Urine:300]  Resp: clear to auscultation bilaterally Cardio: regular rate and rhythm GI: soft, nontender Pelvic: open rectal wound with moderate drainage  Lab Results:   Recent Labs  04/20/16 1054 04/20/16 1113 04/21/16 0425  WBC 13.8*  --  12.0*  HGB 14.2 15.3 13.0  HCT 41.6 45.0 38.8*  PLT 341  --  355   BMET  Recent Labs  04/20/16 1054 04/20/16 1113  NA 133* 136  K 3.7 3.7  CL 102 101  CO2 22  --   GLUCOSE 106* 104*  BUN 22* 20  CREATININE 1.15 1.20  CALCIUM 8.4*  --    PT/INR  Recent Labs  04/20/16 1054  LABPROT 15.9*  INR 1.26   ABG No results for input(English): PHART, HCO3 in the last 72 hours.  Invalid input(English): PCO2, PO2  Studies/Results: Ct Abdomen Pelvis W Contrast  Result Date: 04/20/2016 CLINICAL DATA:  Recently diagnosed irritable bowel disease. Rectal bleeding for 3 days. History of rectal abscess and fistula. EXAM: CT ABDOMEN AND PELVIS WITH CONTRAST TECHNIQUE: Multidetector CT imaging of the abdomen and pelvis was performed using the standard protocol following bolus administration of intravenous contrast. CONTRAST:  168m ISOVUE-300 IOPAMIDOL (ISOVUE-300) INJECTION 61% COMPARISON:  08/16/2015 FINDINGS: Lower chest: No acute infiltrate or effusion. 1 cm calcified left posterior lower lobe nodule, presumably a granuloma. Minimal surrounding soft tissue and questionable spiculation is  similar compared to prior. Hepatobiliary: Mild diffuse decreased attenuation of the liver, consistent with fatty changes. No focal abnormality. Gallbladder contains no calcified stones. No intra or extrahepatic biliary dilatation. Pancreas: Unremarkable. No pancreatic ductal dilatation or surrounding inflammatory changes. Spleen: Normal in size without focal abnormality. Adrenals/Urinary Tract: Bilateral adrenal glands are within normal limits. Sub cm cortical hypodense lesions in the kidneys too small to further characterize but are unchanged. 1 cm cyst in the midpole of the right kidney, unchanged. No hydronephrosis. Bladder contains a Foley catheter. Mild bilateral nonspecific perinephric fat stranding and edema. Stomach/Bowel: Stomach is nondilated. No dilated small bowel to suggest obstruction. Appendix is visualized and is normal. Multiple irregular perirectal and perianal mildly rim enhancing fluid collections, right greater than left. Collection measures approximately 5.7 cm transverse by 5.7 cm in AP by 7 cm cranial caudad. Moderate surrounding edema and inflammation in the gluteal fat. Extension of fluid and edema to the skin surface, consistent with fistula. Mild edema and soft tissue stranding in the presacral space. Vascular/Lymphatic: Tiny perirectal lymph nodes. Stable left para-aortic 8 mm lymph node at the level of the SMA. Atherosclerotic vascular disease of the aorta. No aneurysm. Reproductive: Prostate gland unremarkable. Other: No free air.  Small fatty containing umbilical hernia. Musculoskeletal: No acute osseous abnormality. Mild retrolisthesis of L1 on L2, L2 on L3, L3 on L4 and L4 on L5. Associated disc space narrowing and vacuum discs. Canal stenosis of the lower lumbar spine. IMPRESSION: 1. Moderate edema and soft tissue stranding in the perirectal space with irregular, mildly rim  enhancing perirectal and perianal fluid collection, consistent with abscess. Extension of fluid and soft  tissue changes to the skin surface suspicious for fistula. 2. Fatty infiltration of the liver. 3. Mild bilateral nonspecific perinephric fat stranding. 4. Small intrapelvic lymph nodes, likely reactive. 5. Calcified left lower lobe nodule with minimal surrounding soft tissue, favor granuloma. Electronically Signed   By: Donavan Foil M.D.   On: 04/20/2016 14:54    Anti-infectives: Anti-infectives    Start     Dose/Rate Route Frequency Ordered Stop   04/21/16 0400  vancomycin (VANCOCIN) IVPB 750 mg/150 ml premix     750 mg 150 mL/hr over 60 Minutes Intravenous Every 12 hours 04/20/16 1530     04/21/16 0000  piperacillin-tazobactam (ZOSYN) IVPB 3.375 g     3.375 g 12.5 mL/hr over 240 Minutes Intravenous Every 8 hours 04/20/16 1530     04/20/16 1545  vancomycin (VANCOCIN) 2,000 mg in sodium chloride 0.9 % 500 mL IVPB     2,000 mg 250 mL/hr over 120 Minutes Intravenous  Once 04/20/16 1530 04/20/16 1836   04/20/16 1545  piperacillin-tazobactam (ZOSYN) IVPB 3.375 g     3.375 g 100 mL/hr over 30 Minutes Intravenous  Once 04/20/16 1530 04/20/16 1807      Assessment/Plan: [name] Procedure(English): INCISION AND DRAINAGE OF HORSESHOE  FOURNIER GANGRENE PERIRECTAL ABSCESS (N/A) HEMORRHOIDECTOMY Advance diet  Start dressing changes today Continue vanc/zosyn  LOS: 1 day    Jesse English,Jesse English 04/22/2016

## 2016-04-22 NOTE — Progress Notes (Signed)
   Patient Name: Jesse English Date of Encounter: 04/22/2016, 9:56 AM    Subjective  No sig fecal urgency   Objective  BP 128/69 (BP Location: Left Arm)   Pulse 70   Temp 97.8 F (36.6 C) (Oral)   Resp 18   Ht 5' 11"  (1.803 m)   Wt 225 lb (102.1 kg)   SpO2 100%   BMI 31.38 kg/m  NAD    Assessment and Plan  Colitis - Crohn's suspected in light of perirectal abscess  He is improving Will need to sort out IBD Tx plans after recovers from abscess  Gatha Mayer, MD, [facility] [phone]) [phone] after 5 PM, weekends and holidays  04/22/2016 9:56 AM

## 2016-04-22 NOTE — Progress Notes (Signed)
Jesse English  09-16-1960 [id number]  Patient Care Team: Marton Redwood, MD as PCP - General (Internal Medicine) Mauri Pole, MD as Consulting Physician (Gastroenterology) Ardis Hughs, MD as Consulting Physician (Urology)  This patient is a 55 y.o.male who calls today for surgical evaluation.   Nursing concern the patient felt lightheaded while in the shower.  Increased heart rate and lower blood pressure.  Feeling better in the bed.  Heart rate 80s.  Blood pressure 80s to 90s.  Feeling better - getting LR 1L bolus IV fluids.  Suspect may be orthostatic hypotension and related to recently starting the Flomax.  Would stop that.  Lower amlodipine with PRN back up.  Give volume.  Follow.  If it is a persistent problem, consult medicine to evaluate to see if he needs further cardiac or other evaluation.  Patient had a Foley for a week now.  May consider removal and voiding trial on Monday since Fournier's gangrene resolved.  Otherwise outpatient follow-up with urology which apparently had artery been arranged.  Patient Active Problem List   Diagnosis Date Noted  . Acute urinary retention s/p Foley 04/15/2016 04/22/2016  . Fournier's gangrene in male s/p I&D 04/21/2016 04/21/2016  . Ulcerative colitis without complications ([place]) 18/55/0158    Past Medical History:  Diagnosis Date  . Colon polyps    2013  . Crohn's colitis ([place])    vs UC with abscess  . Diverticulosis   . Fournier's gangrene in male 04/2016  . Gout   . Hypertension   . Perirectal abscess 04/2016  . Prolapsed internal hemorrhoid, grade 4   . Sleep apnea    cpap    Past Surgical History:  Procedure Laterality Date  . ANAL FISSURE REPAIR    . COLONOSCOPY    . HEMORRHOID SURGERY  04/21/2016   Procedure: HEMORRHOIDECTOMY;  Surgeon: Michael Boston, MD;  Location: [facility];  Service: General;;  . INCISION AND DRAINAGE ABSCESS N/A 04/21/2016   Procedure: INCISION AND DRAINAGE OF HORSESHOE  FOURNIER GANGRENE  PERIRECTAL ABSCESS;  Surgeon: Michael Boston, MD;  Location: [facility];  Service: General;  Laterality: N/A;    Social History   Social History  . Marital status: Single    Spouse name: N/A  . Number of children: N/A  . Years of education: N/A   Occupational History  . Not on file.   Social History Main Topics  . Smoking status: Former Smoker    Types: Cigars    Quit date: 07/10/2013  . Smokeless tobacco: Never Used  . Alcohol use No     Comment: 2 per day  . Drug use: No  . Sexual activity: Not on file   Other Topics Concern  . Not on file   Social History Narrative  . No narrative on file    Family History  Problem Relation Age of Onset  . Colon cancer Neg Hx   . Esophageal cancer Neg Hx   . Stomach cancer Neg Hx   . Rectal cancer Neg Hx     Current Facility-Administered Medications  Medication Dose Route Frequency Provider Last Rate Last Dose  . acetaminophen (TYLENOL) tablet 1,000 mg  1,000 mg Oral TID Michael Boston, MD   1,000 mg at 04/22/16 1015  . alum & mag hydroxide-simeth (MAALOX/MYLANTA) 200-200-20 MG/5ML suspension 30 mL  30 mL Oral Q6H PRN Michael Boston, MD      . Derrill Memo ON 04/23/2016] amLODipine (NORVASC) tablet 2.5 mg  2.5 mg Oral Daily Michael Boston,  MD      . bismuth subsalicylate (PEPTO BISMOL) 262 MG/15ML suspension 30 mL  30 mL Oral Q8H PRN Michael Boston, MD      . diphenhydrAMINE (BENADRYL) capsule 25 mg  25 mg Oral Q6H PRN Jerrye Beavers, PA-C       Or  . diphenhydrAMINE (BENADRYL) injection 25 mg  25 mg Intravenous Q6H PRN Jerrye Beavers, PA-C      . HYDROmorphone (DILAUDID) injection 0.5-2 mg  0.5-2 mg Intravenous Q1H PRN Michael Boston, MD      . HYDROmorphone (DILAUDID) injection 1 mg  1 mg Intravenous Once Duffy Bruce, MD      . Influenza vac split quadrivalent PF (FLUARIX) injection 0.5 mL  0.5 mL Intramuscular Tomorrow-1000 Duffy Bruce, MD      . lactated ringers bolus 1,000 mL  1,000 mL Intravenous Q8H PRN Michael Boston, MD      . lactated  ringers bolus 1,000 mL  1,000 mL Intravenous Once Michael Boston, MD      . lactated ringers bolus 1,000 mL  1,000 mL Intravenous Q8H PRN Michael Boston, MD      . lip balm (CARMEX) ointment 1 application  1 application Topical BID Michael Boston, MD   1 application at 79/02/40 1015  . loperamide (IMODIUM) capsule 2-4 mg  2-4 mg Oral Q8H PRN Michael Boston, MD      . magic mouthwash  15 mL Oral QID PRN Michael Boston, MD      . menthol-cetylpyridinium (CEPACOL) lozenge 3 mg  1 lozenge Oral PRN Michael Boston, MD      . mesalamine (LIALDA) EC tablet 2.4 g  2.4 g Oral BID Michael Boston, MD   2.4 g at 04/22/16 1013  . methocarbamol (ROBAXIN) tablet 500 mg  500 mg Oral Q6H PRN Jerrye Beavers, PA-C      . metoprolol tartrate (LOPRESSOR) tablet 12.5 mg  12.5 mg Oral Q12H PRN Michael Boston, MD      . ondansetron (ZOFRAN-ODT) disintegrating tablet 4 mg  4 mg Oral Q6H PRN Jerrye Beavers, PA-C       Or  . ondansetron (ZOFRAN) injection 4 mg  4 mg Intravenous Q6H PRN Jerrye Beavers, PA-C   4 mg at 04/21/16 1317  . oxyCODONE (Oxy IR/ROXICODONE) immediate release tablet 5-10 mg  5-10 mg Oral Q4H PRN Jerrye Beavers, PA-C   5 mg at 04/22/16 0538  . phenol (CHLORASEPTIC) mouth spray 2 spray  2 spray Mouth/Throat PRN Michael Boston, MD      . piperacillin-tazobactam (ZOSYN) IVPB 3.375 g  3.375 g Intravenous Q8H Adrian Saran, RPH   3.375 g at 04/22/16 1022  . polyethylene glycol (MIRALAX / GLYCOLAX) packet 17 g  17 g Oral Daily PRN Jerrye Beavers, PA-C      . prochlorperazine (COMPAZINE) injection 5-10 mg  5-10 mg Intravenous Q4H PRN Michael Boston, MD      . saccharomyces boulardii (FLORASTOR) capsule 250 mg  250 mg Oral BID Michael Boston, MD   250 mg at 04/22/16 1014  . vancomycin (VANCOCIN) IVPB 750 mg/150 ml premix  750 mg Intravenous Q12H Adrian Saran, RPH   750 mg at 04/22/16 0444  . vitamin C (ASCORBIC ACID) tablet 500 mg  500 mg Oral BID Michael Boston, MD   500 mg at 04/22/16 1015     Allergies  Allergen Reactions   . Flomax [Tamsulosin] Other (See Comments)    Dizziness, orthostasis, especially in shower  .  Ace Inhibitors Swelling    Facial swelling and small intestinal swelling.    BP (!) 88/56 (BP Location: Left Arm) Comment: RN notified  Pulse 86   Temp 97.7 F (36.5 C) (Oral)   Resp 20   Ht 5' 11"  (1.803 m)   Wt 102.1 kg (225 lb)   SpO2 97%   BMI 31.38 kg/m   Ct Abdomen Pelvis W Contrast  Result Date: 04/20/2016 CLINICAL DATA:  Recently diagnosed irritable bowel disease. Rectal bleeding for 3 days. History of rectal abscess and fistula. EXAM: CT ABDOMEN AND PELVIS WITH CONTRAST TECHNIQUE: Multidetector CT imaging of the abdomen and pelvis was performed using the standard protocol following bolus administration of intravenous contrast. CONTRAST:  156m ISOVUE-300 IOPAMIDOL (ISOVUE-300) INJECTION 61% COMPARISON:  08/16/2015 FINDINGS: Lower chest: No acute infiltrate or effusion. 1 cm calcified left posterior lower lobe nodule, presumably a granuloma. Minimal surrounding soft tissue and questionable spiculation is similar compared to prior. Hepatobiliary: Mild diffuse decreased attenuation of the liver, consistent with fatty changes. No focal abnormality. Gallbladder contains no calcified stones. No intra or extrahepatic biliary dilatation. Pancreas: Unremarkable. No pancreatic ductal dilatation or surrounding inflammatory changes. Spleen: Normal in size without focal abnormality. Adrenals/Urinary Tract: Bilateral adrenal glands are within normal limits. Sub cm cortical hypodense lesions in the kidneys too small to further characterize but are unchanged. 1 cm cyst in the midpole of the right kidney, unchanged. No hydronephrosis. Bladder contains a Foley catheter. Mild bilateral nonspecific perinephric fat stranding and edema. Stomach/Bowel: Stomach is nondilated. No dilated small bowel to suggest obstruction. Appendix is visualized and is normal. Multiple irregular perirectal and perianal mildly rim  enhancing fluid collections, right greater than left. Collection measures approximately 5.7 cm transverse by 5.7 cm in AP by 7 cm cranial caudad. Moderate surrounding edema and inflammation in the gluteal fat. Extension of fluid and edema to the skin surface, consistent with fistula. Mild edema and soft tissue stranding in the presacral space. Vascular/Lymphatic: Tiny perirectal lymph nodes. Stable left para-aortic 8 mm lymph node at the level of the SMA. Atherosclerotic vascular disease of the aorta. No aneurysm. Reproductive: Prostate gland unremarkable. Other: No free air.  Small fatty containing umbilical hernia. Musculoskeletal: No acute osseous abnormality. Mild retrolisthesis of L1 on L2, L2 on L3, L3 on L4 and L4 on L5. Associated disc space narrowing and vacuum discs. Canal stenosis of the lower lumbar spine. IMPRESSION: 1. Moderate edema and soft tissue stranding in the perirectal space with irregular, mildly rim enhancing perirectal and perianal fluid collection, consistent with abscess. Extension of fluid and soft tissue changes to the skin surface suspicious for fistula. 2. Fatty infiltration of the liver. 3. Mild bilateral nonspecific perinephric fat stranding. 4. Small intrapelvic lymph nodes, likely reactive. 5. Calcified left lower lobe nodule with minimal surrounding soft tissue, favor granuloma. Electronically Signed   By: KDonavan FoilM.D.   On: 04/20/2016 14:54    Note: This dictation was prepared with Dragon/digital dictation along with [employer] Any transcriptional errors that result from this process are unintentional.

## 2016-04-22 NOTE — Progress Notes (Signed)
Patient in restroom with nurse tech taking a wash up at sink. Patient complained of dizziness and states he feels like he is going to pass out. Writer and tech assisted patient to sit on bedside commode. Upon assessment patient diaphoretic, pupils dilated, vital signs as follows BP 140/124 HR 119, oxygen saturation  99% on room air, and Respirations 23.  Cold compress placed on patients head. When dizziness resolved assisted patient back to bed with assist x 2. VS retaken BP 88/56 manually, HR 86, oxygen level 97% on RA and Respirations 20. Notified Dr. Johney Maine of situation and new orders received.

## 2016-04-23 LAB — HEPATITIS B CORE ANTIBODY, TOTAL: HEP B C TOTAL AB: NEGATIVE

## 2016-04-23 LAB — HEPATITIS B SURFACE ANTIGEN: HEP B S AG: NEGATIVE

## 2016-04-23 MED ORDER — HYDROGEN PEROXIDE 3 % EX SOLN
CUTANEOUS | Status: AC
  Filled 2016-04-23: qty 473

## 2016-04-23 NOTE — Progress Notes (Signed)
Pharmacy Antibiotic Note  Jesse English is a 55 y.o. male admitted on 04/20/2016 with perirectal abscess/fistula.  Pharmacy has been consulted for Vanc/Zosyn dosing.  Plan: Vancomycin d/c 10/14 by CCS D4 Zosyn 3.375gm q8hr - 4 hr infusion  Height: 5\' 11"  (180.3 cm) Weight: 225 lb (102.1 kg) IBW/kg (Calculated) : 75.3  Temp (24hrs), Avg:97.8 F (36.6 C), Min:97.6 F (36.4 C), Max:98 F (36.7 C)  Antimicrobials this admission:  10/12 Zosyn >>  10/12 Vanc >> 10/14  Microbiology results:  10/12 BCx: ngtd 10/12 Cdiff PCR: neg 10/13 AbscessCx: few GPC, few E coli, Proteus   Recent Labs Lab 04/20/16 1054 04/20/16 1113 04/20/16 1114 04/20/16 1404 04/21/16 0425  WBC 13.8*  --   --   --  12.0*  CREATININE 1.15 1.20  --   --   --   LATICACIDVEN  --   --  1.18 1.30  --     Estimated Creatinine Clearance: 82.6 mL/min (by C-G formula based on SCr of 1.2 mg/dL).    Allergies  Allergen Reactions  . Flomax [Tamsulosin] Other (See Comments)    Dizziness, orthostasis, especially in shower  . Ace Inhibitors Swelling    Facial swelling and small intestinal swelling.   Thank you for allowing pharmacy to be a part of this patient's care.  Minda Ditto PharmD Pager [phone] 04/23/2016, 12:34 PM

## 2016-04-23 NOTE — Progress Notes (Signed)
2 Days Post-Op  Subjective: Feels better today. Got lightheaded in shower yesterday  Objective: Vital signs in last 24 hours: Temp:  [97.6 F (36.4 C)-98 F (36.7 C)] 98 F (36.7 C) (10/15 0608) Pulse Rate:  [69-119] 76 (10/15 0608) Resp:  [16-23] 16 (10/15 0608) BP: (88-140)/(56-124) 116/78 (10/15 0608) SpO2:  [97 %-100 %] 99 % (10/15 0608) Last BM Date: 04/22/16  Intake/Output from previous day: 10/14 0701 - 10/15 0700 In: 1480 [P.O.:1080; IV Piggyback:400] Out: 2725 [Urine:2725] Intake/Output this shift: No intake/output data recorded.  Resp: clear to auscultation bilaterally Cardio: regular rate and rhythm GI: soft, nontender. rectal wound relatively clean  Lab Results:   Recent Labs  04/20/16 1054 04/20/16 1113 04/21/16 0425  WBC 13.8*  --  12.0*  HGB 14.2 15.3 13.0  HCT 41.6 45.0 38.8*  PLT 341  --  355   BMET  Recent Labs  04/20/16 1054 04/20/16 1113  NA 133* 136  K 3.7 3.7  CL 102 101  CO2 22  --   GLUCOSE 106* 104*  BUN 22* 20  CREATININE 1.15 1.20  CALCIUM 8.4*  --    PT/INR  Recent Labs  04/20/16 1054  LABPROT 15.9*  INR 1.26   ABG No results for input(s): PHART, HCO3 in the last 72 hours.  Invalid input(s): PCO2, PO2  Studies/Results: No results found.  Anti-infectives: Anti-infectives    Start     Dose/Rate Route Frequency Ordered Stop   04/21/16 0400  vancomycin (VANCOCIN) IVPB 750 mg/150 ml premix     750 mg 150 mL/hr over 60 Minutes Intravenous Every 12 hours 04/20/16 1530     04/21/16 0000  piperacillin-tazobactam (ZOSYN) IVPB 3.375 g     3.375 g 12.5 mL/hr over 240 Minutes Intravenous Every 8 hours 04/20/16 1530     04/20/16 1545  vancomycin (VANCOCIN) 2,000 mg in sodium chloride 0.9 % 500 mL IVPB     2,000 mg 250 mL/hr over 120 Minutes Intravenous  Once 04/20/16 1530 04/20/16 1836   04/20/16 1545  piperacillin-tazobactam (ZOSYN) IVPB 3.375 g     3.375 g 100 mL/hr over 30 Minutes Intravenous  Once 04/20/16 1530  04/20/16 1807      Assessment/Plan: s/p Procedure(s): INCISION AND DRAINAGE OF HORSESHOE  FOURNIER GANGRENE PERIRECTAL ABSCESS (N/A) HEMORRHOIDECTOMY Continue vanc and zosyn for perirectal abscess  Continue dressing changes Urinary retention. Will d/c foley tomorrow. flomax stopped for lightheadedness Will need home health at discharge  LOS: 2 days    TOTH III,PAUL S 04/23/2016

## 2016-04-23 NOTE — Care Management Note (Signed)
Case Management Note  Patient Details  Name: Jesse English MRN: [id number] Date of Birth: October 05, 1960  Subjective/Objective:  I&D of horseshoe fournier gangrene perirectal abscess, hemorrhoidectomy                 Action/Plan: Discharge Planning: NCM spoke to pt at bedside. Offered choice for HH/list provided. Pt requested [facility]. States wife will be at home to assist with dressing changes. Contacted [facility] Liaison with new referral. Unit RN will supply some supplies for pt to take home as [facility] will supply once the start care. Message to attending to include instructions to [facility] RN orders.    Expected Discharge Date:                Expected Discharge Plan:  [place]  In-House Referral:  NA  Discharge planning Services  CM Consult  Post Acute Care Choice:  Home Health Choice offered to:  Patient  DME Arranged:  N/A DME Agency:  NA  HH Arranged:  RN [place] Agency:  [place]  Status of Service:  In process, will continue to follow  If discussed at Long Length of Stay Meetings, dates discussed:    Additional Comments:  Erenest Rasher, RN 04/23/2016, 10:17 AM

## 2016-04-23 NOTE — Procedures (Signed)
Pt is on home cpap with home settings. 

## 2016-04-24 ENCOUNTER — Telehealth: Payer: Self-pay | Admitting: Internal Medicine

## 2016-04-24 MED ORDER — CIPROFLOXACIN HCL 500 MG PO TABS: 500.0000 mg | ORAL_TABLET | Freq: Two times a day (BID) | ORAL | 0 refills | Status: AC

## 2016-04-24 NOTE — Progress Notes (Signed)
Patient ID: Jesse English, male   DOB: 1961-04-28, 55 y.o.   MRN: [id number]  [facility] Surgery Progress Note  3 Days Post-Op  Subjective: Asking when he will be able to go home. Pain well controlled. Tolerating diet and having regular loose BM's.  Objective: Vital signs in last 24 hours: Temp:  [97.7 F (36.5 C)-98.6 F (37 C)] 98.2 F (36.8 C) (10/16 1359) Pulse Rate:  [72-83] 77 (10/16 1359) Resp:  [16-18] 16 (10/16 1359) BP: (118-136)/(68-77) 118/68 (10/16 1359) SpO2:  [99 %] 99 % (10/16 1359) Last BM Date: 04/23/16  Intake/Output from previous day: 10/15 0701 - 10/16 0700 In: 1880 [P.O.:1680; IV Piggyback:200] Out: 1725 [Urine:1725] Intake/Output this shift: Total I/O In: 720 [P.O.:720] Out: 800 [Urine:800]  PE: Gen:  Alert, NAD, pleasant Abd: Soft, NT/ND, +BS Rectum: penrose drain in place, wound with clean edges and no purulent drainage  Lab Results:  No results for input(s): WBC, HGB, HCT, PLT in the last 72 hours. BMET No results for input(s): NA, K, CL, CO2, GLUCOSE, BUN, CREATININE, CALCIUM in the last 72 hours. PT/INR No results for input(s): LABPROT, INR in the last 72 hours. CMP     Component Value Date/Time   NA 136 04/20/2016 1113   K 3.7 04/20/2016 1113   CL 101 04/20/2016 1113   CO2 22 04/20/2016 1054   GLUCOSE 104 (H) 04/20/2016 1113   BUN 20 04/20/2016 1113   CREATININE 1.20 04/20/2016 1113   CALCIUM 8.4 (L) 04/20/2016 1054   PROT 7.5 04/20/2016 1054   ALBUMIN 2.8 (L) 04/20/2016 1054   AST 30 04/20/2016 1054   ALT 45 04/20/2016 1054   ALKPHOS 44 04/20/2016 1054   BILITOT 1.1 04/20/2016 1054   GFRNONAA >60 04/20/2016 1054   GFRAA >60 04/20/2016 1054   Lipase     Component Value Date/Time   LIPASE 20 04/20/2016 1054       Studies/Results: No results found.  Anti-infectives: Anti-infectives    Start     Dose/Rate Route Frequency Ordered Stop   04/21/16 0400  vancomycin (VANCOCIN) IVPB 750 mg/150 ml premix  Status:   Discontinued     750 mg 150 mL/hr over 60 Minutes Intravenous Every 12 hours 04/20/16 1530 04/23/16 1231   04/21/16 0000  piperacillin-tazobactam (ZOSYN) IVPB 3.375 g     3.375 g 12.5 mL/hr over 240 Minutes Intravenous Every 8 hours 04/20/16 1530     04/20/16 1545  vancomycin (VANCOCIN) 2,000 mg in sodium chloride 0.9 % 500 mL IVPB     2,000 mg 250 mL/hr over 120 Minutes Intravenous  Once 04/20/16 1530 04/20/16 1836   04/20/16 1545  piperacillin-tazobactam (ZOSYN) IVPB 3.375 g     3.375 g 100 mL/hr over 30 Minutes Intravenous  Once 04/20/16 1530 04/20/16 1807       Assessment/Plan s/p Procedure(s): INCISION AND DRAINAGE OF HORSESHOE  FOURNIER GANGRENE PERIRECTAL ABSCESS (N/A) HEMORRHOIDECTOMY 04/21/16 Dr. Johney Maine - POD 3 - pain controlled, tolerating diet, bowel function returned  Plan - patient ready for discharge home. He will be sent home on 10 days of cipro. HH is arranged to help with daily wet to dry dressing changes. He will follow-up with Dr. Johney Maine in 7-10 days. He knows to make close follow-up with GI as well.   LOS: 3 days    Jerrye Beavers , [facility] 04/24/2016, 2:02 PM Pager: [phone] Consults: [phone] Mon-Fri 7:00 am-4:30 pm Sat-Sun 7:00 am-11:30 am

## 2016-04-24 NOTE — Telephone Encounter (Signed)
noted 

## 2016-04-24 NOTE — Progress Notes (Signed)
Dressing change instruction done with wife.   Bethann Punches RN

## 2016-04-24 NOTE — Care Management (Signed)
We are signing off. I have arranged for outpatient follow up in our clinic, [facility] with Dr. Silverio Decamp on NOV 9th at 10:30-this is is in pt discharge instructions. Please let us know if we may be of any further assistance at time of this hospital stay. Thank you-JLL

## 2016-04-25 LAB — CULTURE, BLOOD (ROUTINE X 2)
Culture: NO GROWTH
Culture: NO GROWTH

## 2016-04-25 LAB — QUANTIFERON IN TUBE
QFT TB AG MINUS NIL VALUE: 0 [IU]/mL
QUANTIFERON MITOGEN VALUE: 0.06 [IU]/mL
QUANTIFERON TB AG VALUE: 0.02 IU/mL
QUANTIFERON TB GOLD: UNDETERMINED
Quantiferon Nil Value: 0.02 IU/mL

## 2016-04-25 LAB — AEROBIC CULTURE W GRAM STAIN (SUPERFICIAL SPECIMEN)

## 2016-04-25 LAB — AEROBIC CULTURE  (SUPERFICIAL SPECIMEN)

## 2016-04-25 LAB — QUANTIFERON TB GOLD ASSAY (BLOOD)

## 2016-04-25 NOTE — Telephone Encounter (Signed)
Patient was updated yesterday inpatient

## 2016-04-26 LAB — ANAEROBIC CULTURE

## 2016-04-26 NOTE — Discharge Summary (Signed)
[facility] Discharge Summary   Patient ID: Jesse English MRN: [id number] DOB/AGE: [birth year] 57 y.o.  Admit date: 04/20/2016 Discharge date: 04/26/2016  Admitting Diagnosis: Perirectal abscess  Discharge Diagnosis Patient Active Problem List   Diagnosis Date Noted  . Acute urinary retention s/p Foley 04/15/2016 04/22/2016  . Fournier's gangrene in male s/p I&D 04/21/2016 04/21/2016  . Ulcerative colitis, chronic, with rectal bleeding  12/22/2015    Consultants None  Imaging: CT abdomen pelvis 04/20/16: 1. Moderate edema and soft tissue stranding in the perirectal space with irregular, mildly rim enhancing perirectal and perianal fluid collection, consistent with abscess. Extension of fluid and soft tissue changes to the skin surface suspicious for fistula. 2. Fatty infiltration of the liver.  3. Mild bilateral nonspecific perinephric fat stranding. 4. Small intrapelvic lymph nodes, likely reactive. 5. Calcified left lower lobe nodule with minimal surrounding soft tissue, favor granuloma.  Procedures Dr. Johney Maine (04/21/16) - INCISION AND DRAINAGE OF HORSESHOE FOURNIER GANGRENE [place] [facility] Course:  Jesse English is a 55yo male PMH IBD who presented to [facility] 04/20/16 with 3 days of rectal pain and what he thought was rectal bleeding.  Workup showed perirectal abscess with a fistula.  Patient was admitted and underwent procedure listed above.  Tolerated procedure well and was transferred to the floor.  Diet was advanced as tolerated.  Wound was redressed with wet to dry dressings once daily and gradually improved. He was initially started on vanc and zosyn, and this was transitioned to cipro upon discharge. On POD3 the patient tolerating diet, ambulating well, pain well controlled, vital signs stable, incisions c/d/i and felt stable for discharge home.  Home health was consulted to help with wound care. Patient was discharged on 10  days of cipro. He will follow-up with Dr. Johney Maine in 7-10 days. He knows to make close follow-up with GI as well.  Physical Exam: Gen:  Alert, NAD, pleasant Abd: Soft, NT/ND, +BS Rectum: penrose drain in place, wound with clean edges and no purulent drainage    Medication List    STOP taking these medications   doxycycline 100 MG capsule Commonly known as:  VIBRAMYCIN   metroNIDAZOLE 500 MG tablet Commonly known as:  FLAGYL     TAKE these medications   amLODipine 5 MG tablet Commonly known as:  NORVASC Take 5 mg by mouth daily.   ciprofloxacin 500 MG tablet Commonly known as:  CIPRO Take 1 tablet (500 mg total) by mouth 2 (two) times daily.   LIALDA 1.2 g EC tablet Generic drug:  mesalamine Take 2.4 g by mouth 2 (two) times daily.   mesalamine 500 MG CR capsule Commonly known as:  PENTASA Take 2 capsules (1,000 mg total) by mouth 4 (four) times daily.   ROWASA 4 g Kit Place 1 kit (4 g total) rectally at bedtime.   VSL#3 Caps Take 1 capsule by mouth daily. Begin after 2nd week of Vancomycin        Follow-up [facility] .   Why:  Home Health RN Contact information: [address] [place] [address] [phone]        Harl Bowie, MD. Go on 05/18/2016.   Specialty:  Gastroenterology Why:  Please arrive at 10:15 for your 10:30 appt, if this does not work with your schedule or you feel you need to be seen sooner, please call our clinic. Thank you. Contact information: [place] [address]  [phone]        Adin Hector., MD. Call today.   Specialty:  General Surgery Why:  7-10 days Contact information: [place] [address] [phone]           Signed: Jerrye Beavers, [facility] 04/26/2016, 2:24 PM Pager: [phone] Consults: [phone] Mon-Fri 7:00 am-4:30 pm Sat-Sun 7:00 am-11:30 am

## 2016-04-26 NOTE — Progress Notes (Signed)
I ordered this when he was in with peri-rectal abscess thining he may need biologics Should also have Hep B S Ag result in chart

## 2016-04-27 ENCOUNTER — Telehealth (HOSPITAL_BASED_OUTPATIENT_CLINIC_OR_DEPARTMENT_OTHER): Payer: Self-pay | Admitting: Emergency Medicine

## 2016-04-27 ENCOUNTER — Telehealth: Payer: Self-pay | Admitting: Gastroenterology

## 2016-04-28 ENCOUNTER — Other Ambulatory Visit: Payer: Self-pay

## 2016-04-28 DIAGNOSIS — K51918 Ulcerative colitis, unspecified with other complication: Secondary | ICD-10-CM

## 2016-04-28 MED ORDER — METRONIDAZOLE 500 MG PO TABS: 500.0000 mg | ORAL_TABLET | Freq: Two times a day (BID) | ORAL | 0 refills | Status: AC

## 2016-04-28 NOTE — Telephone Encounter (Signed)
Spoke with the patient. He reports he is "on the mend" and grateful we had instructed him go to the ER. On Cipro at home. Has an open wound with packing dressing changes 3 times a day. He will follow up with surgeon 05/01/16 and urology 05/03/16. He has learned through conversation with relatives, he had an aunt with Crohn's colitis.

## 2016-04-28 NOTE — Telephone Encounter (Signed)
He will need to be on flagyl 500mg  BID along with Cipro for better wound healing and will discuss starting biologics at office visit. I want make sure the wound is well drained before starting biologics. Thanks

## 2016-04-28 NOTE — Telephone Encounter (Signed)
Patient agrees to this plan of care.  

## 2016-05-03 ENCOUNTER — Other Ambulatory Visit: Payer: 59

## 2016-05-03 DIAGNOSIS — K51918 Ulcerative colitis, unspecified with other complication: Secondary | ICD-10-CM

## 2016-05-06 LAB — QUANTIFERON IN TUBE
QFT TB AG MINUS NIL VALUE: 0 [IU]/mL
QUANTIFERON MITOGEN VALUE: 0.48 [IU]/mL
QUANTIFERON NIL VALUE: 0.03 [IU]/mL
QUANTIFERON TB AG VALUE: 0.03 IU/mL
QUANTIFERON TB GOLD: UNDETERMINED — AB

## 2016-05-06 LAB — QUANTIFERON TB GOLD ASSAY (BLOOD)

## 2016-05-09 ENCOUNTER — Other Ambulatory Visit: Payer: Self-pay

## 2016-05-09 DIAGNOSIS — Z111 Encounter for screening for respiratory tuberculosis: Secondary | ICD-10-CM

## 2016-05-12 ENCOUNTER — Ambulatory Visit (INDEPENDENT_AMBULATORY_CARE_PROVIDER_SITE_OTHER): Payer: 59 | Admitting: Gastroenterology

## 2016-05-12 DIAGNOSIS — K5100 Ulcerative (chronic) pancolitis without complications: Secondary | ICD-10-CM | POA: Diagnosis not present

## 2016-05-15 ENCOUNTER — Ambulatory Visit: Payer: 59 | Admitting: Gastroenterology

## 2016-05-15 DIAGNOSIS — K51018 Ulcerative (chronic) pancolitis with other complication: Secondary | ICD-10-CM

## 2016-05-15 LAB — TB SKIN TEST
Induration: 0 mm
TB SKIN TEST: NEGATIVE

## 2016-05-18 ENCOUNTER — Encounter (INDEPENDENT_AMBULATORY_CARE_PROVIDER_SITE_OTHER): Payer: Self-pay

## 2016-05-18 ENCOUNTER — Other Ambulatory Visit (INDEPENDENT_AMBULATORY_CARE_PROVIDER_SITE_OTHER): Payer: 59

## 2016-05-18 ENCOUNTER — Encounter: Payer: Self-pay | Admitting: Gastroenterology

## 2016-05-18 ENCOUNTER — Other Ambulatory Visit: Payer: Self-pay

## 2016-05-18 ENCOUNTER — Ambulatory Visit (INDEPENDENT_AMBULATORY_CARE_PROVIDER_SITE_OTHER): Payer: 59 | Admitting: Gastroenterology

## 2016-05-18 VITALS — BP 108/78 | Ht 71.0 in | Wt 215.2 lb

## 2016-05-18 DIAGNOSIS — K603 Anal fistula: Secondary | ICD-10-CM | POA: Diagnosis not present

## 2016-05-18 DIAGNOSIS — K50114 Crohn's disease of large intestine with abscess: Secondary | ICD-10-CM | POA: Diagnosis not present

## 2016-05-18 DIAGNOSIS — K6289 Other specified diseases of anus and rectum: Secondary | ICD-10-CM | POA: Diagnosis not present

## 2016-05-18 DIAGNOSIS — K611 Rectal abscess: Secondary | ICD-10-CM | POA: Diagnosis not present

## 2016-05-18 DIAGNOSIS — K51211 Ulcerative (chronic) proctitis with rectal bleeding: Secondary | ICD-10-CM

## 2016-05-18 DIAGNOSIS — Z23 Encounter for immunization: Secondary | ICD-10-CM

## 2016-05-18 LAB — CBC WITH DIFFERENTIAL/PLATELET
BASOS ABS: 0 10*3/uL (ref 0.0–0.1)
Basophils Relative: 0.2 % (ref 0.0–3.0)
Eosinophils Absolute: 0.1 10*3/uL (ref 0.0–0.7)
Eosinophils Relative: 1.3 % (ref 0.0–5.0)
HEMATOCRIT: 33.1 % — AB (ref 39.0–52.0)
Hemoglobin: 11 g/dL — ABNORMAL LOW (ref 13.0–17.0)
LYMPHS ABS: 1.3 10*3/uL (ref 0.7–4.0)
LYMPHS PCT: 16 % (ref 12.0–46.0)
MCHC: 33.3 g/dL (ref 30.0–36.0)
MCV: 89.3 fl (ref 78.0–100.0)
MONOS PCT: 11 % (ref 3.0–12.0)
Monocytes Absolute: 0.9 10*3/uL (ref 0.1–1.0)
NEUTROS PCT: 71.5 % (ref 43.0–77.0)
Neutro Abs: 6 10*3/uL (ref 1.4–7.7)
Platelets: 281 10*3/uL (ref 150.0–400.0)
RBC: 3.71 Mil/uL — AB (ref 4.22–5.81)
RDW: 13.8 % (ref 11.5–15.5)
WBC: 8.4 10*3/uL (ref 4.0–10.5)

## 2016-05-18 LAB — COMPREHENSIVE METABOLIC PANEL
ALK PHOS: 39 U/L (ref 39–117)
ALT: 47 U/L (ref 0–53)
AST: 23 U/L (ref 0–37)
Albumin: 3.2 g/dL — ABNORMAL LOW (ref 3.5–5.2)
BILIRUBIN TOTAL: 0.6 mg/dL (ref 0.2–1.2)
BUN: 14 mg/dL (ref 6–23)
CO2: 26 mEq/L (ref 19–32)
CREATININE: 1.05 mg/dL (ref 0.40–1.50)
Calcium: 9.1 mg/dL (ref 8.4–10.5)
Chloride: 103 mEq/L (ref 96–112)
GFR: 77.15 mL/min (ref >60.00)
GLUCOSE: 104 mg/dL — AB (ref 70–99)
Potassium: 4.2 mEq/L (ref 3.5–5.1)
Sodium: 137 mEq/L (ref 135–145)
TOTAL PROTEIN: 7.3 g/dL (ref 6.0–8.3)

## 2016-05-18 LAB — HIGH SENSITIVITY CRP: CRP, High Sensitivity: 57.56 mg/L — ABNORMAL HIGH (ref 0.000–5.000)

## 2016-05-18 MED ORDER — TINIDAZOLE 500 MG PO TABS: 500.0000 mg | ORAL_TABLET | Freq: Every day | ORAL | 0 refills | Status: DC

## 2016-05-18 NOTE — Patient Instructions (Signed)
You have been scheduled for an MRI at [facility] on 05/19/2016. Your appointment time is 9pm. Please arrive 30 minutes prior to your appointment time for registration purposes. Please make certain not to have anything to eat or drink 6 hours prior to your test. In addition, if you have any metal in your body, have a pacemaker or defibrillator, please be sure to let your ordering physician know. This test typically takes 45 minutes to 1 hour to complete.   We will send in your prescription to your pharmacy  Go to the basement for labs today

## 2016-05-18 NOTE — Progress Notes (Addendum)
         Jesse English    [id number]    01/05/1961  Primary Care Physician:Shaw, William, MD  Referring Physician: William Shaw, MD [address] [place], [place] [address]  Chief complaint: Colitis with fistula and peri rectal abscess HPI: 55-year-old male with new diagnosis of left-sided colitis in March 2017 presumed to be ulcerative colitis in the setting of C. difficile colitis and subsequent flare in the setting of recurrent c.diff colitis in April 2017 and July 2017 treated with prolonged Vancomycin taper. He was last seen in office in Sep 2017 is here for follow up visit, accompanied by his wife. He developed an abscess last month that was drained subsequent CT abd & pelvis showed persistent peri rectal abscess and was noted to have Fournier gangrene requiring drainage. He was given Cipro for 1 week that he completed currently not on antibiotics. He strated going to work 2 days ago and reports worsening rectal pain. He is continuous leakage and drainage from the rectum. About 2-3 semi formed bowel movement daily. No fever or chills.  Outpatient Encounter Prescriptions as of 05/18/2016  Medication Sig  . amLODipine (NORVASC) 5 MG tablet Take 5 mg by mouth daily. Pt takes 2.5mg daily  . LIALDA 1.2 g EC tablet Take 2.4 g by mouth 2 (two) times daily.   . mesalamine (PENTASA) 500 MG CR capsule Take 2 capsules (1,000 mg total) by mouth 4 (four) times daily. (Patient taking differently: Take 1,000 mg by mouth daily. )  . Probiotic Product (VSL#3) CAPS Take 1 capsule by mouth daily. Begin after 2nd week of Vancomycin  . [DISCONTINUED] Mesalamine-Cleanser (ROWASA) 4 g KIT Place 1 kit (4 g total) rectally at bedtime.   No facility-administered encounter medications on file as of 05/18/2016.     Allergies as of 05/18/2016 - Review Complete 05/18/2016  Allergen Reaction Noted  . Flomax [tamsulosin] Other (See Comments) 04/22/2016  . Ace inhibitors Swelling 09/23/2015    Past Medical  History:  Diagnosis Date  . Colon polyps    2013  . Crohn's colitis (HCC)    vs UC with abscess  . Diverticulosis   . Fournier's gangrene in male 04/2016  . Gout   . Hypertension   . Perirectal abscess 04/2016  . Prolapsed internal hemorrhoid, grade 4   . Sleep apnea    cpap    Past Surgical History:  Procedure Laterality Date  . ANAL FISSURE REPAIR    . COLONOSCOPY    . HEMORRHOID SURGERY  04/21/2016   Procedure: HEMORRHOIDECTOMY;  Surgeon: Steven Gross, MD;  Location: [facility];  Service: General;;  . INCISION AND DRAINAGE ABSCESS N/A 04/21/2016   Procedure: INCISION AND DRAINAGE OF HORSESHOE  FOURNIER GANGRENE PERIRECTAL ABSCESS;  Surgeon: Steven Gross, MD;  Location: [facility];  Service: General;  Laterality: N/A;    Family History  Problem Relation Age of Onset  . Colon cancer Neg Hx   . Esophageal cancer Neg Hx   . Stomach cancer Neg Hx   . Rectal cancer Neg Hx     Social History   Social History  . Marital status: Single    Spouse name: N/A  . Number of children: N/A  . Years of education: N/A   Occupational History  . Not on file.   Social History Main Topics  . Smoking status: Former Smoker    Types: Cigars    Quit date: 07/10/2013  . Smokeless tobacco: Never Used  . Alcohol use   No     Comment: 2 per day  . Drug use: No  . Sexual activity: Not on file   Other Topics Concern  . Not on file   Social History Narrative  . No narrative on file      Review of systems: Review of Systems  Constitutional: Negative for fever and chills.  HENT: Negative.   Eyes: Negative for blurred vision.  Respiratory: Negative for cough, shortness of breath and wheezing.   Cardiovascular: Negative for chest pain and palpitations.  Gastrointestinal: as per HPI Genitourinary: Negative for dysuria, urgency, frequency and hematuria.  Musculoskeletal: Negative for myalgias, back pain and joint pain.  Skin: Negative for itching and rash.  Neurological: Negative for  dizziness, tremors, focal weakness, seizures and loss of consciousness.  Endo/Heme/Allergies: Positive for seasonal allergies.  Psychiatric/Behavioral: Negative for depression, suicidal ideas and hallucinations.  All other systems reviewed and are negative.   Physical Exam: Vitals:   05/18/16 1045  BP: 108/78   Body mass index is 30.02 kg/m. Gen:      No acute distress HEENT:  EOMI, sclera anicteric Neck:     No masses; no thyromegaly Lungs:    Clear to auscultation bilaterally; normal respiratory effort CV:         Regular rate and rhythm; no murmurs Abd:      + bowel sounds; soft, non-tender; no palpable masses, no distension Ext:    No edema; adequate peripheral perfusion Skin:      Warm and dry; no rash Neuro: alert and oriented x 3 Psych: normal mood and affect Rectal exam: perirectal induration with drainage, seton appears to be in place, no palpable mass  Data Reviewed:  Reviewed labs, radiology imaging, old records and pertinent past GI work up   Assessment and Plan/Recommendations:  24 yr M with left sided colitis diagnosed in March 2902, now complicated with perirectal absecess and fistula.  Discussed in detail the likelyhood of this being Crohn's colitis with fistula and need to start biologics Discussed in detail the potential side effects and benefits with Biologics PPD negative Given Flu and Pneumo Vac 23 during this visit He will need Hep A & B vaccination TB Quantiferon was indeterminate, PPD negative Plan to start Entyvio as soon as approved by insurance within next few days, nexte drug of choice will be Remicade, if entyvio is not approved by [employer] Antibiotics given persistent fistula (he didn't tolerate flagyl well), will start Tinidazole 531m daily, will likely need to stay on it till the fistula heels Check C.diff Plevic MRI to see if there is any residual abscess  25 minutes was spent face-to-face with the patient. Greater than 50% of the  time used for counseling as well as treatment plan and follow-up. Patient and his wife had multiple questions which were answered to their satisfaction  K. VDenzil Magnuson, MD [phone]-Fri 8a-5p [phone] 5p, weekends, holidays  CC: SMarton Redwood MD

## 2016-05-18 NOTE — Progress Notes (Signed)
Benefits investigation for Entyvio infusion

## 2016-05-19 ENCOUNTER — Other Ambulatory Visit: Payer: 59

## 2016-05-19 ENCOUNTER — Ambulatory Visit (HOSPITAL_COMMUNITY)
Admission: RE | Admit: 2016-05-19 | Discharge: 2016-05-19 | Disposition: A | Discharge status: Home / Self Care | Payer: 59 | Source: Ambulatory Visit | Attending: Gastroenterology | Admitting: Gastroenterology

## 2016-05-19 DIAGNOSIS — K50114 Crohn's disease of large intestine with abscess: Secondary | ICD-10-CM

## 2016-05-19 DIAGNOSIS — K611 Rectal abscess: Secondary | ICD-10-CM | POA: Insufficient documentation

## 2016-05-19 DIAGNOSIS — K603 Anal fistula: Secondary | ICD-10-CM

## 2016-05-19 DIAGNOSIS — I2602 Saddle embolus of pulmonary artery with acute cor pulmonale: Secondary | ICD-10-CM | POA: Diagnosis not present

## 2016-05-19 DIAGNOSIS — N400 Enlarged prostate without lower urinary tract symptoms: Secondary | ICD-10-CM | POA: Insufficient documentation

## 2016-05-19 DIAGNOSIS — K6289 Other specified diseases of anus and rectum: Secondary | ICD-10-CM | POA: Insufficient documentation

## 2016-05-19 DIAGNOSIS — R0602 Shortness of breath: Secondary | ICD-10-CM | POA: Diagnosis not present

## 2016-05-20 ENCOUNTER — Emergency Department (HOSPITAL_COMMUNITY): Payer: 59

## 2016-05-20 ENCOUNTER — Inpatient Hospital Stay (HOSPITAL_COMMUNITY)
Admission: EM | Admit: 2016-05-20 | Discharge: 2016-05-24 | DRG: 175 | Disposition: A | Discharge status: Home / Self Care | Payer: 59 | Attending: Internal Medicine | Admitting: Internal Medicine

## 2016-05-20 ENCOUNTER — Encounter (HOSPITAL_COMMUNITY): Payer: Self-pay | Admitting: Emergency Medicine

## 2016-05-20 DIAGNOSIS — K5010 Crohn's disease of large intestine without complications: Secondary | ICD-10-CM | POA: Diagnosis present

## 2016-05-20 DIAGNOSIS — I2699 Other pulmonary embolism without acute cor pulmonale: Secondary | ICD-10-CM | POA: Diagnosis not present

## 2016-05-20 DIAGNOSIS — I82432 Acute embolism and thrombosis of left popliteal vein: Secondary | ICD-10-CM | POA: Diagnosis present

## 2016-05-20 DIAGNOSIS — Z87891 Personal history of nicotine dependence: Secondary | ICD-10-CM

## 2016-05-20 DIAGNOSIS — I2602 Saddle embolus of pulmonary artery with acute cor pulmonale: Secondary | ICD-10-CM | POA: Diagnosis present

## 2016-05-20 DIAGNOSIS — I2601 Septic pulmonary embolism with acute cor pulmonale: Secondary | ICD-10-CM | POA: Diagnosis not present

## 2016-05-20 DIAGNOSIS — I10 Essential (primary) hypertension: Secondary | ICD-10-CM | POA: Diagnosis present

## 2016-05-20 DIAGNOSIS — Z8619 Personal history of other infectious and parasitic diseases: Secondary | ICD-10-CM

## 2016-05-20 DIAGNOSIS — I82411 Acute embolism and thrombosis of right femoral vein: Secondary | ICD-10-CM | POA: Diagnosis present

## 2016-05-20 DIAGNOSIS — M79609 Pain in unspecified limb: Secondary | ICD-10-CM | POA: Diagnosis not present

## 2016-05-20 DIAGNOSIS — R339 Retention of urine, unspecified: Secondary | ICD-10-CM | POA: Diagnosis present

## 2016-05-20 DIAGNOSIS — Z888 Allergy status to other drugs, medicaments and biological substances status: Secondary | ICD-10-CM | POA: Diagnosis not present

## 2016-05-20 DIAGNOSIS — K50011 Crohn's disease of small intestine with rectal bleeding: Secondary | ICD-10-CM | POA: Diagnosis not present

## 2016-05-20 DIAGNOSIS — K604 Rectal fistula, unspecified: Secondary | ICD-10-CM | POA: Diagnosis present

## 2016-05-20 DIAGNOSIS — K5090 Crohn's disease, unspecified, without complications: Secondary | ICD-10-CM | POA: Diagnosis present

## 2016-05-20 DIAGNOSIS — R0602 Shortness of breath: Secondary | ICD-10-CM | POA: Diagnosis present

## 2016-05-20 LAB — CBC WITH DIFFERENTIAL/PLATELET
BASOS ABS: 0 10*3/uL (ref 0.0–0.1)
Basophils Relative: 0 %
Eosinophils Absolute: 0.1 10*3/uL (ref 0.0–0.7)
Eosinophils Relative: 1 %
HCT: 36.6 % — ABNORMAL LOW (ref 39.0–52.0)
HEMOGLOBIN: 12 g/dL — AB (ref 13.0–17.0)
LYMPHS ABS: 1.3 10*3/uL (ref 0.7–4.0)
LYMPHS PCT: 11 %
MCH: 29.6 pg (ref 26.0–34.0)
MCHC: 32.8 g/dL (ref 30.0–36.0)
MCV: 90.1 fL (ref 78.0–100.0)
Monocytes Absolute: 1.2 10*3/uL — ABNORMAL HIGH (ref 0.1–1.0)
Monocytes Relative: 10 %
NEUTROS ABS: 9.9 10*3/uL — AB (ref 1.7–7.7)
NEUTROS PCT: 78 %
Platelets: 254 10*3/uL (ref 150–400)
RBC: 4.06 MIL/uL — AB (ref 4.22–5.81)
RDW: 13.5 % (ref 11.5–15.5)
WBC: 12.6 10*3/uL — AB (ref 4.0–10.5)

## 2016-05-20 LAB — URINALYSIS, ROUTINE W REFLEX MICROSCOPIC
Bilirubin Urine: NEGATIVE
GLUCOSE, UA: NEGATIVE mg/dL
HGB URINE DIPSTICK: NEGATIVE
Ketones, ur: 15 mg/dL — AB
LEUKOCYTES UA: NEGATIVE
Nitrite: NEGATIVE
PROTEIN: 30 mg/dL — AB
SPECIFIC GRAVITY, URINE: 1.038 — AB (ref 1.005–1.030)
pH: 6 (ref 5.0–8.0)

## 2016-05-20 LAB — COMPREHENSIVE METABOLIC PANEL
ALT: 37 U/L (ref 17–63)
ANION GAP: 13 (ref 5–15)
AST: 29 U/L (ref 15–41)
Albumin: 3.1 g/dL — ABNORMAL LOW (ref 3.5–5.0)
Alkaline Phosphatase: 42 U/L (ref 38–126)
BILIRUBIN TOTAL: 0.7 mg/dL (ref 0.3–1.2)
BUN: 11 mg/dL (ref 6–20)
CHLORIDE: 104 mmol/L (ref 101–111)
CO2: 20 mmol/L — ABNORMAL LOW (ref 22–32)
Calcium: 8.9 mg/dL (ref 8.9–10.3)
Creatinine, Ser: 1.33 mg/dL — ABNORMAL HIGH (ref 0.61–1.24)
GFR, EST NON AFRICAN AMERICAN: 58 mL/min — AB (ref >60)
Glucose, Bld: 162 mg/dL — ABNORMAL HIGH (ref 65–99)
POTASSIUM: 3.4 mmol/L — AB (ref 3.5–5.1)
Sodium: 137 mmol/L (ref 135–145)
TOTAL PROTEIN: 7.4 g/dL (ref 6.5–8.1)

## 2016-05-20 LAB — I-STAT CHEM 8, ED
BUN: 11 mg/dL (ref 6–20)
CALCIUM ION: 1.09 mmol/L — AB (ref 1.15–1.40)
CHLORIDE: 105 mmol/L (ref 101–111)
Creatinine, Ser: 1.2 mg/dL (ref 0.61–1.24)
GLUCOSE: 158 mg/dL — AB (ref 65–99)
HCT: 38 % — ABNORMAL LOW (ref 39.0–52.0)
Hemoglobin: 12.9 g/dL — ABNORMAL LOW (ref 13.0–17.0)
Potassium: 3.4 mmol/L — ABNORMAL LOW (ref 3.5–5.1)
SODIUM: 140 mmol/L (ref 135–145)
TCO2: 21 mmol/L (ref 0–100)

## 2016-05-20 LAB — I-STAT TROPONIN, ED: TROPONIN I, POC: 0.1 ng/mL — AB (ref 0.00–0.08)

## 2016-05-20 LAB — I-STAT CG4 LACTIC ACID, ED
LACTIC ACID, VENOUS: 3.07 mmol/L — AB (ref 0.5–1.9)
Lactic Acid, Venous: 1.6 mmol/L (ref 0.5–1.9)

## 2016-05-20 LAB — ABO/RH: ABO/RH(D): O POS

## 2016-05-20 LAB — TYPE AND SCREEN
ABO/RH(D): O POS
ANTIBODY SCREEN: NEGATIVE

## 2016-05-20 LAB — URINE MICROSCOPIC-ADD ON

## 2016-05-20 LAB — CLOSTRIDIUM DIFFICILE BY PCR: CDIFFPCR: NOT DETECTED

## 2016-05-20 LAB — BRAIN NATRIURETIC PEPTIDE: B NATRIURETIC PEPTIDE 5: 269.4 pg/mL — AB (ref 0.0–100.0)

## 2016-05-20 MED ORDER — SODIUM CHLORIDE 0.9 % IV BOLUS (SEPSIS)
1000.0000 mL | Freq: Once | INTRAVENOUS | Status: AC
  Administered 2016-05-20: 1000 mL via INTRAVENOUS

## 2016-05-20 MED ORDER — ASPIRIN 300 MG RE SUPP: 300.0000 mg | RECTAL | Status: AC

## 2016-05-20 MED ORDER — LACTATED RINGERS IV SOLN
INTRAVENOUS | Status: DC
  Administered 2016-05-20 – 2016-05-21 (×2): via INTRAVENOUS

## 2016-05-20 MED ORDER — HEPARIN (PORCINE) IN NACL 100-0.45 UNIT/ML-% IJ SOLN
1650.0000 [IU]/h | INTRAMUSCULAR | Status: DC
  Administered 2016-05-20 – 2016-05-21 (×3): 1650 [IU]/h via INTRAVENOUS
  Filled 2016-05-20 (×5): qty 250

## 2016-05-20 MED ORDER — CIPROFLOXACIN HCL 500 MG PO TABS
500.0000 mg | ORAL_TABLET | Freq: Two times a day (BID) | ORAL | Status: DC
  Administered 2016-05-20 – 2016-05-24 (×8): 500 mg via ORAL
  Filled 2016-05-20 (×8): qty 1

## 2016-05-20 MED ORDER — IOPAMIDOL (ISOVUE-370) INJECTION 76%
INTRAVENOUS | Status: AC
  Administered 2016-05-20: 80 mL
  Filled 2016-05-20: qty 100

## 2016-05-20 MED ORDER — MESALAMINE 1.2 G PO TBEC
1.2000 g | DELAYED_RELEASE_TABLET | Freq: Two times a day (BID) | ORAL | Status: DC
  Administered 2016-05-21 – 2016-05-24 (×7): 1.2 g via ORAL
  Filled 2016-05-20 (×8): qty 1

## 2016-05-20 MED ORDER — SODIUM CHLORIDE 0.9 % IV SOLN: 250.0000 mL | INTRAVENOUS | Status: DC | PRN

## 2016-05-20 MED ORDER — RISAQUAD PO CAPS
1.0000 | ORAL_CAPSULE | Freq: Every day | ORAL | Status: DC
  Administered 2016-05-21 – 2016-05-24 (×4): 1 via ORAL
  Filled 2016-05-20 (×4): qty 1

## 2016-05-20 MED ORDER — TINIDAZOLE 500 MG PO TABS
500.0000 mg | ORAL_TABLET | Freq: Every day | ORAL | Status: DC
  Administered 2016-05-21 – 2016-05-24 (×4): 500 mg via ORAL
  Filled 2016-05-20 (×4): qty 1

## 2016-05-20 MED ORDER — ASPIRIN 81 MG PO CHEW
324.0000 mg | CHEWABLE_TABLET | ORAL | Status: AC
  Administered 2016-05-20: 324 mg via ORAL
  Filled 2016-05-20: qty 4

## 2016-05-20 MED ORDER — HEPARIN BOLUS VIA INFUSION
6000.0000 [IU] | Freq: Once | INTRAVENOUS | Status: AC
  Administered 2016-05-20: 6000 [IU] via INTRAVENOUS
  Filled 2016-05-20: qty 6000

## 2016-05-20 NOTE — ED Triage Notes (Signed)
Pt had an anal fistula surgery at [facility] a month ago. Since the surgery, pt has mostly been in bed recovering. Over the past two days pt has become very SOB, esp with exersion. EMS reports ashen in color, diaphoretic upon arrival. Lungs clear per EMS. HR 130's  145/89. Sats 70-100% on nonrebreather per EMS. Pt does not wear home O2. CBG 105.

## 2016-05-20 NOTE — ED Notes (Signed)
Patient transported to CT 

## 2016-05-20 NOTE — H&P (Signed)
PULMONARY / CRITICAL CARE MEDICINE   Name: Jesse English MRN: [id number] DOB: 1961/03/16    ADMISSION DATE:  05/20/2016 CONSULTATION DATE:  05/20/2016  REFERRING MD:  Dr. Darl Householder  CHIEF COMPLAINT:  Shortness of breath and near syncope secondary to submassive PE.  HISTORY OF PRESENT ILLNESS:   Jesse English is a 55 y/o man with past medical history of Crohn's disease and multiple colonic and rectal fistulae.  He has been immobilized for about a month due to his rectal abscess and fistulae and developed right leg swelling about 10-14 days ago.  1 week ago he started to develop some shortness of breath which worsened significantly the day prior to admission.  The day of admission he had severe shortness of breath and near syncope when walking to the bathroom.  EMS was called and he was transported to the [facility] ED where he was found to have a large, bilateral pulmonary embolus.    PAST MEDICAL HISTORY :  He  has a past medical history of Colon polyps; Crohn's colitis ([place]); Diverticulosis; Fournier's gangrene in male (04/2016); Gout; Hypertension; Perirectal abscess (04/2016); Prolapsed internal hemorrhoid, grade 4; and Sleep apnea.  PAST SURGICAL HISTORY: He  has a past surgical history that includes Anal fissure repair; Incision and drainage abscess (N/A, 04/21/2016); Hemorrhoid surgery (04/21/2016); and Colonoscopy.  Allergies  Allergen Reactions  . Flomax [Tamsulosin] Other (See Comments)    Dizziness, orthostasis, especially in shower  . Ace Inhibitors Swelling    Facial swelling and small intestinal swelling.    No current facility-administered medications on file prior to encounter.    Current Outpatient Prescriptions on File Prior to Encounter  Medication Sig  . amLODipine (NORVASC) 5 MG tablet Take 2.5 mg by mouth daily. Pt takes 2.34m daily  . LIALDA 1.2 g EC tablet Take 1.2 g by mouth 2 (two) times daily.   . Probiotic Product (VSL#3) CAPS Take 1 capsule by mouth daily. Begin  after 2nd week of Vancomycin  . tinidazole (TINDAMAX) 500 MG tablet Take 1 tablet (500 mg total) by mouth daily with breakfast. For 4 weeks  . mesalamine (PENTASA) 500 MG CR capsule Take 2 capsules (1,000 mg total) by mouth 4 (four) times daily. (Patient taking differently: Take 1,000 mg by mouth daily. )    FAMILY HISTORY:  No family history of clotting disorders.  SOCIAL HISTORY: He  reports that he quit smoking about 2 years ago. His smoking use included Cigars. He has never used smokeless tobacco. He reports that he does not drink alcohol or use drugs.  REVIEW OF SYSTEMS:   A review of 14 systems was negative except as stated in the HPI.   SUBJECTIVE:  55y/o man with Crohn's disease and recent surgical intervention (10/13) for Fournier's gangrene and rectal abscesses now with large PE and likely associated DVT.  VITAL SIGNS: BP 104/82   Pulse 118   Temp 97.9 F (36.6 C) (Oral)   Resp 23   Ht 5' 11"  (1.803 m)   Wt 95.3 kg (210 lb)   SpO2 100%   BMI 29.29 kg/m   HEMODYNAMICS:    VENTILATOR SETTINGS:    INTAKE / OUTPUT: No intake/output data recorded.  PHYSICAL EXAMINATION: General:  Laying in bed, no apparent distress Neuro:  Alert and oriented x4 HEENT:  PERRL, EOMI, OP clear Cardiovascular:  Tachycardic, no MRG Lungs:  CTAB, no WRR Abdomen:  Soft, NTND, No HSM palpated Musculoskeletal:  Trace RLL edema and warmth, no joint abnormalities Skin:  Darkened skin over ankles bilaterally.  LABS:  BMET  Recent Labs Lab 05/18/16 1150 05/20/16 1807 05/20/16 1816  NA 137 137 140  K 4.2 3.4* 3.4*  CL 103 104 105  CO2 26 20*  --   BUN 14 11 11   CREATININE 1.05 1.33* 1.20  GLUCOSE 104* 162* 158*    Electrolytes  Recent Labs Lab 05/18/16 1150 05/20/16 1807  CALCIUM 9.1 8.9    CBC  Recent Labs Lab 05/18/16 1150 05/20/16 1807 05/20/16 1816  WBC 8.4 12.6*  --   HGB 11.0* 12.0* 12.9*  HCT 33.1* 36.6* 38.0*  PLT 281.0 254  --     Coag's No  results for input(s): APTT, INR in the last 168 hours.  Sepsis Markers  Recent Labs Lab 05/20/16 1817 05/20/16 2042  LATICACIDVEN 3.07* 1.60    ABG No results for input(s): PHART, PCO2ART, PO2ART in the last 168 hours.  Liver Enzymes  Recent Labs Lab 05/18/16 1150 05/20/16 1807  AST 23 29  ALT 47 37  ALKPHOS 39 42  BILITOT 0.6 0.7  ALBUMIN 3.2* 3.1*    Cardiac Enzymes No results for input(s): TROPONINI, PROBNP in the last 168 hours.  Glucose No results for input(s): GLUCAP in the last 168 hours.  Imaging Ct Angio Chest Pe W And/or Wo Contrast  Result Date: 05/20/2016 CLINICAL DATA:  Progressive dyspnea. Recent anal fistula necessitating being bedridden for months now with leg swelling. EXAM: CT ANGIOGRAPHY CHEST WITH CONTRAST TECHNIQUE: Multidetector CT imaging of the chest was performed using the standard protocol during bolus administration of intravenous contrast. Multiplanar CT image reconstructions and MIPs were obtained to evaluate the vascular anatomy. CONTRAST:  80 cc Isovue 370 COMPARISON:  Same day CXR, CT abdomen 04/20/2016 FINDINGS: Cardiovascular: There is an acute saddle pulmonary embolus extending into the lobar, segmental and subsegmental arteries bilaterally of the lobes of both lungs. Right heart strain is noted with RV to LV ratio of 1.85. The heart is top normal without pericardial effusion. There is coronary arteriosclerosis. No thoracic aortic aneurysm or dissection. Mediastinum/Nodes: No enlarged mediastinal, hilar, or axillary lymph nodes. Thyroid gland, trachea, and esophagus demonstrate no significant findings. Lungs/Pleura: There is an approximately 7.3 mm in average calcification in the left lower lobe consistent with a granuloma. Also within the left lower lobe is a new small ovoid 28 x 9 mm pleural-based opacity possibly representing a focus of rounded atelectasis. There is a similar new 7 mm pleural-based density in the right lower lobe. Trace left  effusion and/or pleural thickening. Upper Abdomen: No acute abnormality. Musculoskeletal: No acute nor suspicious osseous lesions. Mild degenerative disc disease with vacuum disc at T8-9 and T11-12. Review of the MIP images confirms the above findings. IMPRESSION: Positive for acute PE with CT evidence of right heart strain (RV/LV Ratio = 1.85) consistent with at least submassive (intermediate risk) PE. The presence of right heart strain has been associated with an increased risk of morbidity and mortality. Please activate Code PE by paging [phone]. Critical Value/emergent results were called by telephone at the time of interpretation on 05/20/2016 at 8:16 pm to Dr. Darlyn Chamber GADDY , who verbally acknowledged these results. Other nonemergent findings include a stable calcified nodule in the left lower lobe consistent a calcified granuloma. New bibasilar atelectasis left greater than right. Coronary arteriosclerosis. Electronically Signed   By: Ashley Royalty M.D.   On: 05/20/2016 20:18   Mr Pelvis W Wo Contrast  Result Date: 05/20/2016 CLINICAL DATA:  55 year old male with Crohn  disease, anal fistula and history of multiple rectal abscess drainages. Persistent rectal pain and loose stools. EXAM: MRI PELVIS WITHOUT AND WITH CONTRAST TECHNIQUE: Multiplanar multisequence MR imaging of the pelvis was performed both before and after administration of intravenous contrast. CONTRAST:  20 cc MultiHance IV. COMPARISON:  04/20/2016 CT abdomen/pelvis. FINDINGS: Reproductive: Mild nodular hypertrophy of the median lobe of the prostate. Prostate dimensions 4.6 x 4.2 x 4.3 cm (volume = 43 cm^3). Bladder: Collapsed and grossly normal bladder. Normal visualized urethra. Bowel: There is a complex grade 4 parks classification transsphincteric perianal fistula originating between 5 and 6 o'clock in the posterior anal wall (series 8/ image 20), with adjacent posterior intersphincteric space 1.1 x 0.7 x 1.3 cm gas containing  abscess (series 12/ image 21), and with complex "horseshoe" inflammatory involvement of the left and anterior ischiorectal fossa, including an approximately 5 cm in length curvilinear fistula extending to the skin surface in the medial left gluteal fold, and a separate approximately 6 cm in length curvilinear fistula extending to the skin surface in the right perineum. Both of the cutaneous fistula tracts originating from the ischiorectal fossa demonstrate thick enhancing walls and are distended with pus up to 11 mm diameter. There is no involvement of the supralevator space. The visualized rectum appears normal. Vascular/Lymphatic: No pathologically enlarged lymph nodes in the pelvis. Other: No abnormal free fluid in the pelvis. Musculoskeletal: No aggressive appearing focal osseous lesions. IMPRESSION: 1. Complex grade 4 Parks classification transsphincteric perianal fistula originating between 5 and 6 o'clock in the posterior anal wall, with associated small posterior intersphincteric space gas-containing abscess and bilateral pus-filled cutaneous fistula tracts originating from the ischiorectal fossa extending to the right perineum and medial left gluteal fold. 2. Mildly enlarged prostate due to mild nodular hypertrophy of the median lobe of the prostate. Electronically Signed   By: Ilona Sorrel M.D.   On: 05/20/2016 08:26   Dg Chest Port 1 View  Result Date: 05/20/2016 CLINICAL DATA:  Pt c/o being faint and SOB x 2 days. Hx pneumonia. Former smoker EXAM: PORTABLE CHEST 1 VIEW COMPARISON:  None. FINDINGS: Normal mediastinum and cardiac silhouette. Normal pulmonary vasculature. No evidence of effusion, infiltrate, or pneumothorax. No acute bony abnormality. IMPRESSION: No acute cardiopulmonary process. Electronically Signed   By: Suzy Bouchard M.D.   On: 05/20/2016 18:49     STUDIES:  05/20/16 - CTA - Submassive PE   CULTURES: none  ANTIBIOTICS: Ciprofloxacin and Tinidazole - home medications  for fistula  SIGNIFICANT EVENTS:   LINES/TUBES:   DISCUSSION: 55 y/o man with submassive PE.  Surgery on 10/13 for perirectal abscess with persistent fistulae 2/2 Crohn's disease. On heparin gtt.    ASSESSMENT / PLAN:  PULMONARY A: Submassive PE P:   Heparin gtt Consider catheter directed lytic therapy if no improvement in right heart strain over the next 24-48 hours after discussing with surgical team.   CARDIOVASCULAR A:  Hypertension P:  Hold amlodipine  RENAL A:   H/U urinary retention P:   On alfuzosin as outpt - hold for now due to hypotension on arrival.   Did require long term (3 weeks) foley for urinary retention after previous admission. Monitor for retention   GASTROINTESTINAL A:   Crohn's disease, h/o recurrent C. dif P:   Seen by GI doctor (Dr. Silverio Decamp) on 11/9 - started on ciprofloxacin and tinidazole - will continue  Continue mesalamine    INFECTIOUS A:   H/O recurrent C. dif  P:   Continue home probiotic  FAMILY  - Updates:   - Inter-disciplinary family meet or Palliative Care meeting due by:  05/27/16   Reginia Forts, MD, PhD Pulmonary and [place] Pager: [phone]  05/20/2016, 10:24 PM

## 2016-05-20 NOTE — Progress Notes (Signed)
ANTICOAGULATION CONSULT NOTE - Initial Consult  Pharmacy Consult for heparin Indication: pulmonary embolus  Allergies  Allergen Reactions  . Flomax [Tamsulosin] Other (See Comments)    Dizziness, orthostasis, especially in shower  . Ace Inhibitors Swelling    Facial swelling and small intestinal swelling.    Patient Measurements: Height: 5\' 11"  (180.3 cm) Weight: 210 lb (95.3 kg) IBW/kg (Calculated) : 75.3 Heparin Dosing Weight: 94.5 kg  Vital Signs: Temp: 97.9 F (36.6 C) (11/11 1727) Temp Source: Oral (11/11 1727) BP: 110/83 (11/11 2000) Pulse Rate: 120 (11/11 2000)  Labs:  Recent Labs  05/18/16 1150 05/20/16 1807 05/20/16 1816  HGB 11.0* 12.0* 12.9*  HCT 33.1* 36.6* 38.0*  PLT 281.0 254  --   CREATININE 1.05 1.33* 1.20    Estimated Creatinine Clearance: 80 mL/min (by C-G formula based on SCr of 1.2 mg/dL).  Assessment: 55 yo m presenting with SOB  PMH: crohns, gout, HTN  Anticoag: none pta - new onset PE iv hep now  Nephro: SCr 1.2  Heme/Onc: H&H 12.9/38, Plt 254  Goal of Therapy:  Heparin level 0.3-0.7 units/ml Monitor platelets by anticoagulation protocol: Yes   Plan:  Heparin bolus 6000 units x 1  Heparin infusion 1650 units/hr Daily HL, CBC F/U [facility]  Levester Fresh, PharmD, BCPS, [facility] Clinical Pharmacist Pager [phone] 05/20/2016 8:21 PM

## 2016-05-20 NOTE — ED Provider Notes (Signed)
[facility] DEPT Provider Note   CSN: [id number] Arrival date & time: 05/20/16  1717  History   Chief Complaint Chief Complaint  Patient presents with  . Shortness of Breath   HPI Jesse English is a 55 y.o. male.   Shortness of Breath  This is a new problem. The problem occurs continuously.The current episode started more than 2 days ago. The problem has been gradually worsening. Associated symptoms include a fever, leg pain and leg swelling. Pertinent negatives include no sputum production and no orthopnea. Associated symptoms comments: Chest tightness  Dry cough.    Past Medical History:  Diagnosis Date  . Colon polyps    2013  . Crohn's colitis ([place])    vs UC with abscess  . Diverticulosis   . Fournier's gangrene in male 04/2016  . Gout   . Hypertension   . Perirectal abscess 04/2016  . Prolapsed internal hemorrhoid, grade 4   . Sleep apnea    cpap    Patient Active Problem List   Diagnosis Date Noted  . Acute urinary retention s/p Foley 04/15/2016 04/22/2016  . Fournier's gangrene in male s/p I&D 04/21/2016 04/21/2016  . Ulcerative colitis, chronic, with rectal bleeding  12/22/2015    Past Surgical History:  Procedure Laterality Date  . ANAL FISSURE REPAIR    . COLONOSCOPY    . HEMORRHOID SURGERY  04/21/2016   Procedure: HEMORRHOIDECTOMY;  Surgeon: Michael Boston, MD;  Location: [facility] ORS;  Service: General;;  . INCISION AND DRAINAGE ABSCESS N/A 04/21/2016   Procedure: INCISION AND DRAINAGE OF HORSESHOE  FOURNIER GANGRENE PERIRECTAL ABSCESS;  Surgeon: Michael Boston, MD;  Location: [facility] ORS;  Service: General;  Laterality: N/A;       Home Medications    Prior to Admission medications   Medication Sig Start Date End Date Taking? Authorizing Provider  amLODipine (NORVASC) 5 MG tablet Take 5 mg by mouth daily. Pt takes 2.5mg  daily 04/13/16   Historical Provider, MD  LIALDA 1.2 g EC tablet Take 2.4 g by mouth 2 (two) times daily.  02/24/16   Historical  Provider, MD  mesalamine (PENTASA) 500 MG CR capsule Take 2 capsules (1,000 mg total) by mouth 4 (four) times daily. Patient taking differently: Take 1,000 mg by mouth daily.  04/10/16   Mauri Pole, MD  Probiotic Product (VSL#3) CAPS Take 1 capsule by mouth daily. Begin after 2nd week of Vancomycin 02/04/16   Mauri Pole, MD  tinidazole (TINDAMAX) 500 MG tablet Take 1 tablet (500 mg total) by mouth daily with breakfast. For 4 weeks 05/18/16   Mauri Pole, MD    Family History Family History  Problem Relation Age of Onset  . Colon cancer Neg Hx   . Esophageal cancer Neg Hx   . Stomach cancer Neg Hx   . Rectal cancer Neg Hx     Social History Social History  Substance Use Topics  . Smoking status: Former Smoker    Types: Cigars    Quit date: 07/10/2013  . Smokeless tobacco: Never Used  . Alcohol use No     Comment: 2 per day   Allergies   Flomax [tamsulosin] and Ace inhibitors  Review of Systems Review of Systems  Constitutional: Positive for fever.  Respiratory: Positive for shortness of breath. Negative for sputum production.   Cardiovascular: Positive for leg swelling. Negative for orthopnea.  All other systems reviewed and are negative.  Physical Exam Updated Vital Signs BP (!) 88/69 (BP Location: Right Arm)  Temp 97.9 F (36.6 C) (Oral)   Resp 22   Ht 5\' 11"  (1.803 m)   Wt 95.3 kg   SpO2 97%   BMI 29.29 kg/m   Physical Exam  Constitutional: He is oriented to person, place, and time. He appears well-developed and well-nourished. He appears distressed.  HENT:  Head: Normocephalic and atraumatic.  Eyes: EOM are normal. Pupils are equal, round, and reactive to light.  Neck: Normal range of motion.  Cardiovascular:  No murmur heard. Tachycardic in 130's  Pulmonary/Chest: He is in respiratory distress. He has no wheezes. He has no rales.  Tachypneic  Abdominal: Soft. He exhibits no distension. There is no tenderness. There is no guarding.    Musculoskeletal: Normal range of motion.  Right lower extremity edema> left.  Slight erythema noted  Neurological: He is alert and oriented to person, place, and time. He displays normal reflexes. No cranial nerve deficit. Coordination normal.  Skin: Skin is warm. Capillary refill takes less than 2 seconds.  Psychiatric: He has a normal mood and affect. His behavior is normal. Thought content normal.  Nursing note and vitals reviewed.    ED Treatments / Results  Labs (all labs ordered are listed, but only abnormal results are displayed) Labs Reviewed  COMPREHENSIVE METABOLIC PANEL - Abnormal; Notable for the following:       Result Value   Potassium 3.4 (*)    CO2 20 (*)    Glucose, Bld 162 (*)    Creatinine, Ser 1.33 (*)    Albumin 3.1 (*)    GFR calc non Af Amer 58 (*)    All other components within normal limits  CBC WITH DIFFERENTIAL/PLATELET - Abnormal; Notable for the following:    WBC 12.6 (*)    RBC 4.06 (*)    Hemoglobin 12.0 (*)    HCT 36.6 (*)    Neutro Abs 9.9 (*)    Monocytes Absolute 1.2 (*)    All other components within normal limits  URINALYSIS, ROUTINE W REFLEX MICROSCOPIC (NOT AT [facility]) - Abnormal; Notable for the following:    Specific Gravity, Urine 1.038 (*)    Ketones, ur 15 (*)    Protein, ur 30 (*)    All other components within normal limits  BRAIN NATRIURETIC PEPTIDE - Abnormal; Notable for the following:    B Natriuretic Peptide 269.4 (*)    All other components within normal limits  URINE MICROSCOPIC-ADD ON - Abnormal; Notable for the following:    Squamous Epithelial / LPF 0-5 (*)    Bacteria, UA RARE (*)    All other components within normal limits  I-STAT CG4 LACTIC ACID, ED - Abnormal; Notable for the following:    Lactic Acid, Venous 3.07 (*)    All other components within normal limits  I-STAT TROPOININ, ED - Abnormal; Notable for the following:    Troponin i, poc 0.10 (*)    All other components within normal limits  I-STAT CHEM  8, ED - Abnormal; Notable for the following:    Potassium 3.4 (*)    Glucose, Bld 158 (*)    Calcium, Ion 1.09 (*)    Hemoglobin 12.9 (*)    HCT 38.0 (*)    All other components within normal limits  CULTURE, BLOOD (ROUTINE X 2)  CULTURE, BLOOD (ROUTINE X 2)  URINE CULTURE  HEPARIN LEVEL (UNFRACTIONATED)  CBC  BASIC METABOLIC PANEL  I-STAT CG4 LACTIC ACID, ED  TYPE AND SCREEN  ABO/RH    EKG  EKG Interpretation  Date/Time:  Saturday May 20 2016 17:27:08 EST Ventricular Rate:  135 PR Interval:    QRS Duration: 91 QT Interval:  306 QTC Calculation: 459 R Axis:   92 Text Interpretation:  Sinus tachycardia Ventricular premature complex Borderline right axis deviation Abnormal R-wave progression, late transition Nonspecific repol abnormality, inferior leads Baseline wander in lead(s) I II aVR rate related changes, similar to previous earlier in the day. No STEMi per Dr. Beatrix Shipper by Darl Householder  MD, [place] ([id number]) on 05/20/2016 6:13:42 PM       Radiology Ct Angio Chest Pe W And/or Wo Contrast  Result Date: 05/20/2016 CLINICAL DATA:  Progressive dyspnea. Recent anal fistula necessitating being bedridden for months now with leg swelling. EXAM: CT ANGIOGRAPHY CHEST WITH CONTRAST TECHNIQUE: Multidetector CT imaging of the chest was performed using the standard protocol during bolus administration of intravenous contrast. Multiplanar CT image reconstructions and MIPs were obtained to evaluate the vascular anatomy. CONTRAST:  80 cc Isovue 370 COMPARISON:  Same day CXR, CT abdomen 04/20/2016 FINDINGS: Cardiovascular: There is an acute saddle pulmonary embolus extending into the lobar, segmental and subsegmental arteries bilaterally of the lobes of both lungs. Right heart strain is noted with RV to LV ratio of 1.85. The heart is top normal without pericardial effusion. There is coronary arteriosclerosis. No thoracic aortic aneurysm or dissection. Mediastinum/Nodes: No enlarged mediastinal,  hilar, or axillary lymph nodes. Thyroid gland, trachea, and esophagus demonstrate no significant findings. Lungs/Pleura: There is an approximately 7.3 mm in average calcification in the left lower lobe consistent with a granuloma. Also within the left lower lobe is a new small ovoid 28 x 9 mm pleural-based opacity possibly representing a focus of rounded atelectasis. There is a similar new 7 mm pleural-based density in the right lower lobe. Trace left effusion and/or pleural thickening. Upper Abdomen: No acute abnormality. Musculoskeletal: No acute nor suspicious osseous lesions. Mild degenerative disc disease with vacuum disc at T8-9 and T11-12. Review of the MIP images confirms the above findings. IMPRESSION: Positive for acute PE with CT evidence of right heart strain (RV/LV Ratio = 1.85) consistent with at least submassive (intermediate risk) PE. The presence of right heart strain has been associated with an increased risk of morbidity and mortality. Please activate Code PE by paging [phone]. Critical Value/emergent results were called by telephone at the time of interpretation on 05/20/2016 at 8:16 pm to Dr. Darlyn Chamber Audery Wassenaar , who verbally acknowledged these results. Other nonemergent findings include a stable calcified nodule in the left lower lobe consistent a calcified granuloma. New bibasilar atelectasis left greater than right. Coronary arteriosclerosis. Electronically Signed   By: Ashley Royalty M.D.   On: 05/20/2016 20:18   Mr Pelvis W Wo Contrast  Result Date: 05/20/2016 CLINICAL DATA:  55 year old male with Crohn disease, anal fistula and history of multiple rectal abscess drainages. Persistent rectal pain and loose stools. EXAM: MRI PELVIS WITHOUT AND WITH CONTRAST TECHNIQUE: Multiplanar multisequence MR imaging of the pelvis was performed both before and after administration of intravenous contrast. CONTRAST:  20 cc MultiHance IV. COMPARISON:  04/20/2016 CT abdomen/pelvis. FINDINGS:  Reproductive: Mild nodular hypertrophy of the median lobe of the prostate. Prostate dimensions 4.6 x 4.2 x 4.3 cm (volume = 43 cm^3). Bladder: Collapsed and grossly normal bladder. Normal visualized urethra. Bowel: There is a complex grade 4 parks classification transsphincteric perianal fistula originating between 5 and 6 o'clock in the posterior anal wall (series 8/ image 20), with adjacent posterior intersphincteric space 1.1 x  0.7 x 1.3 cm gas containing abscess (series 12/ image 21), and with complex "horseshoe" inflammatory involvement of the left and anterior ischiorectal fossa, including an approximately 5 cm in length curvilinear fistula extending to the skin surface in the medial left gluteal fold, and a separate approximately 6 cm in length curvilinear fistula extending to the skin surface in the right perineum. Both of the cutaneous fistula tracts originating from the ischiorectal fossa demonstrate thick enhancing walls and are distended with pus up to 11 mm diameter. There is no involvement of the supralevator space. The visualized rectum appears normal. Vascular/Lymphatic: No pathologically enlarged lymph nodes in the pelvis. Other: No abnormal free fluid in the pelvis. Musculoskeletal: No aggressive appearing focal osseous lesions. IMPRESSION: 1. Complex grade 4 Parks classification transsphincteric perianal fistula originating between 5 and 6 o'clock in the posterior anal wall, with associated small posterior intersphincteric space gas-containing abscess and bilateral pus-filled cutaneous fistula tracts originating from the ischiorectal fossa extending to the right perineum and medial left gluteal fold. 2. Mildly enlarged prostate due to mild nodular hypertrophy of the median lobe of the prostate. Electronically Signed   By: Ilona Sorrel M.D.   On: 05/20/2016 08:26   Dg Chest Port 1 View  Result Date: 05/20/2016 CLINICAL DATA:  Pt c/o being faint and SOB x 2 days. Hx pneumonia. Former smoker  EXAM: PORTABLE CHEST 1 VIEW COMPARISON:  None. FINDINGS: Normal mediastinum and cardiac silhouette. Normal pulmonary vasculature. No evidence of effusion, infiltrate, or pneumothorax. No acute bony abnormality. IMPRESSION: No acute cardiopulmonary process. Electronically Signed   By: Suzy Bouchard M.D.   On: 05/20/2016 18:49   Procedures Procedures (including critical care time)  Medications Ordered in ED Medications - No data to display  Initial Impression / Assessment and Plan / ED Course  I have reviewed the triage vital signs and the nursing notes.  Pertinent labs & imaging results that were available during my care of the patient were reviewed by me and considered in my medical decision making (see chart for details).  Clinical Course    Mr. Verdejo is a pleasant 55 year old male with past medical history of Crohn's disease, multiple perirectal abscesses that recently had Fournier's gangrene last month who presents emergency Department with tachycardia, tachypnea and hypoxia. Patient endorses worsening shortness of breath, generalized weakness and near syncope over the past few days. He also endorses a dry nonproductive cough and states he has baseline fevers. He denies any sputum production. He denies any dysuria. He denies any blood in his stool.  Patient also states that he has had right lower extremity swelling and pain that started a week ago. It started in his calf and into his thigh.  Patient's story is classic for pulmonary embolism. High concerns for this given his tachycardia, hypotension and hypoxia.  Patient also had code sepsis initiated secondary to possible abscess vs PNA however I believe this to be less likely.  Patient found have a saddle embolism. Patient was placed on heparin drip. At this time hemodynamically stable although in critical condition.  Patient will be admitted to ICU for further management.   Final Clinical Impressions(s) / ED Diagnoses   Final  diagnoses:  Saddle embolus of pulmonary artery with acute cor pulmonale, unspecified chronicity ([place])      Roberto Scales, MD 05/20/16 Franklin Yao, MD 05/21/16 1553

## 2016-05-20 NOTE — ED Notes (Signed)
CRITICAL VALUE ALERT  Critical value received:  Troponin 0.10, Lactic Acid 3.07  Date of notification: 05/20/2016   Time of notification:  [id number]  Critical value read back:Yes.    Nurse who received alert:  Leodis Rains  MD notified (1st page):  [phone]. Notified Dr. Martyn Ehrich. No further orders at this time

## 2016-05-21 DIAGNOSIS — K50011 Crohn's disease of small intestine with rectal bleeding: Secondary | ICD-10-CM

## 2016-05-21 DIAGNOSIS — I2601 Septic pulmonary embolism with acute cor pulmonale: Secondary | ICD-10-CM

## 2016-05-21 LAB — BASIC METABOLIC PANEL
ANION GAP: 9 (ref 5–15)
BUN: 10 mg/dL (ref 6–20)
CHLORIDE: 108 mmol/L (ref 101–111)
CO2: 21 mmol/L — AB (ref 22–32)
Calcium: 8.3 mg/dL — ABNORMAL LOW (ref 8.9–10.3)
Creatinine, Ser: 1.15 mg/dL (ref 0.61–1.24)
GFR calc Af Amer: >60 mL/min (ref >60)
GLUCOSE: 111 mg/dL — AB (ref 65–99)
POTASSIUM: 3.9 mmol/L (ref 3.5–5.1)
Sodium: 138 mmol/L (ref 135–145)

## 2016-05-21 LAB — GLUCOSE, CAPILLARY: Glucose-Capillary: 119 mg/dL — ABNORMAL HIGH (ref 65–99)

## 2016-05-21 LAB — HEPARIN LEVEL (UNFRACTIONATED)
Heparin Unfractionated: 0.44 IU/mL (ref 0.30–0.70)
Heparin Unfractionated: 0.53 IU/mL (ref 0.30–0.70)

## 2016-05-21 LAB — CBC
HEMATOCRIT: 31 % — AB (ref 39.0–52.0)
Hemoglobin: 10 g/dL — ABNORMAL LOW (ref 13.0–17.0)
MCH: 29 pg (ref 26.0–34.0)
MCHC: 32.3 g/dL (ref 30.0–36.0)
MCV: 89.9 fL (ref 78.0–100.0)
Platelets: 221 10*3/uL (ref 150–400)
RBC: 3.45 MIL/uL — ABNORMAL LOW (ref 4.22–5.81)
RDW: 13.6 % (ref 11.5–15.5)
WBC: 9.3 10*3/uL (ref 4.0–10.5)

## 2016-05-21 LAB — MRSA PCR SCREENING: MRSA by PCR: NEGATIVE

## 2016-05-21 MED ORDER — ALFUZOSIN HCL ER 10 MG PO TB24
10.0000 mg | ORAL_TABLET | Freq: Every day | ORAL | Status: DC
  Administered 2016-05-22 – 2016-05-24 (×3): 10 mg via ORAL
  Filled 2016-05-21 (×3): qty 1

## 2016-05-21 MED ORDER — LACTATED RINGERS IV SOLN: INTRAVENOUS | Status: DC

## 2016-05-21 MED ORDER — APIXABAN 5 MG PO TABS: 5.0000 mg | ORAL_TABLET | Freq: Two times a day (BID) | ORAL | Status: DC

## 2016-05-21 MED ORDER — APIXABAN 5 MG PO TABS
10.0000 mg | ORAL_TABLET | Freq: Two times a day (BID) | ORAL | Status: DC
  Administered 2016-05-22 – 2016-05-24 (×5): 10 mg via ORAL
  Filled 2016-05-21 (×6): qty 2

## 2016-05-21 NOTE — Progress Notes (Signed)
[facility] for heparin Indication: pulmonary embolus  Allergies  Allergen Reactions  . Flomax [Tamsulosin] Other (See Comments)    Dizziness, orthostasis, especially in shower  . Ace Inhibitors Swelling    Facial swelling and small intestinal swelling.    Patient Measurements: Height: 5\' 11"  (180.3 cm) Weight: 209 lb 3.5 oz (94.9 kg) IBW/kg (Calculated) : 75.3 Heparin Dosing Weight: 94.5 kg  Vital Signs: Temp: 98 F (36.7 C) (11/11 2312) Temp Source: Oral (11/11 2312) BP: 108/76 (11/12 0300) Pulse Rate: 94 (11/12 0300)  Labs:  Recent Labs  05/18/16 1150 05/20/16 1807 05/20/16 1816 05/21/16 0226  HGB 11.0* 12.0* 12.9* 10.0*  HCT 33.1* 36.6* 38.0* 31.0*  PLT 281.0 254  --  221  HEPARINUNFRC  --   --   --  0.44  CREATININE 1.05 1.33* 1.20 1.15    Estimated Creatinine Clearance: 83.3 mL/min (by C-G formula based on SCr of 1.15 mg/dL).  Assessment: 55 y.o. male with PE for heparin  Goal of Therapy:  Heparin level 0.3-0.7 units/ml Monitor platelets by anticoagulation protocol: Yes   Plan:  Continue Heparin at current rate  Recheck level later this morning to verify  Phillis Knack, PharmD, BCPS  05/21/2016 3:44 AM

## 2016-05-21 NOTE — Progress Notes (Signed)
PULMONARY / CRITICAL CARE MEDICINE   Name: Jesse English MRN: [id number] DOB: 1961-06-16    ADMISSION DATE:  05/20/2016 CONSULTATION DATE:  05/20/2016  REFERRING MD:  Dr. Darl Householder  CHIEF COMPLAINT:  Shortness of breath and near syncope secondary to submassive PE.  BRIEF SUMMARY:   Jesse English is a 55 y/o man with past medical history of Crohn's disease and multiple colonic and rectal fistulae.  He has been immobilized for about a month due to his rectal abscess and fistulae and developed right leg swelling about 10-14 days ago.  1 week ago he started to develop some shortness of breath which worsened significantly the day prior to admission.  The day of admission he had severe shortness of breath and near syncope when walking to the bathroom.  EMS was called and he was transported to the [facility] ED where he was found to have a large, bilateral pulmonary embolus.  Admitted and started on heparin gtt.  ECHO pending.     SUBJECTIVE:  Pt denies acute chest pain, SOB, bleeding  VITAL SIGNS: BP 117/81   Pulse 88   Temp 97.7 F (36.5 C) (Oral)   Resp (!) 24   Ht 5\' 11"  (1.803 m)   Wt 209 lb 3.5 oz (94.9 kg)   SpO2 100%   BMI 29.18 kg/m   HEMODYNAMICS:    VENTILATOR SETTINGS:    INTAKE / OUTPUT: I/O last 3 completed shifts: In: 1801.5 [I.V.:801.5; IV Piggyback:1000] Out: 1000 [Urine:1000]  PHYSICAL EXAMINATION: General:  Well developed male in NAD, lying in bed Neuro:  Alert and oriented x4 HEENT:  PERRL, EOMI, OP clear Cardiovascular:  S1S2 rrr, no m/r/g Lungs:  CTAB, no WRR Abdomen:  Soft, NTND  Musculoskeletal:  RLE edema and warmth, no joint abnormalities Skin:  Darkened skin over ankles bilaterally.  LABS:  BMET  Recent Labs Lab 05/18/16 1150 05/20/16 1807 05/20/16 1816 05/21/16 0226  NA 137 137 140 138  K 4.2 3.4* 3.4* 3.9  CL 103 104 105 108  CO2 26 20*  --  21*  BUN 14 11 11 10   CREATININE 1.05 1.33* 1.20 1.15  GLUCOSE 104* 162* 158* 111*     Electrolytes  Recent Labs Lab 05/18/16 1150 05/20/16 1807 05/21/16 0226  CALCIUM 9.1 8.9 8.3*    CBC  Recent Labs Lab 05/18/16 1150 05/20/16 1807 05/20/16 1816 05/21/16 0226  WBC 8.4 12.6*  --  9.3  HGB 11.0* 12.0* 12.9* 10.0*  HCT 33.1* 36.6* 38.0* 31.0*  PLT 281.0 254  --  221    Coag's No results for input(s): APTT, INR in the last 168 hours.  Sepsis Markers  Recent Labs Lab 05/20/16 1817 05/20/16 2042  LATICACIDVEN 3.07* 1.60    ABG No results for input(s): PHART, PCO2ART, PO2ART in the last 168 hours.  Liver Enzymes  Recent Labs Lab 05/18/16 1150 05/20/16 1807  AST 23 29  ALT 47 37  ALKPHOS 39 42  BILITOT 0.6 0.7  ALBUMIN 3.2* 3.1*    Cardiac Enzymes No results for input(s): TROPONINI, PROBNP in the last 168 hours.  Glucose  Recent Labs Lab 05/20/16 2309  GLUCAP 119*    Imaging Ct Angio Chest Pe W And/or Wo Contrast  Result Date: 05/20/2016 CLINICAL DATA:  Progressive dyspnea. Recent anal fistula necessitating being bedridden for months now with leg swelling. EXAM: CT ANGIOGRAPHY CHEST WITH CONTRAST TECHNIQUE: Multidetector CT imaging of the chest was performed using the standard protocol during bolus administration of intravenous contrast. Multiplanar CT  image reconstructions and MIPs were obtained to evaluate the vascular anatomy. CONTRAST:  80 cc Isovue 370 COMPARISON:  Same day CXR, CT abdomen 04/20/2016 FINDINGS: Cardiovascular: There is an acute saddle pulmonary embolus extending into the lobar, segmental and subsegmental arteries bilaterally of the lobes of both lungs. Right heart strain is noted with RV to LV ratio of 1.85. The heart is top normal without pericardial effusion. There is coronary arteriosclerosis. No thoracic aortic aneurysm or dissection. Mediastinum/Nodes: No enlarged mediastinal, hilar, or axillary lymph nodes. Thyroid gland, trachea, and esophagus demonstrate no significant findings. Lungs/Pleura: There is an  approximately 7.3 mm in average calcification in the left lower lobe consistent with a granuloma. Also within the left lower lobe is a new small ovoid 28 x 9 mm pleural-based opacity possibly representing a focus of rounded atelectasis. There is a similar new 7 mm pleural-based density in the right lower lobe. Trace left effusion and/or pleural thickening. Upper Abdomen: No acute abnormality. Musculoskeletal: No acute nor suspicious osseous lesions. Mild degenerative disc disease with vacuum disc at T8-9 and T11-12. Review of the MIP images confirms the above findings. IMPRESSION: Positive for acute PE with CT evidence of right heart strain (RV/LV Ratio = 1.85) consistent with at least submassive (intermediate risk) PE. The presence of right heart strain has been associated with an increased risk of morbidity and mortality. Please activate Code PE by paging [phone]. Critical Value/emergent results were called by telephone at the time of interpretation on 05/20/2016 at 8:16 pm to Dr. Darlyn Chamber GADDY , who verbally acknowledged these results. Other nonemergent findings include a stable calcified nodule in the left lower lobe consistent a calcified granuloma. New bibasilar atelectasis left greater than right. Coronary arteriosclerosis. Electronically Signed   By: Ashley Royalty M.D.   On: 05/20/2016 20:18   Dg Chest Port 1 View  Result Date: 05/20/2016 CLINICAL DATA:  Pt c/o being faint and SOB x 2 days. Hx pneumonia. Former smoker EXAM: PORTABLE CHEST 1 VIEW COMPARISON:  None. FINDINGS: Normal mediastinum and cardiac silhouette. Normal pulmonary vasculature. No evidence of effusion, infiltrate, or pneumothorax. No acute bony abnormality. IMPRESSION: No acute cardiopulmonary process. Electronically Signed   By: Suzy Bouchard M.D.   On: 05/20/2016 18:49     STUDIES:  11/11 CTA Chest >> Submassive PE   CULTURES:   ANTIBIOTICS: Ciprofloxacin and Tinidazole - home medications for fistula  SIGNIFICANT  EVENTS: 11/11  Admit with PE   LINES/TUBES:   DISCUSSION: 55 y/o man admitted 11/11 with submassive PE.  Hx of recent surgery on 10/13 for perirectal abscess with persistent fistulae 2/2 Crohn's disease. On heparin gtt.    ASSESSMENT / PLAN:  PULMONARY A: Submassive PE P:   Heparin gtt  Await ECHO, LE doppler Hold catheter directed thrombolytics  No role thrombolytics at this time, hemodynamically stable Pharmacy consult for NOAC > appreciate assistance with transition, hx of Crohn's disease with rectal fistulas O2 to support sats >92% Trend troponin  CARDIOVASCULAR A:  Hypertension P:  Hold amlodipine  RENAL A:   H/U urinary retention P:   On alfuzosin as outpt - hold for now.  Restart in am 11/13.  Did require long term (3 weeks) foley for urinary retention after previous admission. Monitor for retention   GASTROINTESTINAL A:   Crohn's disease, h/o recurrent C. dif P:   Seen by GI doctor (Dr. Silverio Decamp) on 11/9 - started on ciprofloxacin and tinidazole, continue   Continue mesalamine   INFECTIOUS A:   H/O recurrent C.  dif  P:   Continue home probiotic   FAMILY  - Updates:  Patient updated on plan of care 11/12 at bedside.  Transition to medical floor.  Tx to [facility] for primary care as of 11/13.    - Inter-disciplinary family meet or Palliative Care meeting due by:  05/27/16    Jesse Gens, NP-C [facility] Pgr: [phone] or if no answer [phone] 05/21/2016, 11:21 AM

## 2016-05-21 NOTE — Progress Notes (Addendum)
ANTICOAGULATION CONSULT NOTE - Initial Consult  Pharmacy Consult for heparin Indication: pulmonary embolus  Allergies  Allergen Reactions  . Flomax [Tamsulosin] Other (See Comments)    Dizziness, orthostasis, especially in shower  . Ace Inhibitors Swelling    Facial swelling and small intestinal swelling.    Patient Measurements: Height: 5\' 11"  (180.3 cm) Weight: 209 lb 3.5 oz (94.9 kg) IBW/kg (Calculated) : 75.3 Heparin Dosing Weight: 94.5 kg  Vital Signs: Temp: 97.7 F (36.5 C) (11/12 0740) Temp Source: Oral (11/12 0740) BP: 100/76 (11/12 1000) Pulse Rate: 90 (11/12 1000)  Labs:  Recent Labs  05/18/16 1150 05/20/16 1807 05/20/16 1816 05/21/16 0226 05/21/16 0950  HGB 11.0* 12.0* 12.9* 10.0*  --   HCT 33.1* 36.6* 38.0* 31.0*  --   PLT 281.0 254  --  221  --   HEPARINUNFRC  --   --   --  0.44 0.53  CREATININE 1.05 1.33* 1.20 1.15  --     Estimated Creatinine Clearance: 83.3 mL/min (by C-G formula based on SCr of 1.15 mg/dL).  Assessment: 55 yo m presenting with SOB - CTA shows BL PE with RHS  PMH: crohns, gout, HTN  Anticoag: none pta - new onset PE iv hep now. HL 0.53 on 1650 units/hr  Nephro: SCr 1.15  Heme/Onc: H&H 10/31, Plt 221  Goal of Therapy:  Heparin level 0.3-0.7 units/ml Monitor platelets by anticoagulation protocol: Yes   Plan:  Continue heparin infusion 1650 units/hr and d/c at 0800 11/13 Begin apixaban 10 mg bid x 1 week then 5 mg bid F/U ECHO, LE Dopplers, Plan for EKOS  Levester Fresh, PharmD, BCPS, [facility] Clinical Pharmacist Pager [phone] 05/21/2016 11:01 AM

## 2016-05-22 ENCOUNTER — Inpatient Hospital Stay (HOSPITAL_COMMUNITY): Payer: 59

## 2016-05-22 ENCOUNTER — Other Ambulatory Visit: Payer: Self-pay

## 2016-05-22 DIAGNOSIS — R0602 Shortness of breath: Secondary | ICD-10-CM

## 2016-05-22 DIAGNOSIS — I2699 Other pulmonary embolism without acute cor pulmonale: Secondary | ICD-10-CM

## 2016-05-22 DIAGNOSIS — M79609 Pain in unspecified limb: Secondary | ICD-10-CM

## 2016-05-22 LAB — BASIC METABOLIC PANEL
Anion gap: 7 (ref 5–15)
BUN: 9 mg/dL (ref 6–20)
CHLORIDE: 106 mmol/L (ref 101–111)
CO2: 26 mmol/L (ref 22–32)
CREATININE: 1.11 mg/dL (ref 0.61–1.24)
Calcium: 8.5 mg/dL — ABNORMAL LOW (ref 8.9–10.3)
GFR calc Af Amer: >60 mL/min (ref >60)
GFR calc non Af Amer: >60 mL/min (ref >60)
GLUCOSE: 112 mg/dL — AB (ref 65–99)
Potassium: 4.3 mmol/L (ref 3.5–5.1)
SODIUM: 139 mmol/L (ref 135–145)

## 2016-05-22 LAB — CBC
HCT: 30.2 % — ABNORMAL LOW (ref 39.0–52.0)
HEMOGLOBIN: 9.8 g/dL — AB (ref 13.0–17.0)
MCH: 29.3 pg (ref 26.0–34.0)
MCHC: 32.5 g/dL (ref 30.0–36.0)
MCV: 90.4 fL (ref 78.0–100.0)
Platelets: 249 10*3/uL (ref 150–400)
RBC: 3.34 MIL/uL — ABNORMAL LOW (ref 4.22–5.81)
RDW: 14.1 % (ref 11.5–15.5)
WBC: 9.6 10*3/uL (ref 4.0–10.5)

## 2016-05-22 LAB — URINE CULTURE: CULTURE: NO GROWTH

## 2016-05-22 LAB — ECHOCARDIOGRAM COMPLETE
HEIGHTINCHES: 71 in
WEIGHTICAEL: 3515.2 [oz_av]

## 2016-05-22 LAB — HEPARIN LEVEL (UNFRACTIONATED): Heparin Unfractionated: 0.34 IU/mL (ref 0.30–0.70)

## 2016-05-22 MED ORDER — PERFLUTREN LIPID MICROSPHERE
INTRAVENOUS | Status: AC
  Filled 2016-05-22: qty 10

## 2016-05-22 MED ORDER — PERFLUTREN LIPID MICROSPHERE
INTRAVENOUS | Status: AC
  Administered 2016-05-22: 2 mL
  Filled 2016-05-22: qty 10

## 2016-05-22 MED ORDER — PERFLUTREN LIPID MICROSPHERE
1.0000 mL | INTRAVENOUS | Status: AC | PRN
  Filled 2016-05-22: qty 10

## 2016-05-22 NOTE — Progress Notes (Signed)
Insurance check completed for Eliquis S/W BARBARA @ [employer] # [phone]    ELIQUIS  5 MG BID   COVER- YES  CO-PAY- ZERO DOLLARS  TIER- 3 DRUG  PRIOR APPROVAL - NO  PHARMACY : [facility]

## 2016-05-22 NOTE — Progress Notes (Signed)
  Echocardiogram 2D Echocardiogram with Definity has been performed.  Tresa Res 05/22/2016, 2:22 PM

## 2016-05-22 NOTE — Progress Notes (Signed)
Called ECHO lab to make aware that Jesse English needs to be done STAT.  ECHO tech will be called. Pt resting with call bell within reach.  Will continue to monitor.

## 2016-05-22 NOTE — Progress Notes (Signed)
PROGRESS NOTE    BENETT English  OZD:[id number] DOB: 11-Sep-1960 DOA: 05/20/2016 PCP: Marton Redwood, MD   Brief Narrative: [name] is a 55 y/o man with past medical history of Crohn's disease and multiple colonic and rectal fistulae.  He has been immobilized for about a month due to his rectal abscess and fistulae and developed right leg swelling about 10-14 days ago.  1 week ago he started to develop some shortness of breath which worsened significantly the day prior to admission.  The day of admission he had severe shortness of breath and near syncope when walking to the bathroom.  EMS was called and he was transported to the [facility] ED where he was found to have a large, bilateral pulmonary embolus.      Assessment & Plan:   Principal Problem:   Pulmonary embolus (HCC) Active Problems:   Crohn's disease ([place])   Rectal fistula  1-Sub-Massive PE;  Was initially on IV heparin. Transition to eliquis this morning.  Doppler LE with extensive DVT right LE, left LE with DVT distal popliteal vein, extending to calf.  Discussed report LE with Dr Lake Bells, he recommend awaiting ECHO results and CCM to round on patient today to determine if patient needs IVC filter. CCM consulted.   2-Crohn's; Diseases. h/o recurrent C. dif Monitor Hb, patient on eliquis Continue mesalamine  Seen by GI doctor (Dr. Silverio Decamp) on 11/9 - started on ciprofloxacin and tinidazole, continue   I will inform to Dr Bari Edward of patient admission.   3-Hypertension; Hold Norvasc for now.   4-H/U urinary retention On alfuzosin as outpt - Restarted  11/13.   DVT prophylaxis: Eliquis.  Code Status: Full code.  Family Communication: wife at bedside.  Disposition Plan: to be determine.    Consultants:   Pulmonary    Procedures:   ECHO pending.   Doppler; extensive DVT right LE, left LE DVT distal popliteal.   Antimicrobials:  Cipro.   Subjective: He is feeling better, dyspnea improved.    Objective: Vitals:   05/21/16 1500 05/21/16 1531 05/21/16 2100 05/22/16 0500  BP: 115/83 119/75 128/76 137/81  Pulse: 96 94 92 86  Resp: (!) 21 18 20 18   Temp:  97.6 F (36.4 C) 98.2 F (36.8 C) 98.6 F (37 C)  TempSrc:  Tympanic Oral Oral  SpO2: 100% 100% 99% 100%  Weight:  97.1 kg (214 lb)  99.7 kg (219 lb 11.2 oz)  Height:        Intake/Output Summary (Last 24 hours) at 05/22/16 1318 Last data filed at 05/22/16 1054  Gross per 24 hour  Intake           779.25 ml  Output             1125 ml  Net          -345.75 ml   Filed Weights   05/21/16 0147 05/21/16 1531 05/22/16 0500  Weight: 94.9 kg (209 lb 3.5 oz) 97.1 kg (214 lb) 99.7 kg (219 lb 11.2 oz)    Examination:  General exam: Appears calm and comfortable  Respiratory system: Clear to auscultation. Respiratory effort normal. Cardiovascular system: S1 & S2 heard, RRR. No JVD, murmurs, rubs, gallops or clicks. No pedal edema. Gastrointestinal system: Abdomen is nondistended, soft and nontender. No organomegaly or masses felt. Normal bowel sounds heard. Central nervous system: Alert and oriented. No focal neurological deficits. Extremities: Symmetric 5 x 5 power. Right LE mild edema.  Skin: No rashes, lesions or ulcers Psychiatry:  Judgement and insight appear normal. Mood & affect appropriate.     Data Reviewed: I have personally reviewed following labs and imaging studies  CBC:  Recent Labs Lab 05/18/16 1150 05/20/16 1807 05/20/16 1816 05/21/16 0226 05/22/16 0401  WBC 8.4 12.6*  --  9.3 9.6  NEUTROABS 6.0 9.9*  --   --   --   HGB 11.0* 12.0* 12.9* 10.0* 9.8*  HCT 33.1* 36.6* 38.0* 31.0* 30.2*  MCV 89.3 90.1  --  89.9 90.4  PLT 281.0 254  --  221 790   Basic Metabolic Panel:  Recent Labs Lab 05/18/16 1150 05/20/16 1807 05/20/16 1816 05/21/16 0226 05/22/16 0401  NA 137 137 140 138 139  K 4.2 3.4* 3.4* 3.9 4.3  CL 103 104 105 108 106  CO2 26 20*  --  21* 26  GLUCOSE 104* 162* 158* 111* 112*   BUN 14 11 11 10 9   CREATININE 1.05 1.33* 1.20 1.15 1.11  CALCIUM 9.1 8.9  --  8.3* 8.5*   GFR: Estimated Creatinine Clearance: 88.4 mL/min (by C-G formula based on SCr of 1.11 mg/dL). Liver Function Tests:  Recent Labs Lab 05/18/16 1150 05/20/16 1807  AST 23 29  ALT 47 37  ALKPHOS 39 42  BILITOT 0.6 0.7  PROT 7.3 7.4  ALBUMIN 3.2* 3.1*   No results for input(s): LIPASE, AMYLASE in the last 168 hours. No results for input(s): AMMONIA in the last 168 hours. Coagulation Profile: No results for input(s): INR, PROTIME in the last 168 hours. Cardiac Enzymes: No results for input(s): CKTOTAL, CKMB, CKMBINDEX, TROPONINI in the last 168 hours. BNP (last 3 results) No results for input(s): PROBNP in the last 8760 hours. HbA1C: No results for input(s): HGBA1C in the last 72 hours. CBG:  Recent Labs Lab 05/20/16 2309  GLUCAP 119*   Lipid Profile: No results for input(s): CHOL, HDL, LDLCALC, TRIG, CHOLHDL, LDLDIRECT in the last 72 hours. Thyroid Function Tests: No results for input(s): TSH, T4TOTAL, FREET4, T3FREE, THYROIDAB in the last 72 hours. Anemia Panel: No results for input(s): VITAMINB12, FOLATE, FERRITIN, TIBC, IRON, RETICCTPCT in the last 72 hours. Sepsis Labs:  Recent Labs Lab 05/20/16 1817 05/20/16 2042  LATICACIDVEN 3.07* 1.60    Recent Results (from the past 240 hour(s))  Clostridium Difficile by PCR     Status: None   Collection Time: 05/19/16 11:57 AM  Result Value Ref Range Status   Toxigenic C Difficile by pcr Not Detected Not Detected Final    Comment: This test is for use only with liquid or soft stools; performance characteristics of other clinical specimen types have not been established.   This assay was performed by Cepheid GeneXpert(R) PCR. The performance characteristics of this assay have been determined by [employer]. Performance characteristics refer to the analytical performance of the test.   Blood Culture (routine x 2)      Status: None (Preliminary result)   Collection Time: 05/20/16  6:07 PM  Result Value Ref Range Status   Specimen Description BLOOD RIGHT ANTECUBITAL  Final   Special Requests BOTTLES DRAWN AEROBIC AND ANAEROBIC 5CC  Final   Culture NO GROWTH < 24 HOURS  Final   Report Status PENDING  Incomplete  Blood Culture (routine x 2)     Status: None (Preliminary result)   Collection Time: 05/20/16  6:20 PM  Result Value Ref Range Status   Specimen Description BLOOD LEFT HAND  Final   Special Requests IN PEDIATRIC BOTTLE [place]  Final  Culture NO GROWTH < 24 HOURS  Final   Report Status PENDING  Incomplete  Urine culture     Status: None   Collection Time: 05/20/16  9:34 PM  Result Value Ref Range Status   Specimen Description URINE, RANDOM  Final   Special Requests NONE  Final   Culture NO GROWTH  Final   Report Status 05/22/2016 FINAL  Final  MRSA PCR Screening     Status: None   Collection Time: 05/20/16 11:12 PM  Result Value Ref Range Status   MRSA by PCR NEGATIVE NEGATIVE Final    Comment:        The GeneXpert MRSA Assay (FDA approved for NASAL specimens only), is one component of a comprehensive MRSA colonization surveillance program. It is not intended to diagnose MRSA infection nor to guide or monitor treatment for MRSA infections.          Radiology Studies: Ct Angio Chest Pe W And/or Wo Contrast  Result Date: 05/20/2016 CLINICAL DATA:  Progressive dyspnea. Recent anal fistula necessitating being bedridden for months now with leg swelling. EXAM: CT ANGIOGRAPHY CHEST WITH CONTRAST TECHNIQUE: Multidetector CT imaging of the chest was performed using the standard protocol during bolus administration of intravenous contrast. Multiplanar CT image reconstructions and MIPs were obtained to evaluate the vascular anatomy. CONTRAST:  80 cc Isovue 370 COMPARISON:  Same day CXR, CT abdomen 04/20/2016 FINDINGS: Cardiovascular: There is an acute saddle pulmonary embolus extending into  the lobar, segmental and subsegmental arteries bilaterally of the lobes of both lungs. Right heart strain is noted with RV to LV ratio of 1.85. The heart is top normal without pericardial effusion. There is coronary arteriosclerosis. No thoracic aortic aneurysm or dissection. Mediastinum/Nodes: No enlarged mediastinal, hilar, or axillary lymph nodes. Thyroid gland, trachea, and esophagus demonstrate no significant findings. Lungs/Pleura: There is an approximately 7.3 mm in average calcification in the left lower lobe consistent with a granuloma. Also within the left lower lobe is a new small ovoid 28 x 9 mm pleural-based opacity possibly representing a focus of rounded atelectasis. There is a similar new 7 mm pleural-based density in the right lower lobe. Trace left effusion and/or pleural thickening. Upper Abdomen: No acute abnormality. Musculoskeletal: No acute nor suspicious osseous lesions. Mild degenerative disc disease with vacuum disc at T8-9 and T11-12. Review of the MIP images confirms the above findings. IMPRESSION: Positive for acute PE with CT evidence of right heart strain (RV/LV Ratio = 1.85) consistent with at least submassive (intermediate risk) PE. The presence of right heart strain has been associated with an increased risk of morbidity and mortality. Please activate Code PE by paging [phone]. Critical Value/emergent results were called by telephone at the time of interpretation on 05/20/2016 at 8:16 pm to Dr. Darlyn Chamber GADDY , who verbally acknowledged these results. Other nonemergent findings include a stable calcified nodule in the left lower lobe consistent a calcified granuloma. New bibasilar atelectasis left greater than right. Coronary arteriosclerosis. Electronically Signed   By: Ashley Royalty M.D.   On: 05/20/2016 20:18   Dg Chest Port 1 View  Result Date: 05/20/2016 CLINICAL DATA:  Pt c/o being faint and SOB x 2 days. Hx pneumonia. Former smoker EXAM: PORTABLE CHEST 1 VIEW  COMPARISON:  None. FINDINGS: Normal mediastinum and cardiac silhouette. Normal pulmonary vasculature. No evidence of effusion, infiltrate, or pneumothorax. No acute bony abnormality. IMPRESSION: No acute cardiopulmonary process. Electronically Signed   By: Suzy Bouchard M.D.   On: 05/20/2016 18:49  Scheduled Meds: . acidophilus  1 capsule Oral Daily  . alfuzosin  10 mg Oral Q breakfast  . apixaban  10 mg Oral BID   Followed by  . [START ON 05/29/2016] apixaban  5 mg Oral BID  . ciprofloxacin  500 mg Oral BID  . mesalamine  1.2 g Oral BID WC  . tinidazole  500 mg Oral Q breakfast   Continuous Infusions: . lactated ringers 75 mL/hr at 05/21/16 1531     LOS: 2 days    Time spent: 35 minutes.     Elmarie Shiley, MD [facility] Pager [phone]  If 7PM-7AM, please contact night-coverage [email or web address] Password [email or web address] 05/22/2016, 1:18 PM

## 2016-05-22 NOTE — Discharge Instructions (Addendum)
Information on my medicine - ELIQUIS (apixaban)  This medication education was reviewed with me or my healthcare representative as part of my discharge preparation.  Why was Eliquis prescribed for you? Eliquis was prescribed to treat blood clots that may have been found in the veins of your legs (deep vein thrombosis) or in your lungs (pulmonary embolism) and to reduce the risk of them occurring again.  What do You need to know about Eliquis ? The starting dose is 10 mg (two 5 mg tablets) taken TWICE daily for the FIRST SEVEN (7) DAYS, then on (enter date)  05/29/2016  the dose is reduced to ONE 5 mg tablet taken TWICE daily.  Eliquis may be taken with or without food.   Try to take the dose about the same time in the morning and in the evening. If you have difficulty swallowing the tablet whole please discuss with your pharmacist how to take the medication safely.  Take Eliquis exactly as prescribed and DO NOT stop taking Eliquis without talking to the doctor who prescribed the medication.  Stopping may increase your risk of developing a new blood clot.  Refill your prescription before you run out.  After discharge, you should have regular check-up appointments with your healthcare provider that is prescribing your Eliquis.    What do you do if you miss a dose? If a dose of ELIQUIS is not taken at the scheduled time, take it as soon as possible on the same day and twice-daily administration should be resumed. The dose should not be doubled to make up for a missed dose.  Important Safety Information A possible side effect of Eliquis is bleeding. You should call your healthcare provider right away if you experience any of the following: ? Bleeding from an injury or your nose that does not stop. ? Unusual colored urine (red or dark brown) or unusual colored stools (red or black). ? Unusual bruising for unknown reasons. ? A serious fall or if you hit your head (even if there is no  bleeding).  Some medicines may interact with Eliquis and might increase your risk of bleeding or clotting while on Eliquis. To help avoid this, consult your healthcare provider or pharmacist prior to using any new prescription or non-prescription medications, including herbals, vitamins, non-steroidal anti-inflammatory drugs (NSAIDs) and supplements.  This website has more information on Eliquis (apixaban): http://www.eliquis.com/eliquis/home  Follow with Marton Redwood, MD in 5-7 days  Take Eliquis 10 mg twice daily for 5 more days, on Monday 05/29/2016 take 5 mg twice daily  Please get a complete blood count and chemistry panel checked by your Primary MD at your next visit, and again as instructed by your Primary MD. Please get your medications reviewed and adjusted by your Primary MD.  Please request your Primary MD to go over all Hospital Tests and Procedure/Radiological results at the follow up, please get all Hospital records sent to your Prim MD by signing hospital release before you go home.  If you had Pneumonia of Lung problems at the Hospital: Please get a 2 view Chest X ray done in 6-8 weeks after hospital discharge or sooner if instructed by your Primary MD.  If you have Congestive Heart Failure: Please call your Cardiologist or Primary MD anytime you have any of the following symptoms:  1) 3 pound weight gain in 24 hours or 5 pounds in 1 week  2) shortness of breath, with or without a dry hacking cough  3) swelling in the hands,  feet or stomach  4) if you have to sleep on extra pillows at night in order to breathe  Follow cardiac low salt diet and 1.5 lit/day fluid restriction.  If you have diabetes Accuchecks 4 times/day, Once in AM empty stomach and then before each meal. Log in all results and show them to your primary doctor at your next visit. If any glucose reading is under 80 or above 300 call your primary MD immediately.  If you have  Seizure/Convulsions/Epilepsy: Please do not drive, operate heavy machinery, participate in activities at heights or participate in high speed sports until you have seen by Primary MD or a Neurologist and advised to do so again.  If you had Gastrointestinal Bleeding: Please ask your Primary MD to check a complete blood count within one week of discharge or at your next visit. Your endoscopic/colonoscopic biopsies that are pending at the time of discharge, will also need to followed by your Primary MD.  Get Medicines reviewed and adjusted. Please take all your medications with you for your next visit with your Primary MD  Please request your Primary MD to go over all hospital tests and procedure/radiological results at the follow up, please ask your Primary MD to get all Hospital records sent to his/her office.  If you experience worsening of your admission symptoms, develop shortness of breath, life threatening emergency, suicidal or homicidal thoughts you must seek medical attention immediately by calling 911 or calling your MD immediately  if symptoms less severe.  You must read complete instructions/literature along with all the possible adverse reactions/side effects for all the Medicines you take and that have been prescribed to you. Take any new Medicines after you have completely understood and accpet all the possible adverse reactions/side effects.   Do not drive or operate heavy machinery when taking Pain medications.   Do not take more than prescribed Pain, Sleep and Anxiety Medications  Special Instructions: If you have smoked or chewed Tobacco  in the last 2 yrs please stop smoking, stop any regular Alcohol  and or any Recreational drug use.  Wear Seat belts while driving.  Please note You were cared for by a hospitalist during your hospital stay. If you have any questions about your discharge medications or the care you received while you were in the hospital after you are  discharged, you can call the unit and asked to speak with the hospitalist on call if the hospitalist that took care of you is not available. Once you are discharged, your primary care physician will handle any further medical issues. Please note that NO REFILLS for any discharge medications will be authorized once you are discharged, as it is imperative that you return to your primary care physician (or establish a relationship with a primary care physician if you do not have one) for your aftercare needs so that they can reassess your need for medications and monitor your lab values.  You can reach the hospitalist office at phone [phone] or fax [phone]   If you do not have a primary care physician, you can call [phone] for a physician referral.  Activity: As tolerated with Full fall precautions use walker/cane & assistance as needed  Diet: regular  Disposition Home

## 2016-05-22 NOTE — Progress Notes (Signed)
PCCM Interval Note  Vitals:   05/21/16 1531 05/21/16 2100 05/22/16 0500 05/22/16 1419  BP: 119/75 128/76 137/81 120/73  Pulse: 94 92 86 (!) 102  Resp: 18 20 18 18   Temp: 97.6 F (36.4 C) 98.2 F (36.8 C) 98.6 F (37 C) 98.4 F (36.9 C)  TempSrc: Tympanic Oral Oral Oral  SpO2: 100% 99% 100% 100%  Weight: 97.1 kg (214 lb)  99.7 kg (219 lb 11.2 oz)   Height:       Reviewed patient's TTE and LR doppler results. Echo shows intact L sided fxn, normal RA size, moderate decrease in RV systolic fxn. The doppler US confirms B LE DVT's, extensive on the R side up to the femoral. No comment regarding mobile clot. Given her hemodynamic stability I would continue her current plan for anticoagulation w Eliquis, would not pursue IVC filter placement.   Baltazar Apo, MD, PhD 05/22/2016, 4:36 PM [facility] [phone] or if no answer [phone]

## 2016-05-22 NOTE — Progress Notes (Signed)
*  PRELIMINARY RESULTS* Vascular Ultrasound Lower extremity venous duplex has been completed.  Preliminary findings: Extensive acute deep vein thrombosis seen throughout the right lower extremity.  Thrombosis extends from proximal femoral vein through calf veins. Left lower extremity positive for deep vein thrombosis in distal popliteal vein extending into calf veins. Negative for bakers cysts bilaterally.  Preliminary results called to Katharine Look, patients nurse.  Myrtie Cruise Catelin Manthe 05/22/2016, 11:06 AM

## 2016-05-22 NOTE — Progress Notes (Signed)
PCCM Interval Note  Asked to follow up with Mr Bogard regarding suitability for possible IVC filter placement. Final reads on his TTE and LE dopplers are pending. I will review when available and we will determine whether to proceed with filter.   Baltazar Apo, MD, PhD 05/22/2016, 2:29 PM [facility] [phone] or if no answer [phone]

## 2016-05-23 LAB — BASIC METABOLIC PANEL
ANION GAP: 10 (ref 5–15)
BUN: 6 mg/dL (ref 6–20)
CHLORIDE: 103 mmol/L (ref 101–111)
CO2: 25 mmol/L (ref 22–32)
Calcium: 8.5 mg/dL — ABNORMAL LOW (ref 8.9–10.3)
Creatinine, Ser: 1.14 mg/dL (ref 0.61–1.24)
GFR calc Af Amer: >60 mL/min (ref >60)
GLUCOSE: 114 mg/dL — AB (ref 65–99)
POTASSIUM: 3.7 mmol/L (ref 3.5–5.1)
Sodium: 138 mmol/L (ref 135–145)

## 2016-05-23 LAB — CBC
HCT: 31.8 % — ABNORMAL LOW (ref 39.0–52.0)
HEMOGLOBIN: 10.3 g/dL — AB (ref 13.0–17.0)
MCH: 28.9 pg (ref 26.0–34.0)
MCHC: 32.4 g/dL (ref 30.0–36.0)
MCV: 89.1 fL (ref 78.0–100.0)
PLATELETS: 266 10*3/uL (ref 150–400)
RBC: 3.57 MIL/uL — AB (ref 4.22–5.81)
RDW: 13.5 % (ref 11.5–15.5)
WBC: 10.2 10*3/uL (ref 4.0–10.5)

## 2016-05-23 LAB — URIC ACID: URIC ACID, SERUM: 4.8 mg/dL (ref 4.4–7.6)

## 2016-05-23 MED ORDER — COLCHICINE 0.6 MG PO TABS
0.6000 mg | ORAL_TABLET | Freq: Every day | ORAL | Status: DC | PRN
  Administered 2016-05-24: 0.6 mg via ORAL
  Filled 2016-05-23: qty 1

## 2016-05-23 NOTE — Progress Notes (Signed)
PROGRESS NOTE    Jesse English  SWN:[id number] DOB: 10/08/1960 DOA: 05/20/2016 PCP: Marton Redwood, MD   Brief Narrative: Jesse English is a 55 y/o man with past medical history of Crohn's disease and multiple colonic and rectal fistulae.  He has been immobilized for about a month due to his rectal abscess and fistulae and developed right leg swelling about 10-14 days ago.  1 week ago he started to develop some shortness of breath which worsened significantly the day prior to admission.  The day of admission he had severe shortness of breath and near syncope when walking to the bathroom.  EMS was called and he was transported to the [facility] ED where he was found to have a large, bilateral pulmonary embolus.      Assessment & Plan:   Principal Problem:   Pulmonary embolus (HCC) Active Problems:   Crohn's disease ([place])   Rectal fistula  1-Sub-Massive PE;  Was initially on IV heparin. Transition to eliquis 11-13 Doppler LE with extensive DVT right LE, left LE with DVT distal popliteal vein, extending to calf.  ECHO and doppler reviewed by Dr Lamonte Sakai, no need to purse IVC filter.  Monitor overnight. PT eval today .   2-Crohn's; Diseases. h/o recurrent C. dif Monitor Hb, patient on eliquis Continue mesalamine  Seen by GI doctor (Dr. Silverio Decamp) on 11/9 - started on ciprofloxacin and tinidazole, continue   I have inform to Dr Bari Edward of patient admission.  Hb stable. Repeat labs in am.   3-Hypertension; Hold Norvasc for now.   4-H/U urinary retention On alfuzosin as outpt - Restarted  11/13.   DVT prophylaxis: Eliquis.  Code Status: Full code.  Family Communication: wife at bedside.  Disposition Plan: to be determine.    Consultants:   Pulmonary    Procedures:   ECHO pending.   Doppler; extensive DVT right LE, left LE DVT distal popliteal.   Antimicrobials:  Cipro.   Subjective: He is feeling better, dyspnea improved.  Denies abdominal pain.   Objective: Vitals:   05/22/16 1419 05/22/16 1938 05/23/16 0315 05/23/16 1056  BP: 120/73 119/67 133/75   Pulse: (!) 102 (!) 104 98 92  Resp: 18 20 20    Temp: 98.4 F (36.9 C) 97.8 F (36.6 C) 98.1 F (36.7 C)   TempSrc: Oral Oral Oral   SpO2: 100% 97% 95% 95%  Weight:   99.7 kg (219 lb 11.2 oz)   Height:        Intake/Output Summary (Last 24 hours) at 05/23/16 1244 Last data filed at 05/23/16 0800  Gross per 24 hour  Intake              720 ml  Output             1200 ml  Net             -480 ml   Filed Weights   05/21/16 1531 05/22/16 0500 05/23/16 0315  Weight: 97.1 kg (214 lb) 99.7 kg (219 lb 11.2 oz) 99.7 kg (219 lb 11.2 oz)    Examination:  General exam: Appears calm and comfortable  Respiratory system: Clear to auscultation. Respiratory effort normal. Cardiovascular system: S1 & S2 heard, RRR. No JVD, murmurs, rubs, gallops or clicks. No pedal edema. Gastrointestinal system: Abdomen is nondistended, soft and nontender. No organomegaly or masses felt. Normal bowel sounds heard. Central nervous system: Alert and oriented. No focal neurological deficits. Extremities: Symmetric 5 x 5 power. Right LE mild edema.  Skin: No  rashes, lesions or ulcers Psychiatry: Judgement and insight appear normal. Mood & affect appropriate.     Data Reviewed: I have personally reviewed following labs and imaging studies  CBC:  Recent Labs Lab 05/18/16 1150 05/20/16 1807 05/20/16 1816 05/21/16 0226 05/22/16 0401 05/23/16 0153  WBC 8.4 12.6*  --  9.3 9.6 10.2  NEUTROABS 6.0 9.9*  --   --   --   --   HGB 11.0* 12.0* 12.9* 10.0* 9.8* 10.3*  HCT 33.1* 36.6* 38.0* 31.0* 30.2* 31.8*  MCV 89.3 90.1  --  89.9 90.4 89.1  PLT 281.0 254  --  221 249 754   Basic Metabolic Panel:  Recent Labs Lab 05/18/16 1150 05/20/16 1807 05/20/16 1816 05/21/16 0226 05/22/16 0401 05/23/16 0153  NA 137 137 140 138 139 138  K 4.2 3.4* 3.4* 3.9 4.3 3.7  CL 103 104 105 108 106 103  CO2 26 20*  --  21* 26 25  GLUCOSE  104* 162* 158* 111* 112* 114*  BUN 14 11 11 10 9 6   CREATININE 1.05 1.33* 1.20 1.15 1.11 1.14  CALCIUM 9.1 8.9  --  8.3* 8.5* 8.5*   GFR: Estimated Creatinine Clearance: 86.1 mL/min (by C-G formula based on SCr of 1.14 mg/dL). Liver Function Tests:  Recent Labs Lab 05/18/16 1150 05/20/16 1807  AST 23 29  ALT 47 37  ALKPHOS 39 42  BILITOT 0.6 0.7  PROT 7.3 7.4  ALBUMIN 3.2* 3.1*   No results for input(s): LIPASE, AMYLASE in the last 168 hours. No results for input(s): AMMONIA in the last 168 hours. Coagulation Profile: No results for input(s): INR, PROTIME in the last 168 hours. Cardiac Enzymes: No results for input(s): CKTOTAL, CKMB, CKMBINDEX, TROPONINI in the last 168 hours. BNP (last 3 results) No results for input(s): PROBNP in the last 8760 hours. HbA1C: No results for input(s): HGBA1C in the last 72 hours. CBG:  Recent Labs Lab 05/20/16 2309  GLUCAP 119*   Lipid Profile: No results for input(s): CHOL, HDL, LDLCALC, TRIG, CHOLHDL, LDLDIRECT in the last 72 hours. Thyroid Function Tests: No results for input(s): TSH, T4TOTAL, FREET4, T3FREE, THYROIDAB in the last 72 hours. Anemia Panel: No results for input(s): VITAMINB12, FOLATE, FERRITIN, TIBC, IRON, RETICCTPCT in the last 72 hours. Sepsis Labs:  Recent Labs Lab 05/20/16 1817 05/20/16 2042  LATICACIDVEN 3.07* 1.60    Recent Results (from the past 240 hour(s))  Clostridium Difficile by PCR     Status: None   Collection Time: 05/19/16 11:57 AM  Result Value Ref Range Status   Toxigenic C Difficile by pcr Not Detected Not Detected Final    Comment: This test is for use only with liquid or soft stools; performance characteristics of other clinical specimen types have not been established.   This assay was performed by Cepheid GeneXpert(R) PCR. The performance characteristics of this assay have been determined by [employer]. Performance characteristics refer to the analytical performance of  the test.   Blood Culture (routine x 2)     Status: None (Preliminary result)   Collection Time: 05/20/16  6:07 PM  Result Value Ref Range Status   Specimen Description BLOOD RIGHT ANTECUBITAL  Final   Special Requests BOTTLES DRAWN AEROBIC AND ANAEROBIC 5CC  Final   Culture NO GROWTH 2 DAYS  Final   Report Status PENDING  Incomplete  Blood Culture (routine x 2)     Status: None (Preliminary result)   Collection Time: 05/20/16  6:20 PM  Result  Value Ref Range Status   Specimen Description BLOOD LEFT HAND  Final   Special Requests IN PEDIATRIC BOTTLE 1CC  Final   Culture NO GROWTH 2 DAYS  Final   Report Status PENDING  Incomplete  Urine culture     Status: None   Collection Time: 05/20/16  9:34 PM  Result Value Ref Range Status   Specimen Description URINE, RANDOM  Final   Special Requests NONE  Final   Culture NO GROWTH  Final   Report Status 05/22/2016 FINAL  Final  MRSA PCR Screening     Status: None   Collection Time: 05/20/16 11:12 PM  Result Value Ref Range Status   MRSA by PCR NEGATIVE NEGATIVE Final    Comment:        The GeneXpert MRSA Assay (FDA approved for NASAL specimens only), is one component of a comprehensive MRSA colonization surveillance program. It is not intended to diagnose MRSA infection nor to guide or monitor treatment for MRSA infections.          Radiology Studies: No results found.      Scheduled Meds: . acidophilus  1 capsule Oral Daily  . alfuzosin  10 mg Oral Q breakfast  . apixaban  10 mg Oral BID   Followed by  . [START ON 05/29/2016] apixaban  5 mg Oral BID  . ciprofloxacin  500 mg Oral BID  . mesalamine  1.2 g Oral BID WC  . tinidazole  500 mg Oral Q breakfast   Continuous Infusions:    LOS: 3 days    Time spent: 35 minutes.     Elmarie Shiley, MD [facility] Pager [phone]  If 7PM-7AM, please contact night-coverage [email or web address] Password [email or web address] 05/23/2016, 12:44 PM

## 2016-05-23 NOTE — Evaluation (Addendum)
Physical Therapy Evaluation Patient Details Name: Jesse English MRN: [id number] DOB: March 24, 1961 Today's Date: 05/23/2016   History of Present Illness  55 yo admitted with bil PE and DVT. PMhx: HTN, gout, colorectal fistula with drain (04/21/16)  Clinical Impression  Pt pleasant and eager to progress with mobility. Pt with notable deficits with fatigue and pulmonary function who will benefit from acute therapy to maximize independence and endurance to return to PLOF. Patient was able to walk today with a series of short breaks for shortness of breath, which the patient attributes to not walking for a week. Patient did not have losses of balance or unsteadiness during gait, but noted that his legs felt " a little shakey" after walking. Patient had a pleasant demeanor and a supportive spouse, and would like to return home. Pt educated for pursed lip breathing, energy conservation techniques and HEP. Pt encouraged to walk 3x/day as well as continue HEP to maximize function. Pt noted to have gout pain RLE but currently not limiting gait.  Resting Hr: 92 Resting O2 sat: 95% HR with activity: 130 O2 sat with activity: 88%      Follow Up Recommendations No PT follow up    Equipment Recommendations  None recommended by PT    Recommendations for Other Services       Precautions / Restrictions Precautions Precautions: None Restrictions Weight Bearing Restrictions: No      Mobility  Bed Mobility Overal bed mobility: Independent                Transfers Overall transfer level: Modified independent Equipment used: None                Ambulation/Gait Ambulation/Gait assistance: Supervision Ambulation Distance (Feet): 350 Feet Assistive device: None Gait Pattern/deviations: WFL(Within Functional Limits)   Gait velocity interpretation: at or above normal speed for age/gender  sats dropping to 85% and needed verbal cues to stop after each 100' with 30-60 sec recovery to  regain sats to 90% or >    Stairs            Wheelchair Mobility    Modified Rankin (Stroke Patients Only)       Balance Overall balance assessment: Independent                                           Pertinent Vitals/Pain Pain Assessment: 0-10 Pain Score: 4  Pain Location: right great toe  Pain Descriptors / Indicators: Sore Pain Intervention(s): Monitored during session    Home Living Family/patient expects to be discharged to:: Private residence Living Arrangements: Spouse/significant other Available Help at Discharge: Family;Available 24 hours/day Type of Home: House Home Access: Level entry     Home Layout: One level Home Equipment: Walker - 2 wheels      Prior Function Level of Independence: Independent               Hand Dominance        Extremity/Trunk Assessment   Upper Extremity Assessment: Overall WFL for tasks assessed           Lower Extremity Assessment: Generalized weakness      Cervical / Trunk Assessment: Other exceptions  Communication   Communication: No difficulties  Cognition Arousal/Alertness: Awake/alert Behavior During Therapy: WFL for tasks assessed/performed Overall Cognitive Status: Within Functional Limits for tasks assessed  General Comments      Exercises General Exercises - Lower Extremity Long Arc Quad: AROM;Right;Left;15 reps;Seated Hip Flexion/Marching: AROM;Both;15 reps;Seated Toe Raises: AROM;Right;Left;15 reps;Seated   Assessment/Plan    PT Assessment Patient needs continued PT services  PT Problem List Cardiopulmonary status limiting activity;Decreased activity tolerance;Decreased mobility          PT Treatment Interventions Gait training;Therapeutic exercise;Functional mobility training;Therapeutic activities;Patient/family education    PT Goals (Current goals can be found in the Care Plan section)  Acute Rehab PT Goals Patient Stated  Goal: be able to return to work and gym PT Goal Formulation: With patient Time For Goal Achievement: 05/30/16 Potential to Achieve Goals: Good    Frequency Min 3X/week   Barriers to discharge        Co-evaluation               End of Session   Activity Tolerance: Patient tolerated treatment well Patient left: in chair;with call bell/phone within reach;with family/visitor present Nurse Communication: Mobility status         Time: [id number] PT Time Calculation (min) (ACUTE ONLY): 32 min   Charges:   PT Evaluation $PT Eval Moderate Complexity: 1 Procedure PT Treatments $Self Care/Home Management: 8-22   PT G Codes:        Alexa Eure, SPT 05/27/16, 11:53 AM   Seen and agree with SPT, present throughout session Elwyn Reach, [place]

## 2016-05-23 NOTE — Care Management Note (Signed)
Case Management Note Marvetta Gibbons RN, BSN Unit 2W-Case Manager [phone]  Patient Details  Name: Jesse English MRN: [id number] Date of Birth: 1961/03/21  Subjective/Objective:  Pt admitted with PE                  Action/Plan: PTA pt lived at home with spouse- independent- plan to return home, pt started on Eliquis- insurance check completed- pt with $0 copay- order placed for home 02- pt currently on RA- spoke with pt at bedside- Eliquis coverage shared- also discussed home 02 which pt states that he does not feel like he will need and at this time does not qualify for.   Expected Discharge Date:   05/24/16               Expected Discharge Plan:  Home/Self Care  In-House Referral:     Discharge planning Services  CM Consult, Medication Assistance  Post Acute Care Choice:  Durable Medical Equipment Choice offered to:  Patient  DME Arranged:  [facility] DME Agency:  NA  HH Arranged:    [place] Agency:     Status of Service:  Completed, signed off  If discussed at [place] of Stay Meetings, dates discussed:    Additional Comments:  Dawayne Patricia, RN 05/23/2016, 2:30 PM

## 2016-05-24 ENCOUNTER — Telehealth: Payer: Self-pay | Admitting: Pulmonary Disease

## 2016-05-24 LAB — CBC
HCT: 34.4 % — ABNORMAL LOW (ref 39.0–52.0)
HEMOGLOBIN: 11 g/dL — AB (ref 13.0–17.0)
MCH: 28.6 pg (ref 26.0–34.0)
MCHC: 32 g/dL (ref 30.0–36.0)
MCV: 89.4 fL (ref 78.0–100.0)
PLATELETS: 345 10*3/uL (ref 150–400)
RBC: 3.85 MIL/uL — AB (ref 4.22–5.81)
RDW: 13.9 % (ref 11.5–15.5)
WBC: 13.5 10*3/uL — AB (ref 4.0–10.5)

## 2016-05-24 MED ORDER — APIXABAN 5 MG PO TABS: 10.0000 mg | ORAL_TABLET | Freq: Two times a day (BID) | ORAL | 1 refills | Status: DC

## 2016-05-24 NOTE — Progress Notes (Signed)
Pt discharged to home with wife per MD order. Removed IV, and telemetry. CCMD notified. Picked up patients medications from pharmacy and returned to patient. AVS completed and explained with patient. No questions at this time. Volunteers took patient out via wheelchair.  Cyndia Bent

## 2016-05-24 NOTE — Telephone Encounter (Signed)
Called spoke with patient's spouse Ahmed Prima that [place] typically see NP so that pt's can be seen within a more timely manner Harmon Pier okay with this > HFU scheduled with TP for 11.29.17 @ 0900 Harmon Pier okay with this date and time Advised pt to keep the 12.14.17 appt with BQ for now and let TP assess need for follow up at visit.  Harmon Pier okay with this  Nothing further needed; will sign off

## 2016-05-24 NOTE — Discharge Summary (Signed)
Physician Discharge Summary  Jesse English BCW:[id number] DOB: [birth year] DOA: 05/20/2016  PCP: Jesse English  Admit date: 05/20/2016 Discharge date: 05/24/2016  Admitted From: home Disposition:  home  Recommendations for Outpatient Follow-up:  1. Follow up with PCP in 1-2 weeks 2. Patient started on Eliquis for PE  Home Health: none Equipment/Devices: none  Discharge Condition: stable CODE STATUS: Full Diet recommendation: regular  HPI: Per Jesse English, Jesse English is a 55 y/o man with past medical history of Crohn's disease and multiple colonic and rectal fistulae.  He has been immobilized for about a month due to his rectal abscess and fistulae and developed right leg swelling about 10-14 days ago.  1 week ago he started to develop some shortness of breath which worsened significantly the day prior to admission.  The day of admission he had severe shortness of breath and near syncope when walking to the bathroom.  EMS was called and he was transported to the [facility] ED where he was found to have a large, bilateral pulmonary embolus.    Hospital Course: Discharge Diagnoses:  Principal Problem:   Pulmonary embolus ([place]) Active Problems:   Crohn's disease ([place])   Rectal fistula  Sub-Massive PE - Was initially on IV heparin. Transition to eliquis 11-13, has remained stable, tolerated anticoagulation well without any adverse effects. Doppler LE with extensive DVT right LE, left LE with DVT distal popliteal vein, extending to calf. PCCM consulted, ECHO and doppler reviewed by Jesse English, no need to purse IVC filter per critical care. He was feeling improved, stable on room air, dyspnea significantly improved and patient was able to ambulate without further difficulties, he was discharged home in stable condition.  Crohn's; Diseases. h/o recurrent C. Dif - Monitor Hb, patient on eliquis, Hb stable, continue mesalamine, seen by Jesse English on 11/9 - started on ciprofloxacin and  tinidazole, continue.  Hypertension H/U urinary retention - On alfuzosin as outpt   Discharge Instructions     Medication List    TAKE these medications   alfuzosin 10 MG 24 hr tablet Commonly known as:  UROXATRAL Take 10 mg by mouth daily with breakfast.   amLODipine 5 MG tablet Commonly known as:  NORVASC Take 2.5 mg by mouth daily. Pt takes 2.21m daily   apixaban 5 MG Tabs tablet Commonly known as:  ELIQUIS Take 2 tablets (10 mg total) by mouth 2 (two) times daily. For 5 more days, on 05/29/16 start taking 5 mg twice daily   colchicine 0.6 MG tablet Take 0.6 mg by mouth every 8 (eight) hours as needed (gout).   LIALDA 1.2 g EC tablet Generic drug:  mesalamine Take 1.2 g by mouth 2 (two) times daily. What changed:  Another medication with the same name was changed. Make sure you understand how and when to take each.   mesalamine 500 MG CR capsule Commonly known as:  PENTASA Take 2 capsules (1,000 mg total) by mouth 4 (four) times daily. What changed:  when to take this   tinidazole 500 MG tablet Commonly known as:  TINDAMAX Take 1 tablet (500 mg total) by mouth daily with breakfast. For 4 weeks   VSL#3 Caps Take 1 capsule by mouth daily. Begin after 2nd week of Vancomycin      Follow-up Information    Jesse Maffucci English Follow up in 2 week(s).   Specialty:  Pulmonary Disease Contact information: [id number]  Allergies  Allergen Reactions  . Flomax [Tamsulosin] Other (See Comments)    Dizziness, orthostasis, especially in shower  . Ace Inhibitors Swelling    Facial swelling and small intestinal swelling.    Consultations:  Critical care  Procedures/Studies:  2D echo  Study Conclusions - Left ventricle: The cavity size was normal. There was moderate focal basal hypertrophy of the septum. Systolic function was normal. Wall motion was normal; there were no regional wall motion abnormalities. Left  ventricular diastolic function   parameters were normal. - Aortic valve: Trileaflet; normal thickness leaflets. There was no regurgitation. - Aortic root: The aortic root was normal in size. - Mitral valve: Transvalvular velocity was within the normal range. There was no evidence for stenosis. There was mild regurgitation. - Left atrium: The atrium was normal in size. - Right ventricle: The cavity size was moderately dilated. Wall thickness was normal. Systolic function was moderately reduced. - Right atrium: The atrium was normal in size. - Tricuspid valve: There was mild regurgitation. - Pulmonary arteries: Systolic pressure was within the normal range. - Inferior vena cava: The vessel was normal in size. - Pericardium, extracardiac: There was no pericardial effusion.  Impressions:  - Left ventricular systolic and diastolic function is normal. Moderately dilated right ventrcile with moderately decreased   systolic function.  Ct Angio Chest Pe W And/or Wo Contrast  Result Date: 05/20/2016 CLINICAL DATA:  Progressive dyspnea. Recent anal fistula necessitating being bedridden for months now with leg swelling. EXAM: CT ANGIOGRAPHY CHEST WITH CONTRAST TECHNIQUE: Multidetector CT imaging of the chest was performed using the standard protocol during bolus administration of intravenous contrast. Multiplanar CT image reconstructions and MIPs were obtained to evaluate the vascular anatomy. CONTRAST:  80 cc Isovue 370 COMPARISON:  Same day CXR, CT abdomen 04/20/2016 FINDINGS: Cardiovascular: There is an acute saddle pulmonary embolus extending into the lobar, segmental and subsegmental arteries bilaterally of the lobes of both lungs. Right heart strain is noted with RV to LV ratio of 1.85. The heart is top normal without pericardial effusion. There is coronary arteriosclerosis. No thoracic aortic aneurysm or dissection. Mediastinum/Nodes: No enlarged mediastinal, hilar, or axillary lymph nodes. Thyroid  gland, trachea, and esophagus demonstrate no significant findings. Lungs/Pleura: There is an approximately 7.3 mm in average calcification in the left lower lobe consistent with a granuloma. Also within the left lower lobe is a new small ovoid 28 x 9 mm pleural-based opacity possibly representing a focus of rounded atelectasis. There is a similar new 7 mm pleural-based density in the right lower lobe. Trace left effusion and/or pleural thickening. Upper Abdomen: No acute abnormality. Musculoskeletal: No acute nor suspicious osseous lesions. Mild degenerative disc disease with vacuum disc at T8-9 and T11-12. Review of the MIP images confirms the above findings. IMPRESSION: Positive for acute PE with CT evidence of right heart strain (RV/LV Ratio = 1.85) consistent with at least submassive (intermediate risk) PE. The presence of right heart strain has been associated with an increased risk of morbidity and mortality. Please activate Code PE by paging [phone]. Critical Value/emergent results were called by telephone at the time of interpretation on 05/20/2016 at 8:16 pm to Jesse. Darlyn Chamber GADDY , who verbally acknowledged these results. Other nonemergent findings include a stable calcified nodule in the left lower lobe consistent a calcified granuloma. New bibasilar atelectasis left greater than right. Coronary arteriosclerosis. Electronically Signed   By: Ashley Royalty M.D.   On: 05/20/2016 20:18   Mr Pelvis W Wo Contrast  Result Date:  05/20/2016 CLINICAL DATA:  55 year old male with Crohn disease, anal fistula and history of multiple rectal abscess drainages. Persistent rectal pain and loose stools. EXAM: MRI PELVIS WITHOUT AND WITH CONTRAST TECHNIQUE: Multiplanar multisequence MR imaging of the pelvis was performed both before and after administration of intravenous contrast. CONTRAST:  20 cc MultiHance IV. COMPARISON:  04/20/2016 CT abdomen/pelvis. FINDINGS: Reproductive: Mild nodular hypertrophy of the median  lobe of the prostate. Prostate dimensions 4.6 x 4.2 x 4.3 cm (volume = 43 cm^3). Bladder: Collapsed and grossly normal bladder. Normal visualized urethra. Bowel: There is a complex grade 4 parks classification transsphincteric perianal fistula originating between 5 and 6 o'clock in the posterior anal wall (series 8/ image 20), with adjacent posterior intersphincteric space 1.1 x 0.7 x 1.3 cm gas containing abscess (series 12/ image 21), and with complex "horseshoe" inflammatory involvement of the left and anterior ischiorectal fossa, including an approximately 5 cm in length curvilinear fistula extending to the skin surface in the medial left gluteal fold, and a separate approximately 6 cm in length curvilinear fistula extending to the skin surface in the right perineum. Both of the cutaneous fistula tracts originating from the ischiorectal fossa demonstrate thick enhancing walls and are distended with pus up to 11 mm diameter. There is no involvement of the supralevator space. The visualized rectum appears normal. Vascular/Lymphatic: No pathologically enlarged lymph nodes in the pelvis. Other: No abnormal free fluid in the pelvis. Musculoskeletal: No aggressive appearing focal osseous lesions. IMPRESSION: 1. Complex grade 4 Parks classification transsphincteric perianal fistula originating between 5 and 6 o'clock in the posterior anal wall, with associated small posterior intersphincteric space gas-containing abscess and bilateral pus-filled cutaneous fistula tracts originating from the ischiorectal fossa extending to the right perineum and medial left gluteal fold. 2. Mildly enlarged prostate due to mild nodular hypertrophy of the median lobe of the prostate. Electronically Signed   By: Ilona Sorrel M.D.   On: 05/20/2016 08:26   Dg Chest Port 1 View  Result Date: 05/20/2016 CLINICAL DATA:  Pt c/o being faint and SOB x 2 days. Hx pneumonia. Former smoker EXAM: PORTABLE CHEST 1 VIEW COMPARISON:  None. FINDINGS:  Normal mediastinum and cardiac silhouette. Normal pulmonary vasculature. No evidence of effusion, infiltrate, or pneumothorax. No acute bony abnormality. IMPRESSION: No acute cardiopulmonary process. Electronically Signed   By: Suzy Bouchard M.D.   On: 05/20/2016 18:49      Subjective: - no chest pain, shortness of breath, no abdominal pain, nausea or vomiting.   Discharge Exam: Vitals:   05/24/16 0431 05/24/16 0810  BP: 123/76 129/81  Pulse: (!) 102 97  Resp: 20 19  Temp: 99.4 F (37.4 C) 97.8 F (36.6 C)   Vitals:   05/23/16 1300 05/23/16 1943 05/24/16 0431 05/24/16 0810  BP: 101/67 136/70 123/76 129/81  Pulse: 94 96 (!) 102 97  Resp: 20 18 20 19   Temp:  97.3 F (36.3 C) 99.4 F (37.4 C) 97.8 F (36.6 C)  TempSrc:  Oral Oral Oral  SpO2:  98% 96% 95%  Weight:   97.9 kg (215 lb 12.8 oz)   Height:        General: Pt is alert, awake, not in acute distress Cardiovascular: RRR, S1/S2 +, no rubs, no gallops Respiratory: CTA bilaterally, no wheezing, no rhonchi Abdominal: Soft, NT, ND, bowel sounds + Extremities: no edema, no cyanosis    The results of significant diagnostics from this hospitalization (including imaging, microbiology, ancillary and laboratory) are listed below for reference.  Microbiology: Recent Results (from the past 240 hour(s))  Clostridium Difficile by PCR     Status: None   Collection Time: 05/19/16 11:57 AM  Result Value Ref Range Status   Toxigenic C Difficile by pcr Not Detected Not Detected Final    Comment: This test is for use only with liquid or soft stools; performance characteristics of other clinical specimen types have not been established.   This assay was performed by Cepheid GeneXpert(R) PCR. The performance characteristics of this assay have been determined by [employer]. Performance characteristics refer to the analytical performance of the test.   Blood Culture (routine x 2)     Status: None (Preliminary  result)   Collection Time: 05/20/16  6:07 PM  Result Value Ref Range Status   Specimen Description BLOOD RIGHT ANTECUBITAL  Final   Special Requests BOTTLES DRAWN AEROBIC AND ANAEROBIC 5CC  Final   Culture NO GROWTH 3 DAYS  Final   Report Status PENDING  Incomplete  Blood Culture (routine x 2)     Status: None (Preliminary result)   Collection Time: 05/20/16  6:20 PM  Result Value Ref Range Status   Specimen Description BLOOD LEFT HAND  Final   Special Requests IN PEDIATRIC BOTTLE 1CC  Final   Culture NO GROWTH 3 DAYS  Final   Report Status PENDING  Incomplete  Urine culture     Status: None   Collection Time: 05/20/16  9:34 PM  Result Value Ref Range Status   Specimen Description URINE, RANDOM  Final   Special Requests NONE  Final   Culture NO GROWTH  Final   Report Status 05/22/2016 FINAL  Final  MRSA PCR Screening     Status: None   Collection Time: 05/20/16 11:12 PM  Result Value Ref Range Status   MRSA by PCR NEGATIVE NEGATIVE Final    Comment:        The GeneXpert MRSA Assay (FDA approved for NASAL specimens only), is one component of a comprehensive MRSA colonization surveillance program. It is not intended to diagnose MRSA infection nor to guide or monitor treatment for MRSA infections.      Labs: BNP (last 3 results)  Recent Labs  05/20/16 1807  BNP 784.6*   Basic Metabolic Panel:  Recent Labs Lab 05/18/16 1150 05/20/16 1807 05/20/16 1816 05/21/16 0226 05/22/16 0401 05/23/16 0153  NA 137 137 140 138 139 138  K 4.2 3.4* 3.4* 3.9 4.3 3.7  CL 103 104 105 108 106 103  CO2 26 20*  --  21* 26 25  GLUCOSE 104* 162* 158* 111* 112* 114*  BUN 14 11 11 10 9 6   CREATININE 1.05 1.33* 1.20 1.15 1.11 1.14  CALCIUM 9.1 8.9  --  8.3* 8.5* 8.5*   Liver Function Tests:  Recent Labs Lab 05/18/16 1150 05/20/16 1807  AST 23 29  ALT 47 37  ALKPHOS 39 42  BILITOT 0.6 0.7  PROT 7.3 7.4  ALBUMIN 3.2* 3.1*   No results for input(s): LIPASE, AMYLASE in the  last 168 hours. No results for input(s): AMMONIA in the last 168 hours. CBC:  Recent Labs Lab 05/18/16 1150 05/20/16 1807 05/20/16 1816 05/21/16 0226 05/22/16 0401 05/23/16 0153 05/24/16 0253  WBC 8.4 12.6*  --  9.3 9.6 10.2 13.5*  NEUTROABS 6.0 9.9*  --   --   --   --   --   HGB 11.0* 12.0* 12.9* 10.0* 9.8* 10.3* 11.0*  HCT 33.1* 36.6* 38.0* 31.0* 30.2* 31.8*  34.4*  MCV 89.3 90.1  --  89.9 90.4 89.1 89.4  PLT 281.0 254  --  221 249 266 345   Cardiac Enzymes: No results for input(s): CKTOTAL, CKMB, CKMBINDEX, TROPONINI in the last 168 hours. BNP: Invalid input(s): POCBNP CBG:  Recent Labs Lab 05/20/16 2309  GLUCAP 119*   D-Dimer No results for input(s): DDIMER in the last 72 hours. Hgb A1c No results for input(s): HGBA1C in the last 72 hours. Lipid Profile No results for input(s): CHOL, HDL, LDLCALC, TRIG, CHOLHDL, LDLDIRECT in the last 72 hours. Thyroid function studies No results for input(s): TSH, T4TOTAL, T3FREE, THYROIDAB in the last 72 hours.  Invalid input(s): FREET3 Anemia work up No results for input(s): VITAMINB12, FOLATE, FERRITIN, TIBC, IRON, RETICCTPCT in the last 72 hours. Urinalysis    Component Value Date/Time   COLORURINE YELLOW 05/20/2016 2127   APPEARANCEUR CLEAR 05/20/2016 2127   LABSPEC 1.038 (H) 05/20/2016 2127   PHURINE 6.0 05/20/2016 2127   GLUCOSEU NEGATIVE 05/20/2016 2127   HGBUR NEGATIVE 05/20/2016 2127   BILIRUBINUR NEGATIVE 05/20/2016 2127   KETONESUR 15 (A) 05/20/2016 2127   PROTEINUR 30 (A) 05/20/2016 2127   NITRITE NEGATIVE 05/20/2016 2127   LEUKOCYTESUR NEGATIVE 05/20/2016 2127   Sepsis Labs Invalid input(s): PROCALCITONIN,  WBC,  LACTICIDVEN Microbiology Recent Results (from the past 240 hour(s))  Clostridium Difficile by PCR     Status: None   Collection Time: 05/19/16 11:57 AM  Result Value Ref Range Status   Toxigenic C Difficile by pcr Not Detected Not Detected Final    Comment: This test is for use only with  liquid or soft stools; performance characteristics of other clinical specimen types have not been established.   This assay was performed by Cepheid GeneXpert(R) PCR. The performance characteristics of this assay have been determined by [employer]. Performance characteristics refer to the analytical performance of the test.   Blood Culture (routine x 2)     Status: None (Preliminary result)   Collection Time: 05/20/16  6:07 PM  Result Value Ref Range Status   Specimen Description BLOOD RIGHT ANTECUBITAL  Final   Special Requests BOTTLES DRAWN AEROBIC AND ANAEROBIC 5CC  Final   Culture NO GROWTH 3 DAYS  Final   Report Status PENDING  Incomplete  Blood Culture (routine x 2)     Status: None (Preliminary result)   Collection Time: 05/20/16  6:20 PM  Result Value Ref Range Status   Specimen Description BLOOD LEFT HAND  Final   Special Requests IN PEDIATRIC BOTTLE 1CC  Final   Culture NO GROWTH 3 DAYS  Final   Report Status PENDING  Incomplete  Urine culture     Status: None   Collection Time: 05/20/16  9:34 PM  Result Value Ref Range Status   Specimen Description URINE, RANDOM  Final   Special Requests NONE  Final   Culture NO GROWTH  Final   Report Status 05/22/2016 FINAL  Final  MRSA PCR Screening     Status: None   Collection Time: 05/20/16 11:12 PM  Result Value Ref Range Status   MRSA by PCR NEGATIVE NEGATIVE Final    Comment:        The GeneXpert MRSA Assay (FDA approved for NASAL specimens only), is one component of a comprehensive MRSA colonization surveillance program. It is not intended to diagnose MRSA infection nor to guide or monitor treatment for MRSA infections.      Time coordinating discharge: Over 30 minutes  SIGNED:  Marzetta Board, English  [facility] 05/24/2016, 3:40 PM Pager [phone]  If 7PM-7AM, please contact night-coverage [email or web address] Password [email or web address]

## 2016-05-24 NOTE — Progress Notes (Addendum)
Received call from discharging MD- post discharge regarding pt's Eliquis- MD had received notice that pt needed pre-auth for drug from [facility]- call made to pharmacy- spoke with Gerald Stabs- [phone]- per pharmacy they have filled the Eliquis for pt today and pt has picked up- pt paid $10 for drug using a copay assist card for Eliquis- (per insurance check pt was to have a zero dollar copay) have called and spoken to Eliquis rep. Regarding the disconnect with info received and what the pharmacy told pt.  Call also made to pt who will f/u with his drug coverage provider to verify coverage.

## 2016-05-25 LAB — CULTURE, BLOOD (ROUTINE X 2)
CULTURE: NO GROWTH
Culture: NO GROWTH

## 2016-05-30 ENCOUNTER — Ambulatory Visit (INDEPENDENT_AMBULATORY_CARE_PROVIDER_SITE_OTHER): Payer: 59 | Admitting: Gastroenterology

## 2016-05-30 ENCOUNTER — Other Ambulatory Visit (INDEPENDENT_AMBULATORY_CARE_PROVIDER_SITE_OTHER): Payer: 59

## 2016-05-30 ENCOUNTER — Encounter: Payer: Self-pay | Admitting: Gastroenterology

## 2016-05-30 VITALS — BP 116/74 | HR 88 | Ht 71.0 in | Wt 208.4 lb

## 2016-05-30 DIAGNOSIS — K50113 Crohn's disease of large intestine with fistula: Secondary | ICD-10-CM

## 2016-05-30 LAB — CBC WITH DIFFERENTIAL/PLATELET
BASOS ABS: 0 10*3/uL (ref 0.0–0.1)
Basophils Relative: 0.5 % (ref 0.0–3.0)
EOS ABS: 0.5 10*3/uL (ref 0.0–0.7)
Eosinophils Relative: 5.7 % — ABNORMAL HIGH (ref 0.0–5.0)
HEMATOCRIT: 31.4 % — AB (ref 39.0–52.0)
HEMOGLOBIN: 10.3 g/dL — AB (ref 13.0–17.0)
LYMPHS PCT: 18.2 % (ref 12.0–46.0)
Lymphs Abs: 1.5 10*3/uL (ref 0.7–4.0)
MCHC: 32.9 g/dL (ref 30.0–36.0)
MCV: 87.6 fl (ref 78.0–100.0)
MONOS PCT: 12.2 % — AB (ref 3.0–12.0)
Monocytes Absolute: 1 10*3/uL (ref 0.1–1.0)
NEUTROS ABS: 5.4 10*3/uL (ref 1.4–7.7)
Neutrophils Relative %: 63.4 % (ref 43.0–77.0)
Platelets: 434 10*3/uL — ABNORMAL HIGH (ref 150.0–400.0)
RBC: 3.58 Mil/uL — ABNORMAL LOW (ref 4.22–5.81)
RDW: 15 % (ref 11.5–15.5)
WBC: 8.5 10*3/uL (ref 4.0–10.5)

## 2016-05-30 LAB — COMPREHENSIVE METABOLIC PANEL
ALK PHOS: 49 U/L (ref 39–117)
ALT: 59 U/L — AB (ref 0–53)
AST: 21 U/L (ref 0–37)
Albumin: 3.3 g/dL — ABNORMAL LOW (ref 3.5–5.2)
BILIRUBIN TOTAL: 0.4 mg/dL (ref 0.2–1.2)
BUN: 16 mg/dL (ref 6–23)
CO2: 27 meq/L (ref 19–32)
CREATININE: 1.01 mg/dL (ref 0.40–1.50)
Calcium: 8.8 mg/dL (ref 8.4–10.5)
Chloride: 105 mEq/L (ref 96–112)
GFR: 80.68 mL/min (ref >60.00)
GLUCOSE: 97 mg/dL (ref 70–99)
Potassium: 4.3 mEq/L (ref 3.5–5.1)
Sodium: 139 mEq/L (ref 135–145)
TOTAL PROTEIN: 7.6 g/dL (ref 6.0–8.3)

## 2016-05-30 LAB — HIGH SENSITIVITY CRP: CRP, High Sensitivity: 42.61 mg/L — ABNORMAL HIGH (ref 0.000–5.000)

## 2016-05-30 MED ORDER — TINIDAZOLE 500 MG PO TABS: 500.0000 mg | ORAL_TABLET | Freq: Every day | ORAL | 0 refills | Status: DC

## 2016-05-30 NOTE — Progress Notes (Addendum)
Jesse English    [id number]    [birth year]  Primary Care Physician:Shaw, Gwyndolyn Saxon, MD  Referring Physician: Marton Redwood, MD [address] [place], [place] [address]  Chief complaint:  Anorectal abscess with fistula  HPI:  55 year old male with left-sided colitis was diagnosed in March 2017 presumed to be ulcerative colitis in the setting of C. difficile colitis and subsequent flare in the setting of recurrent c.diff colitis in April 2017 and July 2017 treated with prolonged Vancomycin taper. He was last seen in office on 05/18/2016  is here for follow up visit, accompanied by his wife. He developed an abscess last month that was drained subsequent CT abd & pelvis showed persistent peri rectal abscess and was noted to have Fournier gangrene requiring surgical drainage. On 05/20/2016 he was admitted with shortness of breath noted to have saddle pulmonary embolus started on Eliquis with improvement of shortness of breath and right heart strain. He reports improvement of the anorectal wound with decreased drainage. He is currently having about 2-3 semi-formed bowel movements daily with no blood. No fever or chills.    Outpatient Encounter Prescriptions as of 05/30/2016  Medication Sig  . alfuzosin (UROXATRAL) 10 MG 24 hr tablet Take 10 mg by mouth daily with breakfast.  . amLODipine (NORVASC) 5 MG tablet Take 2.5 mg by mouth daily. Pt takes 2.5mg  daily  . apixaban (ELIQUIS) 5 MG TABS tablet Take 2 tablets (10 mg total) by mouth 2 (two) times daily. For 5 more days, on 05/29/16 start taking 5 mg twice daily  . colchicine 0.6 MG tablet Take 0.6 mg by mouth every 8 (eight) hours as needed (gout).  Marland Kitchen LIALDA 1.2 g EC tablet Take 1.2 g by mouth 2 (two) times daily.   . Probiotic Product (VSL#3) CAPS Take 1 capsule by mouth daily. Begin after 2nd week of Vancomycin  . tinidazole (TINDAMAX) 500 MG tablet Take 1 tablet (500 mg total) by mouth daily with breakfast. For 4 weeks  .  [DISCONTINUED] tinidazole (TINDAMAX) 500 MG tablet Take 1 tablet (500 mg total) by mouth daily with breakfast. For 4 weeks  . [DISCONTINUED] mesalamine (PENTASA) 500 MG CR capsule Take 2 capsules (1,000 mg total) by mouth 4 (four) times daily. (Patient taking differently: Take 1,000 mg by mouth daily. )   No facility-administered encounter medications on file as of 05/30/2016.     Allergies as of 05/30/2016 - Review Complete 05/30/2016  Allergen Reaction Noted  . Flomax [tamsulosin] Other (See Comments) 04/22/2016  . Ace inhibitors Swelling 09/23/2015    Past Medical History:  Diagnosis Date  . Colon polyps    2013  . Crohn's colitis ([place])    vs UC with abscess  . Diverticulosis   . Fournier's gangrene in male 04/2016  . Gout   . Hypertension   . Perirectal abscess 04/2016  . Prolapsed internal hemorrhoid, grade 4   . Pulmonary embolism ([place])   . Sleep apnea    cpap    Past Surgical History:  Procedure Laterality Date  . ANAL FISSURE REPAIR    . COLONOSCOPY    . HEMORRHOID SURGERY  04/21/2016   Procedure: HEMORRHOIDECTOMY;  Surgeon: Michael Boston, MD;  Location: [facility];  Service: General;;  . INCISION AND DRAINAGE ABSCESS N/A 04/21/2016   Procedure: INCISION AND DRAINAGE OF HORSESHOE  FOURNIER GANGRENE PERIRECTAL ABSCESS;  Surgeon: Michael Boston, MD;  Location: [facility];  Service: General;  Laterality: N/A;  Family History  Problem Relation Age of Onset  . Colon cancer Neg Hx   . Esophageal cancer Neg Hx   . Stomach cancer Neg Hx   . Rectal cancer Neg Hx     Social History   Social History  . Marital status: Single    Spouse name: N/A  . Number of children: 2  . Years of education: N/A   Occupational History  . sales    Social History Main Topics  . Smoking status: Former Smoker    Types: Cigars    Quit date: 07/10/2013  . Smokeless tobacco: Never Used  . Alcohol use No     Comment: 2 per day  . Drug use: No  . Sexual activity: Not on file   Other  Topics Concern  . Not on file   Social History Narrative  . No narrative on file      Review of systems: Review of Systems  Constitutional: Negative for fever and chills.  HENT: Negative.   Eyes: Negative for blurred vision.  Respiratory: Negative for cough, shortness of breath and wheezing.   Cardiovascular: Negative for chest pain and palpitations.  Gastrointestinal: as per HPI Genitourinary: Negative for dysuria, urgency, frequency and hematuria.  Musculoskeletal: Negative for myalgias, back pain and joint pain.  Skin: Negative for itching and rash.  Neurological: Negative for dizziness, tremors, focal weakness, seizures and loss of consciousness.  Endo/Heme/Allergies: Positive for seasonal allergies.  Psychiatric/Behavioral: Negative for depression, suicidal ideas and hallucinations.  All other systems reviewed and are negative.   Physical Exam: Vitals:   05/30/16 1613  BP: 116/74  Pulse: 88   Body mass index is 29.06 kg/m. Gen:      No acute distress HEENT:  EOMI, sclera anicteric Neck:     No masses; no thyromegaly Lungs:    Clear to auscultation bilaterally; normal respiratory effort CV:         Regular rate and rhythm; no murmurs Abd:      + bowel sounds; soft, non-tender; no palpable masses, no distension Ext:    No edema; adequate peripheral perfusion Skin:      Warm and dry; no rash Neuro: alert and oriented x 3 Psych: normal mood and affect Rectal exam: Perirectal wound appears to be healing well with good skin granulation tissue, no drainage, ano rectal fistula, no induration  Data Reviewed:  Reviewed labs, radiology imaging, old records and pertinent past GI work up  MRI pelvis with contrast 05/19/16 1. Complex grade 4 Parks classification transsphincteric perianal fistula originating between 5 and 6 o'clock in the posterior anal wall, with associated small posterior intersphincteric space gas-containing abscess and bilateral pus-filled cutaneous  fistula tracts originating from the ischiorectal fossa extending to the right perineum and medial left gluteal fold. 2. Mildly enlarged prostate due to mild nodular hypertrophy of the median lobe of the prostate.  Assessment and Plan/Recommendations:  55 year old male with left-sided colitis inflammatory bowel disease complicated by anorectal abscess and fistula here for follow-up visit Patient was admitted 10 days ago with saddle pulmonary embolus, started on Eliquis with improvement Continue tinidazole until the anorectal fistula heals Scheduled for Entyvio induction starting next week Follow-up CBC, CMP and CRP return in 2 weeks or sooner if needed 25 minutes was spent face-to-face with the patient. Greater than 50% of the time used for counseling as well as treatment plan and follow-up.   Damaris Hippo , MD [phone] Mon-Fri 8a-5p [phone] after 5p, weekends, holidays  CC: Brigitte Pulse,  Gwyndolyn Saxon, MD

## 2016-05-30 NOTE — Patient Instructions (Signed)
Go to the basement for labs  We have sent your prescription to your pharmacy

## 2016-06-07 ENCOUNTER — Encounter: Payer: Self-pay | Admitting: Adult Health

## 2016-06-07 ENCOUNTER — Other Ambulatory Visit: Payer: Self-pay

## 2016-06-07 ENCOUNTER — Ambulatory Visit (INDEPENDENT_AMBULATORY_CARE_PROVIDER_SITE_OTHER): Payer: 59 | Admitting: Adult Health

## 2016-06-07 ENCOUNTER — Ambulatory Visit: Payer: 59 | Admitting: Gastroenterology

## 2016-06-07 VITALS — BP 126/74 | HR 97 | Temp 97.6°F | Ht 71.0 in | Wt 213.4 lb

## 2016-06-07 DIAGNOSIS — R911 Solitary pulmonary nodule: Secondary | ICD-10-CM | POA: Diagnosis not present

## 2016-06-07 DIAGNOSIS — I2601 Septic pulmonary embolism with acute cor pulmonale: Secondary | ICD-10-CM | POA: Diagnosis not present

## 2016-06-07 DIAGNOSIS — I2699 Other pulmonary embolism without acute cor pulmonale: Secondary | ICD-10-CM | POA: Diagnosis not present

## 2016-06-07 DIAGNOSIS — I2782 Chronic pulmonary embolism: Secondary | ICD-10-CM

## 2016-06-07 DIAGNOSIS — K50119 Crohn's disease of large intestine with unspecified complications: Secondary | ICD-10-CM

## 2016-06-07 MED ORDER — APIXABAN 5 MG PO TABS: 5.0000 mg | ORAL_TABLET | Freq: Two times a day (BID) | ORAL | 1 refills | Status: DC

## 2016-06-07 MED ORDER — APIXABAN 5 MG PO TABS: 10.0000 mg | ORAL_TABLET | Freq: Two times a day (BID) | ORAL | 1 refills | Status: DC

## 2016-06-07 NOTE — Assessment & Plan Note (Signed)
Provoked Submassive PE with bilateral DVT (after prolonged immobility d/t rectal abscess) doing well on Eliquis .  Pt education on anticoagulation given  He will need follow up echo in 3 months due to right heart strain noted on initial echo.  Will also need follow up CT chest in 3 months to follow 70mm LLL opacity vs atx and RLL 7 mm density   Plan  Patient Instructions  Continue on Eliquis 5mg  Twice daily  .  We are setting you up for CT chest and Echo in  3 months .  Do not take any type of NSAIDS -advil, aleve, excedrin , etc .  Advance activity as tolerated.  Follow up Dr. Lake Bells in 4 weeks and 3 months and As needed   Please contact office for sooner follow up if symptoms do not improve or worsen or seek emergency care

## 2016-06-07 NOTE — Patient Instructions (Addendum)
Continue on Eliquis 36m Twice daily  .  We are setting you up for CT chest and Echo in  3 months .  Do not take any type of NSAIDS -advil, aleve, excedrin , etc .  Advance activity as tolerated.  Follow up Dr. MLake Bellsin 4 weeks and 3 months and As needed   Please contact office for sooner follow up if symptoms do not improve or worsen or seek emergency care

## 2016-06-07 NOTE — Progress Notes (Signed)
Reviewed, agree with this plan of care 

## 2016-06-07 NOTE — Progress Notes (Signed)
Subjective:    Patient ID: Jesse English, male    DOB: 04-22-1961, 55 y.o.   MRN: [id number]  HPI 55 year old male former smoker with past medical history of Crohn's disease and multiple colonic and rectal fistula. Seen for pulmonary consult during admission on 05/20/2016 for submassive PE and bilateral DVT   TEST  11/11 CTA Chest >>  acute saddle pulmonary embolus extending into the lobar, segmental and subsegmental arteries bilaterally of the lobes of both lungs. Right heart strain is noted with RV to LV ratio of 1.85. The heart is top normal without pericardial effusion. There is coronary arteriosclerosis. No thoracic aortic aneurysm or dissection. No enlarged mediastinal, hilar, or axillary lymph nodes.  There is an approximately 7.3 mm in average calcification in the left lower lobe consistent with a granuloma. Also within the left lower lobe is a new small ovoid 28 x 9 mm pleural-based opacity possibly representing a focus of rounded atelectasis. There is a similar new 7 mm pleural-based density in the right lower lobe. Trace left effusion and/or pleural thickening. 2-D echo 05/22/2016 showed normal left ventricular systolic and diastolic function, moderately dilated right ventricle with moderately decreased systolic function. Venous Dopplers November 13.2017 acute DVT in the right lower extremity from the proximal femoral vein throughout the calf veins. An acute DVT involving the left popliteal vein extending into the calf veins  06/07/2016 Post Hospital follow up : PE/DVT Patient presents for a two-week follow-up from recent hospitalization for submassive PE and bilateral DVTs. Her to admission. Patient was immobilized for about a month due to rectal abscess and fistula. [name] has significant shortness of breath with a near syncopal episode prior to admission. On arrival to the emergency room he was found to have bilateral PE with evidence of right heart strain. Venous  Dopplers  revealed bilateral DVTs. 2-D echo showed moderately dilated right ventricle with  moderately decreased systolic function. He was treated with Hep/Eliquis . Since discharge doing well with less dyspnea. No dizziness, syncope, chest pain , orthopnea or bleeding .  Hospital records [place] summary reviewed . No personal or FH of VTE .    Past Medical History:  Diagnosis Date  . Colon polyps    2013  . Crohn's colitis ([place])    vs UC with abscess  . Diverticulosis   . Fournier's gangrene in male 04/2016  . Gout   . Hypertension   . Perirectal abscess 04/2016  . Prolapsed internal hemorrhoid, grade 4   . Pulmonary embolism ([place])   . Sleep apnea    cpap   Current Outpatient Prescriptions on File Prior to Visit  Medication Sig Dispense Refill  . alfuzosin (UROXATRAL) 10 MG 24 hr tablet Take 10 mg by mouth daily with breakfast.    . amLODipine (NORVASC) 5 MG tablet Take 2.5 mg by mouth daily. Pt takes 2.5mg  daily    . apixaban (ELIQUIS) 5 MG TABS tablet Take 2 tablets (10 mg total) by mouth 2 (two) times daily. For 5 more days, on 05/29/16 start taking 5 mg twice daily 60 tablet 1  . colchicine 0.6 MG tablet Take 0.6 mg by mouth every 8 (eight) hours as needed (gout).    Marland Kitchen LIALDA 1.2 g EC tablet Take 1.2 g by mouth 2 (two) times daily.     . Probiotic Product (VSL#3) CAPS Take 1 capsule by mouth daily. Begin after 2nd week of Vancomycin 60 capsule 3  . tinidazole (TINDAMAX) 500 MG tablet Take 1 tablet (500  mg total) by mouth daily with breakfast. For 4 weeks 30 tablet 0   No current facility-administered medications on file prior to visit.       Review of Systems Constitutional:   No  weight loss, night sweats,  Fevers, chills, fatigue, or  lassitude.  HEENT:   No headaches,  Difficulty swallowing,  Tooth/dental problems, or  Sore throat,                No sneezing, itching, ear ache, nasal congestion, post nasal drip,   CV:  No chest pain,  Orthopnea, PND, swelling in lower  extremities, anasarca, dizziness, palpitations, syncope.   GI  No heartburn, indigestion, abdominal pain, nausea, vomiting, diarrhea, change in bowel habits, loss of appetite, bloody stools.   Resp: No shortness of breath with exertion or at rest.  No excess mucus, no productive cough,  No non-productive cough,  No coughing up of blood.  No change in color of mucus.  No wheezing.  No chest wall deformity  Skin: no rash or lesions.  GU: no dysuria, change in color of urine, no urgency or frequency.  No flank pain, no hematuria   MS:  No joint pain or swelling.  No decreased range of motion.  No back pain.  Psych:  No change in mood or affect. No depression or anxiety.  No memory loss.         Objective:   Physical Exam Vitals:   06/07/16 0909  BP: 126/74  Pulse: 97  Temp: 97.6 F (36.4 C)  TempSrc: Oral  SpO2: 98%  Weight: 213 lb 6.4 oz (96.8 kg)  Height: 5\' 11"  (1.803 m)   GEN: A/Ox3; pleasant , NAD, well nourished    HEENT:  [place]/AT,  EACs-clear, TMs-wnl, NOSE-clear, THROAT-clear, no lesions, no postnasal drip or exudate noted.   NECK:  Supple w/ fair ROM; no JVD; normal carotid impulses w/o bruits; no thyromegaly or nodules palpated; no lymphadenopathy.    RESP  Clear  P & A; w/o, wheezes/ rales/ or rhonchi. no accessory muscle use, no dullness to percussion  CARD:  RRR, no m/r/g  , tr R>L  peripheral edema, pulses intact, no cyanosis or clubbing.  GI:   Soft & nt; nml bowel sounds; no organomegaly or masses detected.   Musco: Warm bil, no deformities or joint swelling noted.   Neuro: alert, no focal deficits noted.    Skin: Warm, no lesions or rashes  Jadan Hinojos NP-C  [facility]  06/07/2016        Assessment & Plan:

## 2016-06-09 NOTE — Addendum Note (Signed)
Addended by: Kyra Leyland E on: 06/09/2016 03:51 PM   Modules accepted: Orders

## 2016-06-12 ENCOUNTER — Encounter (HOSPITAL_COMMUNITY): Payer: Self-pay

## 2016-06-12 ENCOUNTER — Encounter (HOSPITAL_COMMUNITY)
Admit: 2016-06-12 | Discharge: 2016-06-12 | Disposition: A | Discharge status: Home / Self Care | Payer: 59 | Source: Ambulatory Visit | Attending: Gastroenterology | Admitting: Gastroenterology

## 2016-06-12 DIAGNOSIS — K50119 Crohn's disease of large intestine with unspecified complications: Secondary | ICD-10-CM | POA: Diagnosis not present

## 2016-06-12 MED ORDER — SODIUM CHLORIDE 0.9 % IV SOLN
INTRAVENOUS | Status: DC
  Administered 2016-06-12: 11:00:00 via INTRAVENOUS

## 2016-06-12 MED ORDER — SODIUM CHLORIDE 0.9 % IV SOLN
300.0000 mg | Freq: Once | INTRAVENOUS | Status: AC
  Administered 2016-06-12: 300 mg via INTRAVENOUS
  Filled 2016-06-12: qty 5

## 2016-06-12 NOTE — Progress Notes (Signed)
Assessed 1100 12/4

## 2016-06-13 ENCOUNTER — Other Ambulatory Visit (INDEPENDENT_AMBULATORY_CARE_PROVIDER_SITE_OTHER): Payer: 59

## 2016-06-13 ENCOUNTER — Encounter: Payer: Self-pay | Admitting: Gastroenterology

## 2016-06-13 ENCOUNTER — Ambulatory Visit (INDEPENDENT_AMBULATORY_CARE_PROVIDER_SITE_OTHER): Payer: 59 | Admitting: Gastroenterology

## 2016-06-13 VITALS — BP 120/70 | HR 88 | Ht 68.5 in | Wt 216.0 lb

## 2016-06-13 DIAGNOSIS — K50113 Crohn's disease of large intestine with fistula: Secondary | ICD-10-CM

## 2016-06-13 LAB — HIGH SENSITIVITY CRP: CRP, High Sensitivity: 1.54 mg/L (ref 0.000–5.000)

## 2016-06-13 MED ORDER — TINIDAZOLE 500 MG PO TABS: 500.0000 mg | ORAL_TABLET | Freq: Every day | ORAL | 3 refills | Status: DC

## 2016-06-13 NOTE — Patient Instructions (Addendum)
We have sent your prescription to your pharmacy  Your follow up appointment with Dr Silverio Decamp is Jan 10th 10:45 am  Go to the basement for labs today

## 2016-06-13 NOTE — Progress Notes (Signed)
Jesse English    [id number]    [birth year]  Primary Care Physician:Shaw, Gwyndolyn Saxon, MD  Referring Physician: Marton Redwood, MD [address] [place], [place] [address]  Chief complaint:  Crohn's colitis with fistula  HPI: Patient feels much better, decreased drainage from anorectal fistula but still has a small area, received his first induction dose of Entyvio yesterday. Denies any blood in stool other than drainage from the fistula and is having 2-3 soft bowel movements.  Pertinent GI past history 55 year old male with left-sided colitis was diagnosed in March 2017 presumed to be ulcerative colitis in the setting of C. difficile colitis and subsequent flare in the setting of recurrent c.diff colitis in April 2017 and July 2017 treated with prolonged Vancomycin taper. He was last seen in office on 05/18/2016 is here for follow up visit, accompanied by his wife. He developed an abscess last month that was drained subsequent CT abd &pelvis showed persistent peri rectal abscess and was noted to have Fournier gangrene requiring surgical drainage. On 05/20/2016 he was admitted with shortness of breath noted to have saddle pulmonary embolus started on Eliquis with improvement of shortness of breath and right heart strain.   Outpatient Encounter Prescriptions as of 06/13/2016  Medication Sig  . alfuzosin (UROXATRAL) 10 MG 24 hr tablet Take 10 mg by mouth daily with breakfast.  . amLODipine (NORVASC) 5 MG tablet Take 2.5 mg by mouth daily. Pt takes 2.30m daily  . apixaban (ELIQUIS) 5 MG TABS tablet Take 5 mg by mouth 2 (two) times daily.  . colchicine 0.6 MG tablet Take 0.6 mg by mouth every 8 (eight) hours as needed (gout).  .Marland KitchenLIALDA 1.2 g EC tablet Take 1.2 g by mouth 2 (two) times daily.   . Probiotic Product (VSL#3) CAPS Take 1 capsule by mouth daily. Begin after 2nd week of Vancomycin  . sodium chloride 0.9 % SOLN 250 mL with vedolizumab 300 MG SOLR 300 mg Inject 300 mg into the  vein as directed.  . tinidazole (TINDAMAX) 500 MG tablet Take 1 tablet (500 mg total) by mouth daily with breakfast. For 4 weeks  . [DISCONTINUED] tinidazole (TINDAMAX) 500 MG tablet Take 1 tablet (500 mg total) by mouth daily with breakfast. For 4 weeks   No facility-administered encounter medications on file as of 06/13/2016.     Allergies as of 06/13/2016 - Review Complete 06/13/2016  Allergen Reaction Noted  . Ace inhibitors Swelling 09/23/2015    Past Medical History:  Diagnosis Date  . Colon polyps    2013  [place]    [place]  . Diverticulosis   . Fournier's gangrene in male 04/2016  . Gout   . Hypertension   . Perirectal abscess 04/2016  . Prolapsed internal hemorrhoid, grade 4   . [place]monary embolism ([place]   . Sleep apnea    cpap    Past Surgical History:  Procedure Laterality Date  . ANAL FISSURE REPAIR    . COLONOSCOPY    . HEMORRHOID SURGERY  04/21/2016   Procedure: HEMORRHOIDECTOMY;  Surgeon: SMichael Boston MD;  Location: [facility];  Service: General;;  . INCISION AND DRAINAGE ABSCESS N/A 04/21/2016   Procedure: INCISION AND DRAINAGE OF HORSESHOE  FOURNIER GANGRENE PERIRECTAL ABSCESS;  Surgeon: SMichael Boston MD;  Location: [facility];  Service: General;  Laterality: N/A;    Family History  Problem Relation Age of Onset  . Colon cancer Neg Hx   .  Esophageal cancer Neg Hx   . Stomach cancer Neg Hx   . Rectal cancer Neg Hx     Social History   Social History  . Marital status: Single    Spouse name: N/A  . Number of children: 2  . Years of education: N/A   Occupational History  . sales    Social History Main Topics  . Smoking status: Former Smoker    Types: Cigars    Quit date: 07/10/2013  . Smokeless tobacco: Never Used  . Alcohol use No     Comment: 2 per day  . Drug use: No  . Sexual activity: Not on file   Other Topics Concern  . Not on file   Social History Narrative  . No narrative on file      Review of  systems: Review of Systems  Constitutional: Negative for fever and chills.  HENT: Negative.   Eyes: Negative for blurred vision.  Respiratory: Negative for cough, shortness of breath and wheezing.   Cardiovascular: Negative for chest pain and palpitations.  Gastrointestinal: as per HPI Genitourinary: Negative for dysuria, urgency, frequency and hematuria.  Musculoskeletal: Negative for myalgias, back pain and joint pain.  Skin: Negative for itching and rash.  Neurological: Negative for dizziness, tremors, focal weakness, seizures and loss of consciousness.  Endo/Heme/Allergies: Positive for seasonal allergies.  Psychiatric/Behavioral: Negative for depression, suicidal ideas and hallucinations.  All other systems reviewed and are negative.   Physical Exam: Vitals:   06/13/16 0948  BP: 120/70  Pulse: 88   Body mass index is 32.36 kg/m. Gen:      No acute distress HEENT:  EOMI, sclera anicteric Neck:     No masses; no thyromegaly Lungs:    Clear to auscultation bilaterally; normal respiratory effort CV:         Regular rate and rhythm; no murmurs Abd:      + bowel sounds; soft, non-tender; no palpable masses, no distension Ext:    No edema; adequate peripheral perfusion Skin:      Warm and dry; no rash Neuro: alert and oriented x 3 Psych: normal mood and affect Rectal exam: Normal anal sphincter tone, no anal fissure or external hemorrhoids, 1x2cm open wound with granulation perianal with fistula   Data Reviewed:  Reviewed labs, radiology imaging, old records and pertinent past GI work up   Assessment and Plan/Recommendations:  55 year old male with left-sided colitis inflammatory bowel disease complicated by anorectal abscess and fistula here for follow-up visit Continue Tinidazole 500 mg daily till the anorectal fistula is completely healing Received first Entyvio induction 06/12/16 Follow-up  CRP return in 4 weeks or sooner if needed  K. Denzil Magnuson , MD [phone]  Mon-Fri 8a-5p [phone] after 5p, weekends, holidays  CC: Marton Redwood, MD

## 2016-06-15 ENCOUNTER — Other Ambulatory Visit: Payer: Self-pay

## 2016-06-15 DIAGNOSIS — K50018 Crohn's disease of small intestine with other complication: Secondary | ICD-10-CM

## 2016-06-15 MED ORDER — VEDOLIZUMAB 300 MG IV SOLR: 300.0000 mg | INTRAVENOUS | 6 refills | Status: DC

## 2016-06-15 MED ORDER — VEDOLIZUMAB 300 MG IV SOLR: 300.0000 mg | INTRAVENOUS | Status: DC

## 2016-06-22 ENCOUNTER — Inpatient Hospital Stay: Payer: 59 | Admitting: Pulmonary Disease

## 2016-06-26 ENCOUNTER — Encounter (HOSPITAL_COMMUNITY)
Admission: RE | Admit: 2016-06-26 | Discharge: 2016-06-26 | Disposition: A | Discharge status: Home / Self Care | Payer: 59 | Source: Ambulatory Visit | Attending: Gastroenterology | Admitting: Gastroenterology

## 2016-06-26 ENCOUNTER — Encounter (HOSPITAL_COMMUNITY): Payer: Self-pay

## 2016-06-26 DIAGNOSIS — K50119 Crohn's disease of large intestine with unspecified complications: Secondary | ICD-10-CM | POA: Diagnosis not present

## 2016-06-26 DIAGNOSIS — K50018 Crohn's disease of small intestine with other complication: Secondary | ICD-10-CM

## 2016-06-26 MED ORDER — VEDOLIZUMAB 300 MG IV SOLR
300.0000 mg | Freq: Once | INTRAVENOUS | Status: AC
  Administered 2016-06-26: 300 mg via INTRAVENOUS
  Filled 2016-06-26: qty 5

## 2016-06-26 MED ORDER — SODIUM CHLORIDE 0.9 % IV SOLN
Freq: Once | INTRAVENOUS | Status: AC
  Administered 2016-06-26: 12:00:00 via INTRAVENOUS

## 2016-07-12 ENCOUNTER — Encounter: Payer: Self-pay | Admitting: Adult Health

## 2016-07-12 ENCOUNTER — Ambulatory Visit (INDEPENDENT_AMBULATORY_CARE_PROVIDER_SITE_OTHER): Payer: 59 | Admitting: Adult Health

## 2016-07-12 DIAGNOSIS — I824Z3 Acute embolism and thrombosis of unspecified deep veins of distal lower extremity, bilateral: Secondary | ICD-10-CM | POA: Diagnosis not present

## 2016-07-12 DIAGNOSIS — I82409 Acute embolism and thrombosis of unspecified deep veins of unspecified lower extremity: Secondary | ICD-10-CM | POA: Insufficient documentation

## 2016-07-12 DIAGNOSIS — I2699 Other pulmonary embolism without acute cor pulmonale: Secondary | ICD-10-CM | POA: Diagnosis not present

## 2016-07-12 DIAGNOSIS — R911 Solitary pulmonary nodule: Secondary | ICD-10-CM

## 2016-07-12 MED ORDER — APIXABAN 5 MG PO TABS: 5.0000 mg | ORAL_TABLET | Freq: Two times a day (BID) | ORAL | 5 refills | Status: DC

## 2016-07-12 NOTE — Patient Instructions (Addendum)
Continue on Eliquis 5mg  Twice daily  .  Follow up for CT chest and Echo as planned .  Do not take any type of NSAIDS -advil, aleve, excedrin , etc .  Advance activity as tolerated.  Follow up Dr. Lake Bells next month as planned  And As needed   Please contact office for sooner follow up if symptoms do not improve or worsen or seek emergency care

## 2016-07-12 NOTE — Assessment & Plan Note (Signed)
Provoked PE after illness /immobility  Continue on Eliquis ~ 6 month planned.  Will need venous doppler prior to d/c Check 2 D echo at 3 month interval to follow right heart strain.  Pt edcuation given   Plan  Patient Instructions  Continue on Eliquis 5mg  Twice daily  .  Follow up for CT chest and Echo as planned .  Do not take any type of NSAIDS -advil, aleve, excedrin , etc .  Advance activity as tolerated.  Follow up Dr. Lake Bells next month as planned  And As needed   Please contact office for sooner follow up if symptoms do not improve or worsen or seek emergency care

## 2016-07-12 NOTE — Assessment & Plan Note (Signed)
Will need venous doppler prior to d/c eliquis ~6 month mark.

## 2016-07-12 NOTE — Progress Notes (Signed)
@Patient  ID: Jesse English, male    DOB: 1961/07/07, 56 y.o.   MRN: [id number]  No chief complaint on file.   Referring provider: Marton Redwood, MD  HPI: 56 year old male former smoker with past medical history of Crohn's disease and multiple colonic and rectal fistula. Seen for pulmonary consult during admission on 05/20/2016 for submassive PE and bilateral DVT   TEST  11/11 CTA Chest >> acute saddle pulmonary embolus extending into the lobar, segmental and subsegmental arteries bilaterally of the lobes of both lungs. Right heart strain is noted with RV to LV ratio of 1.85. The heart is top normal without pericardial effusion. There is coronary arteriosclerosis. No thoracic aortic aneurysm or dissection. No enlarged mediastinal, hilar, or axillary lymph nodes.  There is an approximately 7.3 mm in average calcification in the left lower lobe consistent with a granuloma. Also within the left lower lobe is a new small ovoid 28 x 9 mm pleural-based opacity possibly representing a focus of rounded atelectasis. There is a similar new 7 mm pleural-based density in the right lower lobe. Trace left effusion and/or pleural thickening. 2-D echo 05/22/2016 showed normal left ventricular systolic and diastolic function, moderately dilated right ventricle with moderately decreased systolic function. Venous Dopplers November 13.2017 acute DVT in the right lower extremity from the proximal femoral vein throughout the calf veins. An acute DVT involving the left popliteal vein extending into the calf veins   07/12/2016 Follow up : PE/DVT  Pt returns for a 1 month follow up . He was dx with submassive PE/bilateral DVT felt provoked from illness/immobility . Echo showed right heart strain. He was started on Eliquis. Says he is doing better. Tolerating well with no known bleeding.  Dyspnea has resolved. Becoming more active., gone back to gym. Was able to walk on treadmill without trouble. Denies chest  pain, dyspnea, or syncope.   Has being dealing with gout flare, seen PCP started on steroids.   CT showed lung nodule /opacity in LLL  And RLL . Has a planned repeat CT chest at 3 month interval time.    Allergies  Allergen Reactions  . Ace Inhibitors Swelling    Facial swelling and small intestinal swelling.    Immunization History  Administered Date(s) Administered  . Influenza,inj,Quad PF,36+ Mos 05/18/2016  . PPD Test 05/12/2016  . Pneumococcal Polysaccharide-23 05/18/2016    Past Medical History:  Diagnosis Date  . Colon polyps    2013  . Crohn's colitis ([place])    vs UC with abscess  . Diverticulosis   . Fournier's gangrene in male 04/2016  . Gout   . Hypertension   . Perirectal abscess 04/2016  . Prolapsed internal hemorrhoid, grade 4   . Pulmonary embolism ([place])   . Sleep apnea    cpap    Tobacco History: History  Smoking Status  . Former Smoker  . Types: Cigars  . Quit date: 07/10/2013  Smokeless Tobacco  . Never Used   Counseling given: Not Answered   Outpatient Encounter Prescriptions as of 07/12/2016  Medication Sig  . alfuzosin (UROXATRAL) 10 MG 24 hr tablet Take 10 mg by mouth daily with breakfast.  . amLODipine (NORVASC) 5 MG tablet Take 2.5 mg by mouth daily. Pt takes 2.35m daily  . apixaban (ELIQUIS) 5 MG TABS tablet Take 5 mg by mouth 2 (two) times daily.  . colchicine 0.6 MG tablet Take 0.6 mg by mouth every 8 (eight) hours as needed (gout).  .Marland KitchenLIALDA 1.2 g EC  tablet Take 1.2 g by mouth 2 (two) times daily.   . Probiotic Product (VSL#3) CAPS Take 1 capsule by mouth daily. Begin after 2nd week of Vancomycin  . tinidazole (TINDAMAX) 500 MG tablet Take 1 tablet (500 mg total) by mouth daily with breakfast. For 4 weeks  . vedolizumab (ENTYVIO) 300 MG injection Inject 300 mg into the vein every 8 (eight) weeks.  . sodium chloride 0.9 % SOLN 250 mL with vedolizumab 300 MG SOLR 300 mg Inject 300 mg into the vein as directed.   Facility-Administered  Encounter Medications as of 07/12/2016  Medication  . vedolizumab (ENTYVIO) 300 mg in sodium chloride 0.9 % 250 mL infusion     Review of Systems  Constitutional:   No  weight loss, night sweats,  Fevers, chills, fatigue, or  lassitude.  HEENT:   No headaches,  Difficulty swallowing,  Tooth/dental problems, or  Sore throat,                No sneezing, itching, ear ache, nasal congestion, post nasal drip,   CV:  No chest pain,  Orthopnea, PND, swelling in lower extremities, anasarca, dizziness, palpitations, syncope.   GI  No heartburn, indigestion, abdominal pain, nausea, vomiting, diarrhea, change in bowel habits, loss of appetite, bloody stools.   Resp: No shortness of breath with exertion or at rest.  No excess mucus, no productive cough,  No non-productive cough,  No coughing up of blood.  No change in color of mucus.  No wheezing.  No chest wall deformity  Skin: no rash or lesions.  GU: no dysuria, change in color of urine, no urgency or frequency.  No flank pain, no hematuria   MS:  + gout .    Physical Exam  BP 136/82 (BP Location: Right Arm, Patient Position: Sitting, Cuff Size: Normal)   Pulse (!) 101   Temp 97.1 F (36.2 C)   Ht 5' 11"  (1.803 m)   Wt 226 lb 12.8 oz (102.9 kg)   SpO2 98%   BMI 31.63 kg/m   GEN: A/Ox3; pleasant , NAD, overweight    HEENT:  [place]/AT,  EACs-clear, TMs-wnl, NOSE-clear, THROAT-clear, no lesions, no postnasal drip or exudate noted.   NECK:  Supple w/ fair ROM; no JVD; normal carotid impulses w/o bruits; no thyromegaly or nodules palpated; no lymphadenopathy.    RESP  Clear  P & A; w/o, wheezes/ rales/ or rhonchi. no accessory muscle use, no dullness to percussion  CARD:  RRR, no m/r/g,tr to 1 + R>L  peripheral edema, pulses intact, no cyanosis or clubbing.  GI:   Soft & nt; nml bowel sounds; no organomegaly or masses detected.   Musco: Warm bil, no deformities or joint swelling noted.   Neuro: alert, no focal deficits noted.     Skin: Warm, no lesions or rashes  Psych:  No change in mood or affect. No depression or anxiety.  No memory loss.  Lab Results:  CBC    Component Value Date/Time   WBC 8.5 05/30/2016 1653   RBC 3.58 (L) 05/30/2016 1653   HGB 10.3 (L) 05/30/2016 1653   HCT 31.4 (L) 05/30/2016 1653   PLT 434.0 (H) 05/30/2016 1653   MCV 87.6 05/30/2016 1653   MCH 28.6 05/24/2016 0253   MCHC 32.9 05/30/2016 1653   RDW 15.0 05/30/2016 1653   LYMPHSABS 1.5 05/30/2016 1653   MONOABS 1.0 05/30/2016 1653   EOSABS 0.5 05/30/2016 1653   BASOSABS 0.0 05/30/2016 1653    BMET  Component Value Date/Time   NA 139 05/30/2016 1653   K 4.3 05/30/2016 1653   CL 105 05/30/2016 1653   CO2 27 05/30/2016 1653   GLUCOSE 97 05/30/2016 1653   BUN 16 05/30/2016 1653   CREATININE 1.01 05/30/2016 1653   CALCIUM 8.8 05/30/2016 1653   GFRNONAA >60 05/23/2016 0153   GFRAA >60 05/23/2016 0153    BNP    Component Value Date/Time   BNP 269.4 (H) 05/20/2016 1807    ProBNP No results found for: PROBNP  Imaging: No results found.   Assessment & Plan:   No problem-specific Assessment & Plan notes found for this encounter.     Rexene Edison, NP 07/12/2016

## 2016-07-12 NOTE — Assessment & Plan Note (Signed)
Follow up CT chest to follow RLL and LLL nodule

## 2016-07-19 ENCOUNTER — Ambulatory Visit: Payer: 59 | Admitting: Gastroenterology

## 2016-07-21 ENCOUNTER — Other Ambulatory Visit: Payer: Self-pay

## 2016-07-21 DIAGNOSIS — K50118 Crohn's disease of large intestine with other complication: Secondary | ICD-10-CM

## 2016-07-24 ENCOUNTER — Ambulatory Visit (HOSPITAL_COMMUNITY): Admission: RE | Admit: 2016-07-24 | Payer: 59 | Source: Ambulatory Visit | Admitting: Gastroenterology

## 2016-07-24 ENCOUNTER — Other Ambulatory Visit: Payer: Self-pay

## 2016-07-26 MED ORDER — VEDOLIZUMAB 300 MG IV SOLR
300.0000 mg | Freq: Once | INTRAVENOUS | Status: AC
  Administered 2016-07-27: 300 mg via INTRAVENOUS
  Filled 2016-07-26: qty 5

## 2016-07-27 ENCOUNTER — Ambulatory Visit (HOSPITAL_COMMUNITY)
Admission: RE | Admit: 2016-07-27 | Discharge: 2016-07-27 | Disposition: A | Discharge status: Home / Self Care | Payer: 59 | Source: Ambulatory Visit | Attending: Gastroenterology | Admitting: Gastroenterology

## 2016-07-27 ENCOUNTER — Encounter (HOSPITAL_COMMUNITY): Payer: Self-pay

## 2016-07-27 DIAGNOSIS — K50113 Crohn's disease of large intestine with fistula: Secondary | ICD-10-CM | POA: Diagnosis present

## 2016-07-27 MED ORDER — SODIUM CHLORIDE 0.9 % IV SOLN
INTRAVENOUS | Status: DC
  Administered 2016-07-27: 11:00:00 via INTRAVENOUS

## 2016-07-27 MED ORDER — SODIUM CHLORIDE 0.9 % IV SOLN: INTRAVENOUS | Status: DC

## 2016-08-25 DIAGNOSIS — Z0000 Encounter for general adult medical examination without abnormal findings: Secondary | ICD-10-CM | POA: Diagnosis not present

## 2016-08-25 DIAGNOSIS — Z125 Encounter for screening for malignant neoplasm of prostate: Secondary | ICD-10-CM | POA: Diagnosis not present

## 2016-08-25 DIAGNOSIS — I10 Essential (primary) hypertension: Secondary | ICD-10-CM | POA: Diagnosis not present

## 2016-08-30 ENCOUNTER — Ambulatory Visit (HOSPITAL_COMMUNITY): Payer: 59 | Attending: Internal Medicine

## 2016-08-30 ENCOUNTER — Ambulatory Visit (INDEPENDENT_AMBULATORY_CARE_PROVIDER_SITE_OTHER)
Admission: RE | Admit: 2016-08-30 | Discharge: 2016-08-30 | Disposition: A | Discharge status: Home / Self Care | Payer: 59 | Source: Ambulatory Visit | Attending: Adult Health | Admitting: Adult Health

## 2016-08-30 ENCOUNTER — Other Ambulatory Visit: Payer: Self-pay

## 2016-08-30 ENCOUNTER — Telehealth: Payer: Self-pay | Admitting: Gastroenterology

## 2016-08-30 DIAGNOSIS — K50118 Crohn's disease of large intestine with other complication: Secondary | ICD-10-CM

## 2016-08-30 DIAGNOSIS — I2782 Chronic pulmonary embolism: Secondary | ICD-10-CM

## 2016-08-30 DIAGNOSIS — R911 Solitary pulmonary nodule: Secondary | ICD-10-CM | POA: Diagnosis not present

## 2016-08-30 DIAGNOSIS — I2601 Septic pulmonary embolism with acute cor pulmonale: Secondary | ICD-10-CM | POA: Diagnosis not present

## 2016-08-30 DIAGNOSIS — I081 Rheumatic disorders of both mitral and tricuspid valves: Secondary | ICD-10-CM | POA: Insufficient documentation

## 2016-08-30 DIAGNOSIS — Z86718 Personal history of other venous thrombosis and embolism: Secondary | ICD-10-CM | POA: Diagnosis not present

## 2016-08-30 DIAGNOSIS — I2699 Other pulmonary embolism without acute cor pulmonale: Secondary | ICD-10-CM | POA: Diagnosis not present

## 2016-08-30 NOTE — Telephone Encounter (Signed)
Amy can you help me?

## 2016-09-01 DIAGNOSIS — E784 Other hyperlipidemia: Secondary | ICD-10-CM | POA: Diagnosis not present

## 2016-09-01 DIAGNOSIS — Z0000 Encounter for general adult medical examination without abnormal findings: Secondary | ICD-10-CM | POA: Diagnosis not present

## 2016-09-01 DIAGNOSIS — R7301 Impaired fasting glucose: Secondary | ICD-10-CM | POA: Diagnosis not present

## 2016-09-01 DIAGNOSIS — I10 Essential (primary) hypertension: Secondary | ICD-10-CM | POA: Diagnosis not present

## 2016-09-01 DIAGNOSIS — Z1389 Encounter for screening for other disorder: Secondary | ICD-10-CM | POA: Diagnosis not present

## 2016-09-01 DIAGNOSIS — Z125 Encounter for screening for malignant neoplasm of prostate: Secondary | ICD-10-CM | POA: Diagnosis not present

## 2016-09-01 DIAGNOSIS — Z1212 Encounter for screening for malignant neoplasm of rectum: Secondary | ICD-10-CM | POA: Diagnosis not present

## 2016-09-01 NOTE — Progress Notes (Signed)
Called spoke with patient, advised of CT results / recs as stated by TP.  Pt verbalized his understanding and denied any questions.  Pt did mention that he had his yearly physical with Dr Brigitte Pulse this morning - Dr Brigitte Pulse was able to view CT scan and pt was started on statins.

## 2016-09-01 NOTE — Progress Notes (Signed)
Called spoke with patient, advised of echo results / recs as stated by TP.  Pt verbalized his understanding and denied any questions.

## 2016-09-04 ENCOUNTER — Encounter: Payer: Self-pay | Admitting: Gastroenterology

## 2016-09-04 ENCOUNTER — Ambulatory Visit (INDEPENDENT_AMBULATORY_CARE_PROVIDER_SITE_OTHER): Payer: 59 | Admitting: Gastroenterology

## 2016-09-04 ENCOUNTER — Telehealth: Payer: Self-pay | Admitting: Gastroenterology

## 2016-09-04 VITALS — BP 140/86 | HR 72 | Ht 68.5 in | Wt 239.2 lb

## 2016-09-04 DIAGNOSIS — K50113 Crohn's disease of large intestine with fistula: Secondary | ICD-10-CM

## 2016-09-04 DIAGNOSIS — K611 Rectal abscess: Secondary | ICD-10-CM

## 2016-09-04 MED ORDER — LIALDA 1.2 G PO TBEC: 4.8000 g | DELAYED_RELEASE_TABLET | Freq: Two times a day (BID) | ORAL | 3 refills | Status: DC

## 2016-09-04 MED ORDER — TINIDAZOLE 500 MG PO TABS: 500.0000 mg | ORAL_TABLET | Freq: Two times a day (BID) | ORAL | 0 refills | Status: DC

## 2016-09-04 NOTE — Progress Notes (Signed)
Jesse English    DB:[id number]    Nov 13, 1960  Primary Care Physician:Shaw, Gwyndolyn Saxon, MD  Referring Physician: Marton Redwood, MD [address] [place], [place] [address]  Chief complaint:  Peri rectal drainage  HPI: 56 year old male with Crohn's disease complicated by perirectal abscesses and fistula here for follow-up visit. Perirectal wound associated with Fournier's gangrene has healed. He noticed a grape size swelling in the perirectal area for past 3-4 weeks, that opened up last week and has drained bloody discharge. He has had these perirectal area that would be swollen and drain intermittently for past 20-25 years and he never paid any attention. He was recommended by Dr Brigitte Pulse to make this appt today to have perirectal are evaluated. He is 3-4 semi formed bowel movements daily. No fever or chills.  He is on Entyvio infusion Q8 weeks, last was on Jan 18,2018. Repeat Echo 08/30/16 showed improvement in RV strain with normal LVEF.   Pertinent GI past history Left-sided colitiswas diagnosedin March 2017 presumed to be ulcerative colitis in the setting of C. difficile colitis and subsequent flare in the setting of recurrent c.diff colitis in April 2017 and July 2017 treated with prolonged Vancomycin taper.He developed an peri rectal abscess in October 2017 that was drained subsequent CT abd &pelvis showed persistent peri rectal abscess and was noted to have Fournier gangrene requiring surgical drainage. On 05/20/2016 he was admitted with shortness of breath noted to have saddle pulmonary embolus started on Eliquiswith improvement of shortness of breath and right heart strain.   Outpatient Encounter Prescriptions as of 09/04/2016  Medication Sig  . amLODipine (NORVASC) 5 MG tablet Take 2.5 mg by mouth daily. Pt takes 2.5mg  daily  . apixaban (ELIQUIS) 5 MG TABS tablet Take 1 tablet (5 mg total) by mouth 2 (two) times daily.  Marland Kitchen LIALDA 1.2 g EC tablet Take 1.2 g by mouth 2 (two)  times daily.   . Probiotic Product (VSL#3) CAPS Take 1 capsule by mouth daily. Begin after 2nd week of Vancomycin  . tinidazole (TINDAMAX) 500 MG tablet Take 1 tablet (500 mg total) by mouth daily with breakfast. For 4 weeks  . [DISCONTINUED] alfuzosin (UROXATRAL) 10 MG 24 hr tablet Take 10 mg by mouth daily with breakfast.  . [DISCONTINUED] colchicine 0.6 MG tablet Take 0.6 mg by mouth every 8 (eight) hours as needed (gout).   No facility-administered encounter medications on file as of 09/04/2016.     Allergies as of 09/04/2016 - Review Complete 09/04/2016  Allergen Reaction Noted  . Ace inhibitors Swelling 09/23/2015    Past Medical History:  Diagnosis Date  . Colon polyps    2013  . Crohn's colitis ([place])    vs UC with abscess  . Diverticulosis   . Fournier's gangrene in male 04/2016  . Gout   . Hypertension   . Perirectal abscess 04/2016  . Prolapsed internal hemorrhoid, grade 4   . Pulmonary embolism ([place])   . Sleep apnea    cpap    Past Surgical History:  Procedure Laterality Date  . ANAL FISSURE REPAIR    . COLONOSCOPY    . HEMORRHOID SURGERY  04/21/2016   Procedure: HEMORRHOIDECTOMY;  Surgeon: Michael Boston, MD;  Location: [facility];  Service: General;;  . INCISION AND DRAINAGE ABSCESS N/A 04/21/2016   Procedure: INCISION AND DRAINAGE OF HORSESHOE  FOURNIER GANGRENE PERIRECTAL ABSCESS;  Surgeon: Michael Boston, MD;  Location: [facility];  Service: General;  Laterality: N/A;  Family History  Problem Relation Age of Onset  . Colon cancer Neg Hx   . Esophageal cancer Neg Hx   . Stomach cancer Neg Hx   . Rectal cancer Neg Hx     Social History   Social History  . Marital status: Single    Spouse name: N/A  . Number of children: 2  . Years of education: N/A   Occupational History  . sales    Social History Main Topics  . Smoking status: Former Smoker    Types: Cigars    Quit date: 07/10/2013  . Smokeless tobacco: Never Used  . Alcohol use No     Comment: 2  per day  . Drug use: No  . Sexual activity: Not on file   Other Topics Concern  . Not on file   Social History Narrative  . No narrative on file      Review of systems: Review of Systems  Constitutional: Negative for fever and chills.  HENT: Negative.   Eyes: Negative for blurred vision.  Respiratory: Negative for cough, shortness of breath and wheezing.   Cardiovascular: Negative for chest pain and palpitations.  Gastrointestinal: as per HPI Genitourinary: Negative for dysuria, urgency, frequency and hematuria.  Musculoskeletal: Negative for myalgias, back pain and joint pain.  Skin: Negative for itching and rash.  Neurological: Negative for dizziness, tremors, focal weakness, seizures and loss of consciousness.  Endo/Heme/Allergies: Positive for seasonal allergies.  Psychiatric/Behavioral: Negative for depression, suicidal ideas and hallucinations.  All other systems reviewed and are negative.   Physical Exam: Vitals:   09/04/16 1537  BP: 140/86  Pulse: 72   Body mass index is 35.85 kg/m. Gen:      No acute distress HEENT:  EOMI, sclera anicteric Neck:     No masses; no thyromegaly Lungs:    Clear to auscultation bilaterally; normal respiratory effort CV:         Regular rate and rhythm; no murmurs Abd:      + bowel sounds; soft, non-tender; no palpable masses, no distension Ext:    No edema; adequate peripheral perfusion Skin:      Warm and dry; no rash Neuro: alert and oriented x 3 Psych: normal mood and affect Rectal exam: no anal fissure or external hemorrhoids. Peri rectal Abscess with cutaneous fistula ~1cm with induration, no fluctuation or drainage.    Data Reviewed:  Reviewed labs, radiology imaging, old records and pertinent past GI work up   Assessment and Plan/Recommendations:  56 year old male with left-sided colitis inflammatory bowel disease complicated by anorectal abscess and fistula here for follow-up visit He has small peri rectal  abscess with cutaneous fistula that has drained with minimal induration ~1cm,doesnt appear that he needs an incision to further drain it , no fluctuation IncreaseTinidazole 500 mg twice daily for 2 weeks and if has no evidence of peri rectal abscess or fistula, can discontinue after 2 weeks course Received first Entyvio induction 06/12/16, last done on Jul 27, 2016, continue maintenance dose q8 weeks Increase Lialda to 4.8gm daily Return in 2 weeks or sooner if needed  25 minutes was spent face-to-face with the patient. Greater than 50% of the time used for counseling as well as treatment plan and follow-up. He had multiple questions which were answered to his satisfaction  K. Denzil Magnuson , MD [phone] Mon-Fri 8a-5p [phone] after 5p, weekends, holidays  CC: Marton Redwood, MD

## 2016-09-04 NOTE — Telephone Encounter (Signed)
Patient had his CPE on Friday. Dr Brigitte Pulse is concerned about an area that is believes is a fistula. Patient says he thinks this area has been there for a while. Opening on the schedule today at 3:30 pm. Patient agrees to this time for an evaluation.

## 2016-09-04 NOTE — Patient Instructions (Addendum)
We have sent the following medications to your pharmacy for you to pick up at your convenience:  Lialda, Tinidazole  Please follow up on 09/29/2016 at 9:30am

## 2016-09-06 ENCOUNTER — Encounter: Payer: Self-pay | Admitting: Pulmonary Disease

## 2016-09-06 ENCOUNTER — Ambulatory Visit (INDEPENDENT_AMBULATORY_CARE_PROVIDER_SITE_OTHER): Payer: 59 | Admitting: Pulmonary Disease

## 2016-09-06 DIAGNOSIS — I2601 Septic pulmonary embolism with acute cor pulmonale: Secondary | ICD-10-CM

## 2016-09-06 DIAGNOSIS — I2782 Chronic pulmonary embolism: Secondary | ICD-10-CM

## 2016-09-06 NOTE — Assessment & Plan Note (Signed)
He had a provoked PE in 05/2016 and has done well since then. I independently reviewed the images from his CT chest today with him which showed resolution of the pulmonary infiltrates likely consistent with pulmonary infarct. Further, his echocardiogram shows normalization of his right ventricular size and function.  Moving forward his risk for recurrent pulmonary embolism is likely slightly above that of someone else's had a provoked pulmonary embolism because of his Crohn's. However, we do not have clear guidance from the literature or guidelines to suggest that he should take Eliquis long-term.  We discussed the risks and benefits of this today and he is very eager to stop Eliquis after 6 months of treatment.  Plan: Stop Eliquis and the end of May 2018 If he is ever hospitalized in the future he needs to have chemical prophylaxis for DVT.

## 2016-09-06 NOTE — Progress Notes (Signed)
   Subjective:    Patient ID: Jesse English, male    DOB: October 19, 1960, 56 y.o.   MRN: [id number]  Synopsis: First seen by [facility] in November 2017 with a large provoked pulmonary embolism.  HPI Chief Complaint  Patient presents with  . Follow-up    pt doing well, no complaints today.     He has been doing OK lately.  He has no respiratory complaints.  He has been exercising regularly.  He enjoys riding the bike and getting his HR up to 130-140 for about an hour at a time 4 times a week.    He has been told that he has another fistula with his Crohns.     Past Medical History:  Diagnosis Date  . Colon polyps    2013  . Crohn's colitis ([place])    vs UC with abscess  . Diverticulosis   . Fournier's gangrene in male 04/2016  . Gout   . Hypertension   . Perirectal abscess 04/2016  . Prolapsed internal hemorrhoid, grade 4   . Pulmonary embolism ([place])   . Sleep apnea    cpap      Review of Systems     Objective:   Physical Exam  Vitals:   09/06/16 1353  BP: (!) 148/86  Pulse: 79  SpO2: 99%  Weight: 240 lb (108.9 kg)  Height: 5' 8.5" (1.74 m)    Gen: well appearing HENT: OP clear, TM's clear, neck supple PULM: CTA B, normal percussion CV: RRR, no mgr, trace edema GI: BS+, soft, nontender Derm: no cyanosis or rash Psyche: normal mood and affect   Echo: 08/2016 Echo: LVEF 55-60%, RV size and function normal.       Assessment & Plan:  Pulmonary embolus ([place]) He had a provoked PE in 05/2016 and has done well since then. I independently reviewed the images from his CT chest today with him which showed resolution of the pulmonary infiltrates likely consistent with pulmonary infarct. Further, his echocardiogram shows normalization of his right ventricular size and function.  Moving forward his risk for recurrent pulmonary embolism is likely slightly above that of someone else's had a provoked pulmonary embolism because of his Crohn's. However, we do not  have clear guidance from the literature or guidelines to suggest that he should take Eliquis long-term.  We discussed the risks and benefits of this today and he is very eager to stop Eliquis after 6 months of treatment.  Plan: Stop Eliquis and the end of May 2018 If he is ever hospitalized in the future he needs to have chemical prophylaxis for DVT.  > 50% of this 26 minute visit spent face to face   Current Outpatient Prescriptions:  .  amLODipine (NORVASC) 5 MG tablet, Take 2.5 mg by mouth daily. Pt takes 2.74m daily, Disp: , Rfl:  .  apixaban (ELIQUIS) 5 MG TABS tablet, Take 1 tablet (5 mg total) by mouth 2 (two) times daily., Disp: 60 tablet, Rfl: 5 .  LIALDA 1.2 g EC tablet, Take 4 tablets (4.8 g total) by mouth 2 (two) times daily., Disp: 240 tablet, Rfl: 3 .  Probiotic Product (VSL#3) CAPS, Take 1 capsule by mouth daily. Begin after 2nd week of Vancomycin, Disp: 60 capsule, Rfl: 3 .  tinidazole (TINDAMAX) 500 MG tablet, Take 1 tablet (500 mg total) by mouth 2 (two) times daily. For 4 weeks, Disp: 28 tablet, Rfl: 0

## 2016-09-06 NOTE — Patient Instructions (Addendum)
Keep taking your Eliquis through the end of May Let me know if you have any difficulty breathing, leg swelling, or chest pain We will see you back on an as-needed basis

## 2016-09-21 ENCOUNTER — Ambulatory Visit (HOSPITAL_COMMUNITY): Payer: 59

## 2016-09-21 ENCOUNTER — Telehealth: Payer: Self-pay | Admitting: Gastroenterology

## 2016-09-21 NOTE — Telephone Encounter (Signed)
The patient does not have any place to get his Entyvio infusions. He is unable to get any reimbursement worked out with the day hospital. Sounds like an insurance issue. Unclear. I have sent a referral to [facility] in hopes they can help get the patient in asap. He is due his Infusion today. He has completed the induction phase.

## 2016-09-22 ENCOUNTER — Telehealth: Payer: Self-pay | Admitting: Gastroenterology

## 2016-09-22 NOTE — Telephone Encounter (Signed)
FYI Dr Silverio Decamp is this ok?

## 2016-09-22 NOTE — Telephone Encounter (Signed)
Can you check if he can come in Next Tue 09/26/16 at 3:15 PM? Thanks

## 2016-09-22 NOTE — Telephone Encounter (Signed)
Pt has been scheduled for 3 pm 09/26/16 and has an appt for 1030 am at [facility]

## 2016-09-26 ENCOUNTER — Ambulatory Visit (INDEPENDENT_AMBULATORY_CARE_PROVIDER_SITE_OTHER): Payer: 59 | Admitting: Gastroenterology

## 2016-09-26 ENCOUNTER — Encounter: Payer: Self-pay | Admitting: Gastroenterology

## 2016-09-26 VITALS — BP 152/80 | HR 72 | Ht 68.5 in | Wt 244.5 lb

## 2016-09-26 DIAGNOSIS — K50118 Crohn's disease of large intestine with other complication: Secondary | ICD-10-CM

## 2016-09-26 DIAGNOSIS — K611 Rectal abscess: Secondary | ICD-10-CM

## 2016-09-26 NOTE — Patient Instructions (Addendum)
Follow up in 1 month appointment 11/01/2016 at 8:45 am   Continue Tinidazole daily

## 2016-09-26 NOTE — Progress Notes (Signed)
Jesse English    [id number]    1961-04-10  Primary Care Physician:Shaw, Gwyndolyn Saxon, MD  Referring Physician: Marton Redwood, MD [address] [place], [place] [address]  Chief complaint: Crohn's disease HPI:  56 year old male with Crohn's disease complicated by perirectal abscesses and fistula here for follow-up visit. Perirectal wound associated with Fournier's gangrene has healed. He continues to have intermittent drainage in the perirectal area, yesterday he felt something pop in the perianal region and drained some bloody fluid. He is taking Tinidazole 250 mg twice daily He has had these perirectal area that would be swollen and drain intermittently for past 20-25 years and he never paid any attention. He is 3-4 semi formed bowel movements daily. No fever or chills.  He is on Entyvio infusion Q8 weeks, last was on Jan 18,2018. He is scheduled to receive his next infusion of Entyvio next Tuesday March 27, 2 weeks overdue.  He received a medical bill from [facility] for $4000, he is enrolled in Medical assistance program by Raylene Miyamoto but [facility] doesn't participate in the program and was billed the difference. He said he requested multiple times for ?claim forms from billing department so he can forward to [place] but hasnt received them yet.    Outpatient Encounter Prescriptions as of 09/26/2016  Medication Sig  . amLODipine (NORVASC) 5 MG tablet Take 2.5 mg by mouth daily. Pt takes 2.5mg  daily  . apixaban (ELIQUIS) 5 MG TABS tablet Take 1 tablet (5 mg total) by mouth 2 (two) times daily.  Marland Kitchen LIALDA 1.2 g EC tablet Take 4 tablets (4.8 g total) by mouth 2 (two) times daily.  . Probiotic Product (VSL#3) CAPS Take 1 capsule by mouth daily. Begin after 2nd week of Vancomycin  . tinidazole (TINDAMAX) 500 MG tablet Take 1 tablet (500 mg total) by mouth 2 (two) times daily. For 4 weeks   No facility-administered encounter medications on file as of 09/26/2016.      Allergies as of 09/26/2016 - Review Complete 09/26/2016  Allergen Reaction Noted  . Ace inhibitors Swelling 09/23/2015    Past Medical History:  Diagnosis Date  . Colon polyps    2013  . Crohn's colitis ([place])    vs UC with abscess  . Diverticulosis   . Fournier's gangrene in male 04/2016  . Gout   . Hypertension   . Perirectal abscess 04/2016  . Prolapsed internal hemorrhoid, grade 4   . Pulmonary embolism ([place])   . Sleep apnea    cpap    Past Surgical History:  Procedure Laterality Date  . ANAL FISSURE REPAIR    . COLONOSCOPY    . HEMORRHOID SURGERY  04/21/2016   Procedure: HEMORRHOIDECTOMY;  Surgeon: Michael Boston, MD;  Location: [facility];  Service: General;;  . INCISION AND DRAINAGE ABSCESS N/A 04/21/2016   Procedure: INCISION AND DRAINAGE OF HORSESHOE  FOURNIER GANGRENE PERIRECTAL ABSCESS;  Surgeon: Michael Boston, MD;  Location: [facility];  Service: General;  Laterality: N/A;    Family History  Problem Relation Age of Onset  . Colon cancer Neg Hx   . Esophageal cancer Neg Hx   . Stomach cancer Neg Hx   . Rectal cancer Neg Hx     Social History   Social History  . Marital status: Single    Spouse name: N/A  . Number of children: 2  . Years of education: N/A   Occupational History  . sales  Social History Main Topics  . Smoking status: Former Smoker    Types: Cigars    Quit date: 07/10/2013  . Smokeless tobacco: Never Used  . Alcohol use No     Comment: 2 per day  . Drug use: No  . Sexual activity: Not on file   Other Topics Concern  . Not on file   Social History Narrative  . No narrative on file      Review of systems: Review of Systems  Constitutional: Negative for fever and chills.  HENT: Negative.   Eyes: Negative for blurred vision.  Respiratory: Negative for cough, shortness of breath and wheezing.   Cardiovascular: Negative for chest pain and palpitations.  Gastrointestinal: as per HPI Genitourinary: Negative for dysuria, urgency,  frequency and hematuria.  Musculoskeletal: Negative for myalgias, back pain and joint pain.  Skin: Negative for itching and rash.  Neurological: Negative for dizziness, tremors, focal weakness, seizures and loss of consciousness.  Endo/Heme/Allergies: Positive for seasonal allergies.  Psychiatric/Behavioral: Negative for depression, suicidal ideas and hallucinations.  All other systems reviewed and are negative.   Physical Exam: Vitals:   09/26/16 1459  BP: (!) 152/80  Pulse: 72   Body mass index is 36.64 kg/m. Gen:      No acute distress HEENT:  EOMI, sclera anicteric Neck:     No masses; no thyromegaly Lungs:    Clear to auscultation bilaterally; normal respiratory effort CV:         Regular rate and rhythm; no murmurs Abd:      + bowel sounds; soft, non-tender; no palpable masses, no distension Ext:    No edema; adequate peripheral perfusion Skin:      Warm and dry; no rash Neuro: alert and oriented x 3 Psych: normal mood and affect Rectal exam: Normal anal sphincter tone, no anal fissure or external hemorrhoids. Small perirectal abscess, was able to squeeze it Anoscopy: Small internal hemorrhoids, no active bleeding, normal dentate line, no visible nodules or fistula  Data Reviewed:  Reviewed labs, radiology imaging, old records and pertinent past GI work up   Assessment and Plan/Recommendations:  56 year old male with Crohn's disease of colon complicated by anorectal fistula, Fournier's gangrene here for follow-up visit  Perirectal abscess: Noted a small abscess collection in the perirectal area, was able to drain small amount of hemorrhagic fluid during this visit. No evidence of fistulous track on rectal exam Continue tinidazole 250 mg daily for additional 4-6 weeks Advise patient to call if he notices increased in the size of the collection or increased drainage or fever Continue in Entyvio infusion  We'll plan for colonoscopy to assess disease activity and response  to treatment once he is off anticoagulation 2-3 months  Return in 4 weeks or sooner if needed  25 minutes was spent face-to-face with the patient. Greater than 50% of the time used for counseling as well as treatment plan and follow-up of Crohn's disease. He had multiple questions which were answered to his satisfaction  K. Denzil Magnuson , MD [phone] Mon-Fri 8a-5p [phone] after 5p, weekends, holidays  CC: Marton Redwood, MD

## 2016-09-27 DIAGNOSIS — K50118 Crohn's disease of large intestine with other complication: Secondary | ICD-10-CM | POA: Insufficient documentation

## 2016-09-29 ENCOUNTER — Ambulatory Visit: Payer: 59 | Admitting: Gastroenterology

## 2016-10-03 DIAGNOSIS — K5090 Crohn's disease, unspecified, without complications: Secondary | ICD-10-CM | POA: Diagnosis not present

## 2016-10-03 DIAGNOSIS — Z8739 Personal history of other diseases of the musculoskeletal system and connective tissue: Secondary | ICD-10-CM | POA: Diagnosis not present

## 2016-10-03 DIAGNOSIS — Z79899 Other long term (current) drug therapy: Secondary | ICD-10-CM | POA: Diagnosis not present

## 2016-10-10 DIAGNOSIS — K5090 Crohn's disease, unspecified, without complications: Secondary | ICD-10-CM | POA: Diagnosis not present

## 2016-10-26 ENCOUNTER — Other Ambulatory Visit: Payer: Self-pay | Admitting: Gastroenterology

## 2016-11-01 ENCOUNTER — Encounter: Payer: Self-pay | Admitting: Gastroenterology

## 2016-11-01 ENCOUNTER — Other Ambulatory Visit (INDEPENDENT_AMBULATORY_CARE_PROVIDER_SITE_OTHER): Payer: 59

## 2016-11-01 ENCOUNTER — Ambulatory Visit (INDEPENDENT_AMBULATORY_CARE_PROVIDER_SITE_OTHER): Payer: 59 | Admitting: Gastroenterology

## 2016-11-01 VITALS — BP 122/78 | HR 80 | Ht 68.5 in | Wt 240.6 lb

## 2016-11-01 DIAGNOSIS — K50014 Crohn's disease of small intestine with abscess: Secondary | ICD-10-CM

## 2016-11-01 DIAGNOSIS — K611 Rectal abscess: Secondary | ICD-10-CM

## 2016-11-01 DIAGNOSIS — K50113 Crohn's disease of large intestine with fistula: Secondary | ICD-10-CM | POA: Diagnosis not present

## 2016-11-01 DIAGNOSIS — K50114 Crohn's disease of large intestine with abscess: Secondary | ICD-10-CM

## 2016-11-01 LAB — CBC WITH DIFFERENTIAL/PLATELET
BASOS ABS: 0.1 10*3/uL (ref 0.0–0.1)
BASOS PCT: 0.6 % (ref 0.0–3.0)
EOS ABS: 0.2 10*3/uL (ref 0.0–0.7)
Eosinophils Relative: 1.6 % (ref 0.0–5.0)
HEMATOCRIT: 45.3 % (ref 39.0–52.0)
HEMOGLOBIN: 15.4 g/dL (ref 13.0–17.0)
LYMPHS PCT: 14.8 % (ref 12.0–46.0)
Lymphs Abs: 1.6 10*3/uL (ref 0.7–4.0)
MCHC: 34.1 g/dL (ref 30.0–36.0)
MCV: 89.6 fl (ref 78.0–100.0)
Monocytes Absolute: 1.4 10*3/uL — ABNORMAL HIGH (ref 0.1–1.0)
Monocytes Relative: 12.6 % — ABNORMAL HIGH (ref 3.0–12.0)
Neutro Abs: 7.5 10*3/uL (ref 1.4–7.7)
Neutrophils Relative %: 70.4 % (ref 43.0–77.0)
Platelets: 223 10*3/uL (ref 150.0–400.0)
RBC: 5.06 Mil/uL (ref 4.22–5.81)
RDW: 14.2 % (ref 11.5–15.5)
WBC: 10.7 10*3/uL — AB (ref 4.0–10.5)

## 2016-11-01 LAB — COMPREHENSIVE METABOLIC PANEL
ALBUMIN: 4 g/dL (ref 3.5–5.2)
ALT: 11 U/L (ref 0–53)
AST: 14 U/L (ref 0–37)
Alkaline Phosphatase: 49 U/L (ref 39–117)
BUN: 20 mg/dL (ref 6–23)
CHLORIDE: 103 meq/L (ref 96–112)
CO2: 27 meq/L (ref 19–32)
CREATININE: 1.21 mg/dL (ref 0.40–1.50)
Calcium: 9.4 mg/dL (ref 8.4–10.5)
GFR: 65.4 mL/min (ref >60.00)
GLUCOSE: 100 mg/dL — AB (ref 70–99)
POTASSIUM: 4 meq/L (ref 3.5–5.1)
SODIUM: 136 meq/L (ref 135–145)
Total Bilirubin: 1.5 mg/dL — ABNORMAL HIGH (ref 0.2–1.2)
Total Protein: 7.8 g/dL (ref 6.0–8.3)

## 2016-11-01 LAB — HIGH SENSITIVITY CRP: CRP HIGH SENSITIVITY: 15.66 mg/L — AB (ref 0.000–5.000)

## 2016-11-01 MED ORDER — TINIDAZOLE 500 MG PO TABS: 500.0000 mg | ORAL_TABLET | Freq: Two times a day (BID) | ORAL | 0 refills | Status: DC

## 2016-11-01 NOTE — Progress Notes (Signed)
NICOLES English    [id number]    07-22-1960  Primary Care Physician:Shaw, Gwyndolyn Saxon, MD  Referring Physician: Marton Redwood, MD [address] [place], [place] [address]  Chief complaint: Rectal pain  HPI: 56 year old male with history of Crohn's disease, perirectal abscesses, fistula, fournier's gangrene, status post incision and drainage here with complaints of perirectal pain and tenderness for past 3-4 days associated with drainage from the fistulous track. He is having intermittent chills and low-grade fever, did not check his temp. Review of system positive for nausea no vomiting . He is having one to 2 bowel movements daily with no blood. He received Entyvio 2 weeks ago. He was diagnosed with psoriasis in his feet, [place] and also groin area.    Outpatient Encounter Prescriptions as of 11/01/2016  Medication Sig  . amLODipine (NORVASC) 5 MG tablet Take 2.5 mg by mouth daily. Pt takes 2.108m daily  . LIALDA 1.2 g EC tablet Take 4 tablets (4.8 g total) by mouth 2 (two) times daily.  . Probiotic Product (VSL#3) CAPS Take 1 capsule by mouth daily. Begin after 2nd week of Vancomycin  . [DISCONTINUED] apixaban (ELIQUIS) 5 MG TABS tablet Take 1 tablet (5 mg total) by mouth 2 (two) times daily.  . [DISCONTINUED] tinidazole (TINDAMAX) 500 MG tablet Take 1 tablet (500 mg total) by mouth 2 (two) times daily. For 4 weeks  . [DISCONTINUED] tinidazole (TINDAMAX) 500 MG tablet TAKE 1 TABLET BY MOUTH EVERY DAY WITH BREAKFAST FOR 4 WEEKS   No facility-administered encounter medications on file as of 11/01/2016.     Allergies as of 11/01/2016 - Review Complete 11/01/2016  Allergen Reaction Noted  . Ace inhibitors Swelling 09/23/2015    Past Medical History:  Diagnosis Date  . Colon polyps    2013  . Crohn's colitis ([place]    vs UC with abscess  . Diverticulosis   . Fournier's gangrene in male 04/2016  . Gout   . Hypertension   . Perirectal abscess 04/2016  . Prolapsed internal  hemorrhoid, grade 4   . Pulmonary embolism ([place]   . Sleep apnea    cpap    Past Surgical History:  Procedure Laterality Date  . ANAL FISSURE REPAIR    . COLONOSCOPY    . HEMORRHOID SURGERY  04/21/2016   Procedure: HEMORRHOIDECTOMY;  Surgeon: SMichael Boston MD;  [facility]: [facility];  Service: General;;  . INCISION AND DRAINAGE ABSCESS N/A 04/21/2016   Procedure: INCISION AND DRAINAGE OF HORSESHOE  FOURNIER GANGRENE PERIRECTAL ABSCESS;  Surgeon: SMichael Boston MD;  Location: [facility];  Service: General;  Laterality: N/A;    Family History  Problem Relation Age of Onset  . Pneumonia Mother   . Pneumonia Father   . Colon cancer Neg Hx   . Esophageal cancer Neg Hx   . Stomach cancer Neg Hx   . Rectal cancer Neg Hx     Social History   Social History  . Marital status: Single    Spouse name: N/A  . Number of children: 2  . Years of education: N/A   Occupational History  . sales    Social History Main Topics  . Smoking status: Former Smoker    Types: Cigars    Quit date: 07/10/2013  . Smokeless tobacco: Never Used  . Alcohol use No     Comment: 2 per day  . Drug use: No  . Sexual activity: Not on file   Other Topics  Concern  . Not on file   Social History Narrative  . No narrative on file      Review of systems: Review of Systems  Constitutional: Negative for fever and chills.  HENT: Negative.   Eyes: Negative for blurred vision.  Respiratory: Negative for cough, shortness of breath and wheezing.   Cardiovascular: Negative for chest pain and palpitations.  Gastrointestinal: as per HPI Genitourinary: Negative for dysuria, urgency, frequency and hematuria.  Musculoskeletal: Negative for myalgias, back pain and joint pain.  Skin: Positive for itching and rash.  Neurological: Negative for dizziness, tremors, focal weakness, seizures and loss of consciousness.  Endo/Heme/Allergies: Positive for seasonal allergies.  Psychiatric/Behavioral: Negative for depression,  suicidal ideas and hallucinations.  All other systems reviewed and are negative.   Physical Exam: Vitals:   11/01/16 0841  BP: 122/78  Pulse: 80   Body mass index is 36.05 kg/m. Gen:      No acute distress HEENT:  EOMI, sclera anicteric Neck:     No masses; no thyromegaly Lungs:    Clear to auscultation bilaterally; normal respiratory effort CV:         Regular rate and rhythm; no murmurs Abd:      + bowel sounds; soft, non-tender; no palpable masses, no distension Ext:    + edema; adequate peripheral perfusion Skin:      Warm and dry; no rash Neuro: alert and oriented x 3 Psych: normal mood and affect Rectal exam: Perirectal indurated and tender with erythematous skin 2X2 cm anteriorly  Data Reviewed:  Reviewed labs, radiology imaging, old records and pertinent past GI work up   Assessment and Plan/Recommendations: 56 year old male with history of Crohn's colitis complicated by perirectal abscess, fistula, Fourneir's gangrene here with complaints of perirectal tenderness, and has indurated lesion anteriorly in the perirectal area concerning for abscess CBC, CMP, CRP Obtain MRI pelvis to see if  has any drainable collection that needs I&D Tinidazole 500 mg BID Advise patient to call with any change of her symptoms or if continues to have persistent tenderness despite antibiotics We'll need to schedule surveillance colonoscopy to assess disease activity, we'll discuss at next visit in 2 weeks If has evidence of persistent colitis, consider starting methotrexate 25 mg IM weekly to achieve remission Return in 2 weeks or sooner if needed    Damaris Hippo , MD [phone] Mon-Fri 8a-5p [phone] after 5p, weekends, holidays  CC: Marton Redwood, MD

## 2016-11-01 NOTE — Patient Instructions (Addendum)
You have been scheduled for an MRI/ Pelvis at [facility]   On 11/06/2016  . Your appointment time is 9 am  . Please arrive 15 minutes prior to your appointment time for registration purposes. Please make certain not to have anything to eat or drink 6 hours prior to your test. In addition, if you have any metal in your body, have a pacemaker or defibrillator, please be sure to let your ordering physician know. This test typically takes 45 minutes to 1 hour to complete.  Go to the basement for labs today  We have sent in your prescription to your pharmacy

## 2016-11-03 ENCOUNTER — Telehealth: Payer: Self-pay | Admitting: Gastroenterology

## 2016-11-03 NOTE — Telephone Encounter (Signed)
Reports the area began draining spontaneously today. It is draining "copious amounts" . No increase of pain. Afebrile. He understands the MRI will still be needed to be certain there are no other non-draining pockets to this abscess.

## 2016-11-06 ENCOUNTER — Ambulatory Visit (HOSPITAL_COMMUNITY)
Admission: RE | Admit: 2016-11-06 | Discharge: 2016-11-06 | Disposition: A | Discharge status: Home / Self Care | Payer: 59 | Source: Ambulatory Visit | Attending: Gastroenterology | Admitting: Gastroenterology

## 2016-11-06 DIAGNOSIS — K611 Rectal abscess: Secondary | ICD-10-CM | POA: Diagnosis not present

## 2016-11-06 DIAGNOSIS — K50914 Crohn's disease, unspecified, with abscess: Secondary | ICD-10-CM | POA: Diagnosis not present

## 2016-11-06 DIAGNOSIS — K603 Anal fistula: Secondary | ICD-10-CM | POA: Insufficient documentation

## 2016-11-06 DIAGNOSIS — K50114 Crohn's disease of large intestine with abscess: Secondary | ICD-10-CM | POA: Insufficient documentation

## 2016-11-06 MED ORDER — GADOBENATE DIMEGLUMINE 529 MG/ML IV SOLN
20.0000 mL | Freq: Once | INTRAVENOUS | Status: AC | PRN
  Administered 2016-11-06: 20 mL via INTRAVENOUS

## 2016-11-16 ENCOUNTER — Ambulatory Visit (HOSPITAL_COMMUNITY): Payer: 59

## 2016-11-16 ENCOUNTER — Encounter: Payer: Self-pay | Admitting: Gastroenterology

## 2016-11-16 ENCOUNTER — Other Ambulatory Visit (INDEPENDENT_AMBULATORY_CARE_PROVIDER_SITE_OTHER): Payer: 59

## 2016-11-16 ENCOUNTER — Ambulatory Visit (INDEPENDENT_AMBULATORY_CARE_PROVIDER_SITE_OTHER): Payer: 59 | Admitting: Gastroenterology

## 2016-11-16 VITALS — BP 142/88 | HR 72 | Ht 68.5 in | Wt 242.1 lb

## 2016-11-16 DIAGNOSIS — K604 Rectal fistula: Secondary | ICD-10-CM | POA: Diagnosis not present

## 2016-11-16 DIAGNOSIS — K50919 Crohn's disease, unspecified, with unspecified complications: Secondary | ICD-10-CM

## 2016-11-16 DIAGNOSIS — K50119 Crohn's disease of large intestine with unspecified complications: Secondary | ICD-10-CM | POA: Diagnosis not present

## 2016-11-16 LAB — HIGH SENSITIVITY CRP: CRP HIGH SENSITIVITY: 1.22 mg/L (ref 0.000–5.000)

## 2016-11-16 MED ORDER — MERCAPTOPURINE 50 MG PO TABS: 50.0000 mg | ORAL_TABLET | Freq: Every day | ORAL | 0 refills | Status: DC

## 2016-11-16 MED ORDER — TINIDAZOLE 500 MG PO TABS: 500.0000 mg | ORAL_TABLET | Freq: Every day | ORAL | 1 refills | Status: DC

## 2016-11-16 NOTE — Progress Notes (Signed)
Jesse English    [id number]    [birth year]  Primary Care Physician:Shaw, Gwyndolyn Saxon, MD  Referring Physician: Marton Redwood, MD [address] [place], [place] [address]  Chief complaint:  Crohn's disease with perirectal fistula  HPI:  56 year old male with history of Crohn's disease perirectal abscesses, Fournier's gangrene status post incision and drainage, perirectal fistula here for follow-up visit. He no longer has the perirectal abscess and is not draining any from the fistula. MRI pelvis did not show any drainable collection, just one fistulous track, the rest appeared to have healed. He is on Entyvio infusion. He is doing better and has no specific complaints. Denies any nausea, vomiting, abdominal pain, melena or bright red blood per rectum    Outpatient Encounter Prescriptions as of 11/16/2016  Medication Sig  . amLODipine (NORVASC) 5 MG tablet Take 2.5 mg by mouth daily. Pt takes 2.5mg  daily  . LIALDA 1.2 g EC tablet Take 4 tablets (4.8 g total) by mouth 2 (two) times daily.  . Probiotic Product (VSL#3) CAPS Take 1 capsule by mouth daily. Begin after 2nd week of Vancomycin  . tinidazole (TINDAMAX) 500 MG tablet Take 1 tablet (500 mg total) by mouth 2 (two) times daily. For 14 days  . mercaptopurine (PURINETHOL) 50 MG tablet Take 1 tablet (50 mg total) by mouth daily. Give on an empty stomach 1 hour before or 2 hours after meals. Caution: Chemotherapy.  . tinidazole (TINDAMAX) 500 MG tablet Take 1 tablet (500 mg total) by mouth daily with breakfast.   No facility-administered encounter medications on file as of 11/16/2016.     Allergies as of 11/16/2016 - Review Complete 11/16/2016  Allergen Reaction Noted  . Ace inhibitors Swelling 09/23/2015    Past Medical History:  Diagnosis Date  . Colon polyps    2013  . Crohn's colitis ([place])    vs UC with abscess  . Diverticulosis   . Fournier's gangrene in male 04/2016  . Gout   . Hypertension   . Perirectal  abscess 04/2016  . Prolapsed internal hemorrhoid, grade 4   . Pulmonary embolism ([place])   . Sleep apnea    cpap    Past Surgical History:  Procedure Laterality Date  . ANAL FISSURE REPAIR    . COLONOSCOPY    . HEMORRHOID SURGERY  04/21/2016   Procedure: HEMORRHOIDECTOMY;  Surgeon: Michael Boston, MD;  Location: [facility];  Service: General;;  . INCISION AND DRAINAGE ABSCESS N/A 04/21/2016   Procedure: INCISION AND DRAINAGE OF HORSESHOE  FOURNIER GANGRENE PERIRECTAL ABSCESS;  Surgeon: Michael Boston, MD;  Location: [facility];  Service: General;  Laterality: N/A;    Family History  Problem Relation Age of Onset  . Pneumonia Mother   . Pneumonia Father   . Colon cancer Neg Hx   . Esophageal cancer Neg Hx   . Stomach cancer Neg Hx   . Rectal cancer Neg Hx     Social History   Social History  . Marital status: Married    Spouse name: N/A  . Number of children: 2  . Years of education: N/A   Occupational History  . sales    Social History Main Topics  . Smoking status: Former Smoker    Types: Cigars    Quit date: 07/10/2013  . Smokeless tobacco: Never Used  . Alcohol use No     Comment: 2 per day  . Drug use: No  . Sexual activity: Not on  file   Other Topics Concern  . Not on file   Social History Narrative  . No narrative on file      Review of systems: Review of Systems  Constitutional: Negative for fever and chills.  HENT: Negative.   Eyes: Negative for blurred vision.  Respiratory: Negative for cough, shortness of breath and wheezing.   Cardiovascular: Negative for chest pain and palpitations.  Gastrointestinal: as per HPI Genitourinary: Negative for dysuria, urgency, frequency and hematuria.  Musculoskeletal:Positive for myalgias, back pain and joint pain.  Skin: Positive for itching and rash.  Neurological: Negative for dizziness, tremors, focal weakness, seizures and loss of consciousness.  Endo/Heme/Allergies: Positive for seasonal allergies.    Psychiatric/Behavioral: Negative for depression, suicidal ideas and hallucinations.  All other systems reviewed and are negative.   Physical Exam: Vitals:   11/16/16 1029  BP: (!) 142/88  Pulse: 72   Body mass index is 36.28 kg/m. Gen:      No acute distress HEENT:  EOMI, sclera anicteric Neck:     No masses; no thyromegaly Lungs:    Clear to auscultation bilaterally; normal respiratory effort CV:         Regular rate and rhythm; no murmurs Abd:      + bowel sounds; soft, non-tender; no palpable masses, no distension Ext:    No edema; adequate peripheral perfusion Skin:      Warm and dry; no rash Neuro: alert and oriented x 3 Psych: normal mood and affect Rectal exam: Normal anal sphincter tone, no anal fissure or external hemorrhoids. No perirectal abscess. One small fistula anteriorly with no drainage Anoscopy: normal dentate line, no visible nodules  Data Reviewed:  Reviewed labs, radiology imaging, old records and pertinent past GI work up   Assessment and Plan/Recommendations:  56 year old male with Crohn's disease complicated by perirectal fistula here for follow-up visit  Continue Entyvio infusion Check CRP and TPM T enzyme activity We will start 6-MP 50 mg daily Follow-up CBC and CMP in 2 weeks Return in 4 weeks or sooner if needed  25 minutes was spent face-to-face with the patient. Greater than 50% of the time used for counseling as well as treatment plan and follow-up of Crohn's disease. He had multiple questions which were answered to his satisfaction  K. Denzil Magnuson , MD [phone] Mon-Fri 8a-5p [phone] after 5p, weekends, holidays  CC: Marton Redwood, MD

## 2016-11-16 NOTE — Patient Instructions (Signed)
Go to the basement for labs today  You will come back in 2 weeks for a CBC and CMP

## 2016-11-24 LAB — SERIAL MONITORING

## 2016-11-24 LAB — THIOPURINE METABOLITES: 6-MMPN Metaboilte: <129 pmol/8x 10E8

## 2016-12-06 DIAGNOSIS — K5090 Crohn's disease, unspecified, without complications: Secondary | ICD-10-CM | POA: Diagnosis not present

## 2016-12-15 ENCOUNTER — Other Ambulatory Visit: Payer: Self-pay | Admitting: Gastroenterology

## 2016-12-15 ENCOUNTER — Telehealth: Payer: Self-pay | Admitting: Gastroenterology

## 2016-12-15 MED ORDER — MERCAPTOPURINE 50 MG PO TABS: ORAL_TABLET | ORAL | 3 refills | Status: DC

## 2016-12-15 NOTE — Telephone Encounter (Signed)
Left message that medication sent

## 2016-12-25 DIAGNOSIS — K604 Rectal fistula: Secondary | ICD-10-CM | POA: Diagnosis not present

## 2016-12-25 DIAGNOSIS — K611 Rectal abscess: Secondary | ICD-10-CM | POA: Diagnosis not present

## 2016-12-25 DIAGNOSIS — K523 Indeterminate colitis: Secondary | ICD-10-CM | POA: Diagnosis not present

## 2016-12-25 DIAGNOSIS — K50113 Crohn's disease of large intestine with fistula: Secondary | ICD-10-CM | POA: Diagnosis not present

## 2016-12-26 DIAGNOSIS — D225 Melanocytic nevi of trunk: Secondary | ICD-10-CM | POA: Diagnosis not present

## 2016-12-26 DIAGNOSIS — L821 Other seborrheic keratosis: Secondary | ICD-10-CM | POA: Diagnosis not present

## 2016-12-26 DIAGNOSIS — L814 Other melanin hyperpigmentation: Secondary | ICD-10-CM | POA: Diagnosis not present

## 2016-12-27 ENCOUNTER — Ambulatory Visit (INDEPENDENT_AMBULATORY_CARE_PROVIDER_SITE_OTHER): Payer: 59 | Admitting: Gastroenterology

## 2016-12-27 ENCOUNTER — Encounter: Payer: Self-pay | Admitting: Gastroenterology

## 2016-12-27 ENCOUNTER — Other Ambulatory Visit (INDEPENDENT_AMBULATORY_CARE_PROVIDER_SITE_OTHER): Payer: 59

## 2016-12-27 ENCOUNTER — Encounter (INDEPENDENT_AMBULATORY_CARE_PROVIDER_SITE_OTHER): Payer: Self-pay

## 2016-12-27 VITALS — BP 134/80 | HR 76 | Ht 71.0 in | Wt 242.4 lb

## 2016-12-27 DIAGNOSIS — K50113 Crohn's disease of large intestine with fistula: Secondary | ICD-10-CM | POA: Diagnosis not present

## 2016-12-27 DIAGNOSIS — K5000 Crohn's disease of small intestine without complications: Secondary | ICD-10-CM | POA: Diagnosis not present

## 2016-12-27 LAB — CBC WITH DIFFERENTIAL/PLATELET
Basophils Absolute: 0 10*3/uL (ref 0.0–0.1)
Basophils Relative: 0.6 % (ref 0.0–3.0)
EOS PCT: 2.3 % (ref 0.0–5.0)
Eosinophils Absolute: 0.2 10*3/uL (ref 0.0–0.7)
HCT: 45.2 % (ref 39.0–52.0)
Hemoglobin: 15.2 g/dL (ref 13.0–17.0)
LYMPHS ABS: 1.6 10*3/uL (ref 0.7–4.0)
Lymphocytes Relative: 21.4 % (ref 12.0–46.0)
MCHC: 33.6 g/dL (ref 30.0–36.0)
MCV: 95.2 fl (ref 78.0–100.0)
MONO ABS: 0.8 10*3/uL (ref 0.1–1.0)
MONOS PCT: 11 % (ref 3.0–12.0)
NEUTROS ABS: 4.8 10*3/uL (ref 1.4–7.7)
Neutrophils Relative %: 64.7 % (ref 43.0–77.0)
PLATELETS: 227 10*3/uL (ref 150.0–400.0)
RBC: 4.75 Mil/uL (ref 4.22–5.81)
RDW: 15.7 % — AB (ref 11.5–15.5)
WBC: 7.5 10*3/uL (ref 4.0–10.5)

## 2016-12-27 LAB — COMPREHENSIVE METABOLIC PANEL
ALK PHOS: 48 U/L (ref 39–117)
ALT: 17 U/L (ref 0–53)
AST: 16 U/L (ref 0–37)
Albumin: 4.3 g/dL (ref 3.5–5.2)
BUN: 24 mg/dL — AB (ref 6–23)
CHLORIDE: 103 meq/L (ref 96–112)
CO2: 29 meq/L (ref 19–32)
Calcium: 9.7 mg/dL (ref 8.4–10.5)
Creatinine, Ser: 1.17 mg/dL (ref 0.40–1.50)
GFR: 67.95 mL/min (ref >60.00)
GLUCOSE: 96 mg/dL (ref 70–99)
POTASSIUM: 4.3 meq/L (ref 3.5–5.1)
SODIUM: 139 meq/L (ref 135–145)
TOTAL PROTEIN: 7.7 g/dL (ref 6.0–8.3)
Total Bilirubin: 1 mg/dL (ref 0.2–1.2)

## 2016-12-27 LAB — HIGH SENSITIVITY CRP: CRP, High Sensitivity: 1.96 mg/L (ref 0.000–5.000)

## 2016-12-27 MED ORDER — NA SULFATE-K SULFATE-MG SULF 17.5-3.13-1.6 GM/177ML PO SOLN: 1.0000 | Freq: Once | ORAL | 0 refills | Status: AC

## 2016-12-27 NOTE — Progress Notes (Signed)
Jesse English    [id number]    [birth year]  Primary Care Physician:Shaw, Gwyndolyn Saxon, MD  Referring Physician: Marton Redwood, MD [address] [place], [place] [address]  Chief complaint:  Crohn's disease  HPI: 56 year old male with history of perirectal abscess and complex transsphincteric perianal fistula, here for follow-up visit. He saw Dr. Candis Shine at [facility] for second opinion on Monday, June 18. He reports that he now feels more confident about his diagnosis and management as Dr. Redmond Pulling concurred.  Continues to have persistent cutaneous fistula but no drainage or perianal pain. MRI pelvis 11/06/2016 showed complex transsphincter perianal fistula and also the cutaneous fistula no drainable abscess. He reports improvement of joint pain since started 6-MP. No blood per rectum. 1-2 soft bowel movements daily. Dermatologist yesterday and was told that he has yeast infection in the groin and his feet  Denies any nausea, vomiting, abdominal pain, melena or bright red blood per rectum    Outpatient Encounter Prescriptions as of 12/27/2016  Medication Sig  . amLODipine (NORVASC) 5 MG tablet Take 2.5 mg by mouth daily. Pt takes 2.5mg  daily  . mercaptopurine (PURINETHOL) 50 MG tablet TAKE 1 TABLET BY MOUTH DAILY ON AN EMPTY STOMACH, 1 HOUR BEFORE OR 2 HOURS AFTER A MEAL, CAUTION: CHEMOTHERAPY  . Probiotic Product (VSL#3) CAPS Take 1 capsule by mouth daily. Begin after 2nd week of Vancomycin  . tinidazole (TINDAMAX) 500 MG tablet Take 1 tablet (500 mg total) by mouth 2 (two) times daily. For 14 days  . [DISCONTINUED] LIALDA 1.2 g EC tablet Take 4 tablets (4.8 g total) by mouth 2 (two) times daily.  . [DISCONTINUED] tinidazole (TINDAMAX) 500 MG tablet Take 1 tablet (500 mg total) by mouth daily with breakfast.   No facility-administered encounter medications on file as of 12/27/2016.     Allergies as of 12/27/2016 - Review Complete 12/27/2016  Allergen Reaction Noted  . Ace  inhibitors Swelling 09/23/2015    Past Medical History:  Diagnosis Date  . Colon polyps    2013  . Crohn's colitis ([place])    vs UC with abscess  . Diverticulosis   . Fournier's gangrene in male 04/2016  . Gout   . Hypertension   . Perirectal abscess 04/2016  . Prolapsed internal hemorrhoid, grade 4   . Pulmonary embolism ([place])   . Sleep apnea    cpap    Past Surgical History:  Procedure Laterality Date  . ANAL FISSURE REPAIR    . COLONOSCOPY    . HEMORRHOID SURGERY  04/21/2016   Procedure: HEMORRHOIDECTOMY;  Surgeon: Michael Boston, MD;  Location: [facility];  Service: General;;  . INCISION AND DRAINAGE ABSCESS N/A 04/21/2016   Procedure: INCISION AND DRAINAGE OF HORSESHOE  FOURNIER GANGRENE PERIRECTAL ABSCESS;  Surgeon: Michael Boston, MD;  Location: [facility];  Service: General;  Laterality: N/A;    Family History  Problem Relation Age of Onset  . Pneumonia Mother   . Pneumonia Father   . Colon cancer Neg Hx   . Esophageal cancer Neg Hx   . Stomach cancer Neg Hx   . Rectal cancer Neg Hx     Social History   Social History  . Marital status: Married    Spouse name: N/A  . Number of children: 2  . Years of education: N/A   Occupational History  . sales    Social History Main Topics  . Smoking status: Former Smoker    Types:  Cigars    Quit date: 07/10/2013  . Smokeless tobacco: Never Used  . Alcohol use No     Comment: 2 per day  . Drug use: No  . Sexual activity: Not on file   Other Topics Concern  . Not on file   Social History Narrative  . No narrative on file      Review of systems: Review of Systems  Constitutional: Negative for fever and chills.  HENT: Negative.   Eyes: Negative for blurred vision.  Respiratory: Negative for cough, shortness of breath and wheezing.   Cardiovascular: Negative for chest pain and palpitations.  Gastrointestinal: as per HPI Genitourinary: Negative for dysuria, urgency, frequency and hematuria.  Musculoskeletal:  Negative for myalgias, back pain and joint pain.  Skin: Negative for itching and rash.  Neurological: Negative for dizziness, tremors, focal weakness, seizures and loss of consciousness.  Endo/Heme/Allergies: Positive for seasonal allergies.  Psychiatric/Behavioral: Negative for depression, suicidal ideas and hallucinations.  All other systems reviewed and are negative.   Physical Exam: Vitals:   12/27/16 1038  BP: 134/80  Pulse: 76   Body mass index is 33.8 kg/m. Gen:      No acute distress HEENT:  EOMI, sclera anicteric Neck:     No masses; no thyromegaly Lungs:    Clear to auscultation bilaterally; normal respiratory effort CV:         Regular rate and rhythm; no murmurs Abd:      + bowel sounds; soft, non-tender; no palpable masses, no distension Ext:    No edema; adequate peripheral perfusion Skin:      Warm and dry; no rash Neuro: alert and oriented x 3 Psych: normal mood and affect  Data Reviewed:  Reviewed labs, radiology imaging, old records and pertinent past GI work up   Assessment and Plan/Recommendations:  56 year old male with history of Crohn's disease complicated by perianal transsphincteric complex fistula, Fournier's gangrene, perirectal abscess, pulmonary embolus/DVT status post anticoagulation  Crohn's disease: Clinically he appears to be in remission on Entyvio infusion  6-MP was added 6 weeks ago for additional immunosuppression and  potentially improve healing of transsphincteric fistula Follow-up CRP, CBC and CMP He reports improvement of joint pain since starting 6-MP  Schedule colonoscopy with biopsies to assess disease activity and response The risks and benefits as well as alternatives of endoscopic procedure(s) have been discussed and reviewed. All questions answered. The patient agrees to proceed.  Discontinue tinidazole as he hasn't had significant improvement of the fistula and has groin yeast infection  We'll consider repeat MRI in next  6-8 weeks, he still has persistent cutaneous fistula  25 minutes was spent face-to-face with the patient. Greater than 50% of the time used for counseling as well as treatment plan and follow-up. He had multiple questions which were answered to his satisfaction  K. Denzil Magnuson , MD [phone] Mon-Fri 8a-5p [phone] after 5p, weekends, holidays  CC: Marton Redwood, MD

## 2016-12-27 NOTE — Patient Instructions (Signed)
Go to the basement for labs today  You have been scheduled for a colonoscopy. Please follow written instructions given to you at your visit today.  Please pick up your prep supplies at the pharmacy within the next 1-3 days. If you use inhalers (even only as needed), please bring them with you on the day of your procedure. Your physician has requested that you go to www.startemmi.com and enter the access code given to you at your visit today. This web site gives a general overview about your procedure. However, you should still follow specific instructions given to you by our office regarding your preparation for the procedure.  Stop Tinidazole   Continue 6MP and Entyvio

## 2016-12-28 ENCOUNTER — Encounter: Payer: Self-pay | Admitting: Gastroenterology

## 2017-01-11 ENCOUNTER — Other Ambulatory Visit: Payer: Self-pay

## 2017-01-11 ENCOUNTER — Ambulatory Visit (AMBULATORY_SURGERY_CENTER): Payer: 59 | Admitting: Gastroenterology

## 2017-01-11 ENCOUNTER — Encounter: Payer: Self-pay | Admitting: Gastroenterology

## 2017-01-11 VITALS — BP 126/69 | HR 57 | Temp 97.8°F | Resp 13 | Ht 71.0 in | Wt 242.0 lb

## 2017-01-11 DIAGNOSIS — K50113 Crohn's disease of large intestine with fistula: Secondary | ICD-10-CM

## 2017-01-11 DIAGNOSIS — K5090 Crohn's disease, unspecified, without complications: Secondary | ICD-10-CM | POA: Diagnosis not present

## 2017-01-11 DIAGNOSIS — K599 Functional intestinal disorder, unspecified: Secondary | ICD-10-CM | POA: Diagnosis not present

## 2017-01-11 DIAGNOSIS — K319 Disease of stomach and duodenum, unspecified: Secondary | ICD-10-CM | POA: Diagnosis not present

## 2017-01-11 MED ORDER — SODIUM CHLORIDE 0.9 % IV SOLN: 500.0000 mL | INTRAVENOUS | Status: DC

## 2017-01-11 NOTE — Progress Notes (Signed)
Called to room to assist during endoscopic procedure.  Patient ID and intended procedure confirmed with present staff. Received instructions for my participation in the procedure from the performing physician.  

## 2017-01-11 NOTE — Op Note (Signed)
[place] Patient Name: Jesse English Procedure Date: 01/11/2017 9:18 AM MRN: [id number] Endoscopist: Mauri Pole , MD Age: 56 Referring MD:  Date of Birth: October 17, 1960 Gender: Male Account #: [id number] Procedure:                Colonoscopy Indications:              Determine extent and severity of inflammatory bowel                            disease, Obtain more precise diagnosis of                            inflammatory bowel disease Medicines:                Monitored Anesthesia Care Procedure:                Pre-Anesthesia Assessment:                           - Prior to the procedure, a History and Physical                            was performed, and patient medications and                            allergies were reviewed. The patient's tolerance of                            previous anesthesia was also reviewed. The risks                            and benefits of the procedure and the sedation                            options and risks were discussed with the patient.                            All questions were answered, and informed consent                            was obtained. Prior Anticoagulants: The patient has                            taken no previous anticoagulant or antiplatelet                            agents. ASA Grade Assessment: II - A patient with                            mild systemic disease. After reviewing the risks                            and benefits, the patient was deemed in  satisfactory condition to undergo the procedure.                           After obtaining informed consent, the colonoscope                            was passed under direct vision. Throughout the                            procedure, the patient's blood pressure, pulse, and                            oxygen saturations were monitored continuously. The                            Colonoscope was introduced through  the anus and                            advanced to the the terminal ileum, with                            identification of the appendiceal orifice and IC                            valve. The colonoscopy was performed without                            difficulty. The patient tolerated the procedure                            well. The quality of the bowel preparation was                            excellent. The terminal ileum, ileocecal valve,                            appendiceal orifice, and rectum were photographed. Scope In: 9:41:52 AM Scope Out: 10:00:33 AM Scope Withdrawal Time: 0 hours 13 minutes 35 seconds  Total Procedure Duration: 0 hours 18 minutes 41 seconds  Findings:                 The perianal and digital rectal examinations were                            normal. Pertinent negatives include no evidence or                            perianal abscess or fistula.                           The terminal ileum appeared normal. Biopsies were                            taken with a cold forceps for histology.  Scattered small and large-mouthed diverticula were                            found in the sigmoid colon and descending colon.                           Inflammation characterized by erythema was found as                            small patches surrounded by normal mucosa in the                            rectum. This was mild in severity, and when                            compared to previous examinations, the findings are                            improved. Multiple biopsies were obtained with cold                            forceps for histology randomly in the rectum, in                            the sigmoid colon, in the descending colon, in the                            transverse colon, in the ascending colon and in the                            cecum.                           The exam was otherwise without  abnormality. Complications:            No immediate complications. Estimated Blood Loss:     Estimated blood loss was minimal. Impression:               - The examined portion of the ileum was normal.                            Biopsied.                           - Diverticulosis in the sigmoid colon and in the                            descending colon.                           - Proctitis. Inflammation was found in the rectum.                            This was mild in severity. The findings are  improved compared to previous examinations.                           - The examination was otherwise normal.                           - Multiple biopsies were obtained in the rectum, in                            the sigmoid colon, in the descending colon, in the                            transverse colon, in the ascending colon and in the                            cecum. Recommendation:           - Patient has a contact number available for                            emergencies. The signs and symptoms of potential                            delayed complications were discussed with the                            patient. Return to normal activities tomorrow.                            Written discharge instructions were provided to the                            patient.                           - Resume previous diet.                           - Continue present medications.                           - Await pathology results.                           - Repeat colonoscopy date to be determined after                            pending pathology results are reviewed for                            surveillance based on pathology results.                           - MRI pelvis with contrast to document complete  resolution of perianal fistula and abscess                           - Return to GI clinic in 3 months. Mauri Pole, MD 01/11/2017 10:09:35 AM This report has been signed electronically.

## 2017-01-11 NOTE — Patient Instructions (Signed)
YOU HAD AN ENDOSCOPIC PROCEDURE TODAY AT [facility]:   Refer to the procedure report that was given to you for any specific questions about what was found during the examination.  If the procedure report does not answer your questions, please call your gastroenterologist to clarify.  If you requested that your care partner not be given the details of your procedure findings, then the procedure report has been included in a sealed envelope for you to review at your convenience later.  YOU SHOULD EXPECT: Some feelings of bloating in the abdomen. Passage of more gas than usual.  Walking can help get rid of the air that was put into your GI tract during the procedure and reduce the bloating. If you had a lower endoscopy (such as a colonoscopy or flexible sigmoidoscopy) you may notice spotting of blood in your stool or on the toilet paper. If you underwent a bowel prep for your procedure, you may not have a normal bowel movement for a few days.  Please Note:  You might notice some irritation and congestion in your nose or some drainage.  This is from the oxygen used during your procedure.  There is no need for concern and it should clear up in a day or so.  SYMPTOMS TO REPORT IMMEDIATELY:   Following lower endoscopy (colonoscopy or flexible sigmoidoscopy):  Excessive amounts of blood in the stool  Significant tenderness or worsening of abdominal pains  Swelling of the abdomen that is new, acute  Fever of 100F or higher    For urgent or emergent issues, a gastroenterologist can be reached at any hour by calling [phone].   DIET:  We do recommend a small meal at first, but then you may proceed to your regular diet.  Drink plenty of fluids but you should avoid alcoholic beverages for 24 hours.  ACTIVITY:  You should plan to take it easy for the rest of today and you should NOT DRIVE or use heavy machinery until tomorrow (because of the sedation medicines used during the test).     FOLLOW UP: Our staff will call the number listed on your records the next business day following your procedure to check on you and address any questions or concerns that you may have regarding the information given to you following your procedure. If we do not reach you, we will leave a message.  However, if you are feeling well and you are not experiencing any problems, there is no need to return our call.  We will assume that you have returned to your regular daily activities without incident.  If any biopsies were taken you will be contacted by phone or by letter within the next 1-3 weeks.  Please call us at [phone] if you have not heard about the biopsies in 3 weeks.    SIGNATURES/CONFIDENTIALITY: You and/or your care partner have signed paperwork which will be entered into your electronic medical record.  These signatures attest to the fact that that the information above on your After Visit Summary has been reviewed and is understood.  Full responsibility of the confidentiality of this discharge information lies with you and/or your care-partner.   Resume medications.MRI of Pelvis with Contrast, office will schedule. Return to office in 3 months.

## 2017-01-11 NOTE — Progress Notes (Signed)
Report to PACU, RN, vss, BBS= Clear.  

## 2017-01-12 ENCOUNTER — Telehealth: Payer: Self-pay | Admitting: *Deleted

## 2017-01-12 NOTE — Telephone Encounter (Signed)
  Follow up Call-  Call back number 01/11/2017 09/23/2015  Post procedure Call Back phone  # [phone]  Permission to leave phone message Yes Yes  Some recent data might be hidden     Patient questions:  Do you have a fever, pain , or abdominal swelling? No. Pain Score  0 *  Have you tolerated food without any problems? Yes.    Have you been able to return to your normal activities? Yes.    Do you have any questions about your discharge instructions: Diet   No. Medications  No. Follow up visit  No.  Do you have questions or concerns about your Care? No.  Actions: * If pain score is 4 or above: No action needed, pain <4.

## 2017-01-15 ENCOUNTER — Other Ambulatory Visit: Payer: Self-pay

## 2017-01-15 ENCOUNTER — Telehealth: Payer: Self-pay | Admitting: Gastroenterology

## 2017-01-15 DIAGNOSIS — K50114 Crohn's disease of large intestine with abscess: Secondary | ICD-10-CM

## 2017-01-15 NOTE — Telephone Encounter (Signed)
Order changed. Notified [place] and our referral coordinator.

## 2017-02-02 ENCOUNTER — Ambulatory Visit
Admission: RE | Admit: 2017-02-02 | Discharge: 2017-02-02 | Disposition: A | Discharge status: Home / Self Care | Payer: 59 | Source: Ambulatory Visit | Attending: Gastroenterology | Admitting: Gastroenterology

## 2017-02-02 DIAGNOSIS — K50114 Crohn's disease of large intestine with abscess: Secondary | ICD-10-CM

## 2017-02-02 DIAGNOSIS — K5000 Crohn's disease of small intestine without complications: Secondary | ICD-10-CM | POA: Diagnosis not present

## 2017-02-02 MED ORDER — GADOBENATE DIMEGLUMINE 529 MG/ML IV SOLN
20.0000 mL | Freq: Once | INTRAVENOUS | Status: AC | PRN
  Administered 2017-02-02: 20 mL via INTRAVENOUS

## 2017-02-05 DIAGNOSIS — K5090 Crohn's disease, unspecified, without complications: Secondary | ICD-10-CM | POA: Diagnosis not present

## 2017-02-08 DIAGNOSIS — K50113 Crohn's disease of large intestine with fistula: Secondary | ICD-10-CM | POA: Diagnosis not present

## 2017-02-08 DIAGNOSIS — K610 Anal abscess: Secondary | ICD-10-CM | POA: Diagnosis not present

## 2017-02-09 ENCOUNTER — Telehealth: Payer: Self-pay | Admitting: Gastroenterology
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
## 2017-02-09 NOTE — Telephone Encounter (Signed)
Patient had an abscess develop between his rectum and his scrotum. He was seen by [facility] surgeon 02/08/17. Note requested. Abscess was lanced and drained. MRI and colonoscopy were also discussed. Surgeon states he feels [facility] Rodney will "take care of " or "should help" with the issues noted on the MRI. Patient is scheduled to return Surgeon 02/23/17.  Patient wants to know if he can reschedule the GI appointment 02/13/17.

## 2017-02-09 NOTE — Telephone Encounter (Signed)
Patient contacted. Next opening is 04/03/17.He agrees to this appointment.

## 2017-02-09 NOTE — Telephone Encounter (Signed)
Ok to reschedule, please try to bring him back in 2-3 weeks. Thanks

## 2017-02-13 ENCOUNTER — Ambulatory Visit: Payer: 59 | Admitting: Gastroenterology

## 2017-03-21 DIAGNOSIS — Z23 Encounter for immunization: Secondary | ICD-10-CM | POA: Diagnosis not present

## 2017-04-02 DIAGNOSIS — K5090 Crohn's disease, unspecified, without complications: Secondary | ICD-10-CM | POA: Diagnosis not present

## 2017-04-03 ENCOUNTER — Encounter: Payer: Self-pay | Admitting: Gastroenterology

## 2017-04-03 ENCOUNTER — Other Ambulatory Visit (INDEPENDENT_AMBULATORY_CARE_PROVIDER_SITE_OTHER): Payer: 59

## 2017-04-03 ENCOUNTER — Encounter (INDEPENDENT_AMBULATORY_CARE_PROVIDER_SITE_OTHER): Payer: Self-pay

## 2017-04-03 ENCOUNTER — Ambulatory Visit (INDEPENDENT_AMBULATORY_CARE_PROVIDER_SITE_OTHER): Payer: 59 | Admitting: Gastroenterology

## 2017-04-03 VITALS — BP 122/80 | HR 60 | Ht 71.0 in | Wt 252.6 lb

## 2017-04-03 DIAGNOSIS — K50113 Crohn's disease of large intestine with fistula: Secondary | ICD-10-CM | POA: Diagnosis not present

## 2017-04-03 DIAGNOSIS — K611 Rectal abscess: Secondary | ICD-10-CM | POA: Diagnosis not present

## 2017-04-03 DIAGNOSIS — K603 Anal fistula: Secondary | ICD-10-CM

## 2017-04-03 DIAGNOSIS — K50013 Crohn's disease of small intestine with fistula: Secondary | ICD-10-CM

## 2017-04-03 LAB — CBC WITH DIFFERENTIAL/PLATELET
BASOS ABS: 0 10*3/uL (ref 0.0–0.1)
Basophils Relative: 0.8 % (ref 0.0–3.0)
EOS PCT: 3.4 % (ref 0.0–5.0)
Eosinophils Absolute: 0.2 10*3/uL (ref 0.0–0.7)
HCT: 45.4 % (ref 39.0–52.0)
Hemoglobin: 15.4 g/dL (ref 13.0–17.0)
LYMPHS ABS: 1.2 10*3/uL (ref 0.7–4.0)
Lymphocytes Relative: 19.9 % (ref 12.0–46.0)
MCHC: 34 g/dL (ref 30.0–36.0)
MCV: 98 fl (ref 78.0–100.0)
MONO ABS: 0.7 10*3/uL (ref 0.1–1.0)
MONOS PCT: 12.7 % — AB (ref 3.0–12.0)
NEUTROS ABS: 3.7 10*3/uL (ref 1.4–7.7)
NEUTROS PCT: 63.2 % (ref 43.0–77.0)
PLATELETS: 212 10*3/uL (ref 150.0–400.0)
RBC: 4.63 Mil/uL (ref 4.22–5.81)
RDW: 13.8 % (ref 11.5–15.5)
WBC: 5.9 10*3/uL (ref 4.0–10.5)

## 2017-04-03 LAB — COMPREHENSIVE METABOLIC PANEL
ALK PHOS: 48 U/L (ref 39–117)
ALT: 16 U/L (ref 0–53)
AST: 16 U/L (ref 0–37)
Albumin: 4.1 g/dL (ref 3.5–5.2)
BILIRUBIN TOTAL: 1.1 mg/dL (ref 0.2–1.2)
BUN: 25 mg/dL — ABNORMAL HIGH (ref 6–23)
CO2: 29 meq/L (ref 19–32)
CREATININE: 1.43 mg/dL (ref 0.40–1.50)
Calcium: 9.9 mg/dL (ref 8.4–10.5)
Chloride: 103 mEq/L (ref 96–112)
GFR: 53.85 mL/min — AB (ref >60.00)
GLUCOSE: 100 mg/dL — AB (ref 70–99)
Potassium: 4.5 mEq/L (ref 3.5–5.1)
Sodium: 139 mEq/L (ref 135–145)
TOTAL PROTEIN: 7.7 g/dL (ref 6.0–8.3)

## 2017-04-03 LAB — C-REACTIVE PROTEIN: CRP: 0.2 mg/dL — ABNORMAL LOW (ref 0.5–20.0)

## 2017-04-03 NOTE — Patient Instructions (Signed)
Go to the basement for labs today  You have been scheduled for an MRI/PELVIS at [facility] on 05/07/2017. Your appointment time is 8:4am. Please arrive 15 minutes prior to your appointment time for registration purposes. Please make certain not to have anything to eat or drink 6 hours prior to your test. In addition, if you have any metal in your body, have a pacemaker or defibrillator, please be sure to let your ordering physician know. This test typically takes 45 minutes to 1 hour to complete. Should you need to reschedule, please call [phone] to do so.

## 2017-04-05 NOTE — Progress Notes (Signed)
Jesse English    [id number]    [birth year]  Primary Care Physician:Shaw, Gwyndolyn Saxon, MD  Referring Physician: Marton Redwood, MD [address] [place], [place] [address]  Chief complaint:  Crohn's colitis, perianal fistula and perianal abscess HPI: Denies any drainage, noted persistent peri anal fistula on MRI. He had a small abscess drained by Dr Johney Maine. He is having formed 1-2 bowel movements daily. Denies any nausea, vomiting, abdominal pain, melena or bright red blood per rectum    Prior HPI: 56 year old male with history of perirectal abscess and complex transsphincteric perianal fistula, here for follow-up visit. He saw Dr. Candis Shine at [facility] for second opinion on Monday, June 18. He reports that he now feels more confident about his diagnosis and management as Dr. Redmond Pulling concurred.  Continues to have persistent cutaneous fistula but no drainage or perianal pain. MRI pelvis 11/06/2016 showed complex transsphincter perianal fistula and also the cutaneous fistula no drainable abscess. He reports improvement of joint pain since started 6-MP. No blood per rectum. 1-2 soft bowel movements daily. Dermatologist yesterday and was told that he has yeast infection in the groin and his feet  Outpatient Encounter Prescriptions as of 04/03/2017  Medication Sig  . hydrochlorothiazide (HYDRODIURIL) 12.5 MG tablet Take 1 tablet by mouth daily.  . mercaptopurine (PURINETHOL) 50 MG tablet TAKE 1 TABLET BY MOUTH DAILY ON AN EMPTY STOMACH, 1 HOUR BEFORE OR 2 HOURS AFTER A MEAL, CAUTION: CHEMOTHERAPY  . [DISCONTINUED] amLODipine (NORVASC) 5 MG tablet Take 2.5 mg by mouth daily. Pt takes 2.5mg  daily  . [DISCONTINUED] Probiotic Product (VSL#3) CAPS Take 1 capsule by mouth daily. Begin after 2nd week of Vancomycin (Patient not taking: Reported on 01/11/2017)  . [DISCONTINUED] tinidazole (TINDAMAX) 500 MG tablet Take 1 tablet (500 mg total) by mouth 2 (two) times daily. For 14 days (Patient not  taking: Reported on 01/11/2017)   Facility-Administered Encounter Medications as of 04/03/2017  Medication  . 0.9 %  sodium chloride infusion    Allergies as of 04/03/2017 - Review Complete 04/03/2017  Allergen Reaction Noted  . Ace inhibitors Swelling 09/23/2015    Past Medical History:  Diagnosis Date  . Colon polyps    2013  . Crohn's colitis ([place])    vs UC with abscess  . Diverticulosis   . Fournier's gangrene in male 04/2016  . Gout   . Hypertension   . Perirectal abscess 04/2016  . Prolapsed internal hemorrhoid, grade 4   . Pulmonary embolism ([place])   . Sleep apnea    cpap    Past Surgical History:  Procedure Laterality Date  . ANAL FISSURE REPAIR    . COLONOSCOPY    . HEMORRHOID SURGERY  04/21/2016   Procedure: HEMORRHOIDECTOMY;  Surgeon: Michael Boston, MD;  Location: [facility];  Service: General;;  . INCISION AND DRAINAGE ABSCESS N/A 04/21/2016   Procedure: INCISION AND DRAINAGE OF HORSESHOE  FOURNIER GANGRENE PERIRECTAL ABSCESS;  Surgeon: Michael Boston, MD;  Location: [facility];  Service: General;  Laterality: N/A;    Family History  Problem Relation Age of Onset  . Pneumonia Mother   . Pneumonia Father   . Colon cancer Neg Hx   . Esophageal cancer Neg Hx   . Stomach cancer Neg Hx   . Rectal cancer Neg Hx     Social History   Social History  . Marital status: Married    Spouse name: N/A  . Number of children: 2  .  Years of education: N/A   Occupational History  . sales    Social History Main Topics  . Smoking status: Former Smoker    Types: Cigars    Quit date: 07/10/2013  . Smokeless tobacco: Never Used  . Alcohol use No     Comment: 2 per day  . Drug use: No  . Sexual activity: Not on file   Other Topics Concern  . Not on file   Social History Narrative  . No narrative on file      Review of systems: Review of Systems  Constitutional: Negative for fever and chills.  HENT: Negative.   Eyes: Negative for blurred vision.  Respiratory:  Negative for cough, shortness of breath and wheezing.   Cardiovascular: Negative for chest pain and palpitations.  Gastrointestinal: as per HPI Genitourinary: Negative for dysuria, urgency, frequency and hematuria.  Musculoskeletal: Negative for myalgias, back pain and joint pain.  Skin: Negative for itching and rash.  Neurological: Negative for dizziness, tremors, focal weakness, seizures and loss of consciousness.  Endo/Heme/Allergies: Positive for seasonal allergies.  Psychiatric/Behavioral: Negative for depression, suicidal ideas and hallucinations.  All other systems reviewed and are negative.   Physical Exam: Vitals:   04/03/17 0827  BP: 122/80  Pulse: 60   Body mass index is 35.23 kg/m. Gen:      No acute distress HEENT:  EOMI, sclera anicteric Neck:     No masses; no thyromegaly Lungs:    Clear to auscultation bilaterally; normal respiratory effort CV:         Regular rate and rhythm; no murmurs Abd:      + bowel sounds; soft, non-tender; no palpable masses, no distension Ext:    No edema; adequate peripheral perfusion Skin:      Warm and dry; no rash Neuro: alert and oriented x 3 Psych: normal mood and affect Rectal exam: Normal anal sphincter tone, no anal fissure or external hemorrhoids. Small perianal cutaneous fistula with no drainage and furuncle   Data Reviewed:  Reviewed labs, radiology imaging, old records and pertinent past GI work up   Assessment and Plan/Recommendations:  56 yr M with Crohn's colitis complicated by perianal fistula here for follow up visit.  Colonoscopy 01/2017 did not show any evidence of active inflammation Persistent small cutaneous fistulous track in the perianal region Will repeat MRI pelvis in 3 months to document healing Continue Entyvio infusion F/u CBC, CMP, CRP   15 minutes was spent face-to-face with the patient. Greater than 50% of the time used for counseling as well as treatment plan and follow-up. He had multiple  questions which were answered to his satisfaction  K. Denzil Magnuson , MD [phone] Mon-Fri 8a-5p [phone] after 5p, weekends, holidays  CC: Marton Redwood, MD

## 2017-05-07 ENCOUNTER — Ambulatory Visit (HOSPITAL_COMMUNITY)
Admission: RE | Admit: 2017-05-07 | Discharge: 2017-05-07 | Disposition: A | Discharge status: Home / Self Care | Payer: 59 | Source: Ambulatory Visit | Attending: Gastroenterology | Admitting: Gastroenterology

## 2017-05-07 DIAGNOSIS — K50113 Crohn's disease of large intestine with fistula: Secondary | ICD-10-CM | POA: Diagnosis not present

## 2017-05-07 DIAGNOSIS — K603 Anal fistula: Secondary | ICD-10-CM | POA: Insufficient documentation

## 2017-05-07 DIAGNOSIS — K611 Rectal abscess: Secondary | ICD-10-CM

## 2017-05-07 MED ORDER — GADOBENATE DIMEGLUMINE 529 MG/ML IV SOLN
20.0000 mL | Freq: Once | INTRAVENOUS | Status: AC | PRN
  Administered 2017-05-07: 20 mL via INTRAVENOUS

## 2017-05-10 ENCOUNTER — Other Ambulatory Visit: Payer: Self-pay | Admitting: Gastroenterology

## 2017-05-14 ENCOUNTER — Other Ambulatory Visit: Payer: Self-pay | Admitting: Gastroenterology

## 2017-05-28 DIAGNOSIS — K5090 Crohn's disease, unspecified, without complications: Secondary | ICD-10-CM | POA: Diagnosis not present

## 2017-06-05 ENCOUNTER — Ambulatory Visit: Payer: 59 | Admitting: Gastroenterology

## 2017-07-10 DIAGNOSIS — C801 Malignant (primary) neoplasm, unspecified: Secondary | ICD-10-CM

## 2017-07-10 HISTORY — DX: Malignant (primary) neoplasm, unspecified: C80.1

## 2017-07-12 ENCOUNTER — Other Ambulatory Visit: Payer: Self-pay | Admitting: Gastroenterology

## 2017-07-30 DIAGNOSIS — K5090 Crohn's disease, unspecified, without complications: Secondary | ICD-10-CM | POA: Diagnosis not present

## 2017-08-10 ENCOUNTER — Other Ambulatory Visit: Payer: Self-pay | Admitting: Gastroenterology

## 2017-09-10 ENCOUNTER — Other Ambulatory Visit: Payer: Self-pay | Admitting: Gastroenterology

## 2017-09-24 DIAGNOSIS — K5090 Crohn's disease, unspecified, without complications: Secondary | ICD-10-CM | POA: Diagnosis not present

## 2017-10-03 ENCOUNTER — Telehealth: Payer: Self-pay | Admitting: *Deleted

## 2017-10-03 NOTE — Telephone Encounter (Signed)
Patient scheduled for 12/19/17 at 8:45am

## 2017-10-05 DIAGNOSIS — I10 Essential (primary) hypertension: Secondary | ICD-10-CM | POA: Diagnosis not present

## 2017-10-05 DIAGNOSIS — Z125 Encounter for screening for malignant neoplasm of prostate: Secondary | ICD-10-CM | POA: Diagnosis not present

## 2017-10-05 DIAGNOSIS — M109 Gout, unspecified: Secondary | ICD-10-CM | POA: Diagnosis not present

## 2017-10-05 DIAGNOSIS — R82998 Other abnormal findings in urine: Secondary | ICD-10-CM | POA: Diagnosis not present

## 2017-10-09 ENCOUNTER — Other Ambulatory Visit: Payer: Self-pay | Admitting: Gastroenterology

## 2017-10-10 IMAGING — MR MR PELVIS WO/W CM
4 of 8 series · 18 of 48 positions shown · IV contrast (Yes)
Comparison: 04/20/2016 CT abdomen/pelvis.

CLINICAL DATA: 55-year-old male with Crohn disease, anal fistula
and history of multiple rectal abscess drainages. Persistent rectal
pain and loose stools.

EXAM:
MRI PELVIS WITHOUT AND WITH CONTRAST
TECHNIQUE: Multiplanar multisequence MR imaging of the pelvis was performed
both before and after administration of intravenous contrast.
CONTRAST:  20 cc MultiHance IV.

[Series 5: T2 · sagittal · 2.0mm · 0.51mm/px · 8 of 89 slices shown (1 of 2)]
[im 1/89]
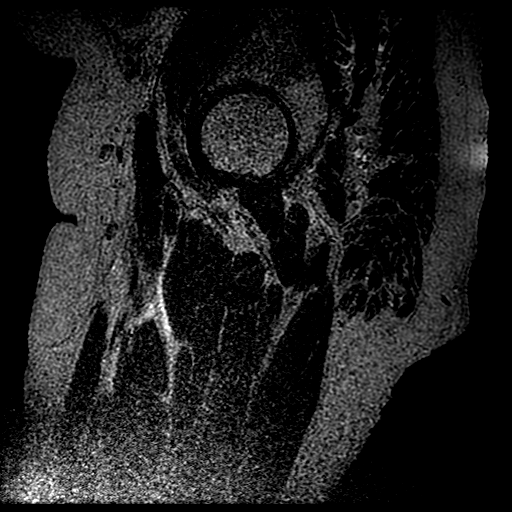
[im 10/89]
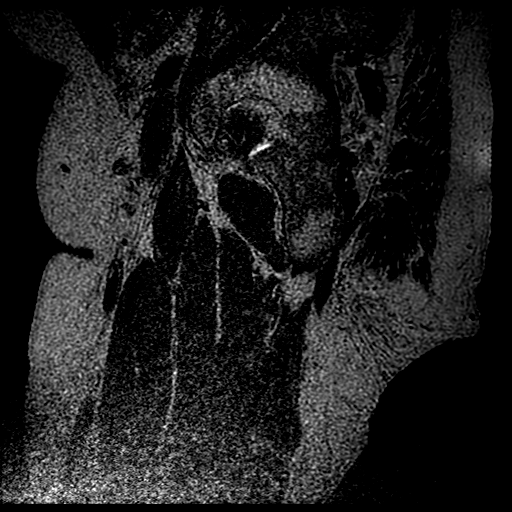
[im 30/89]
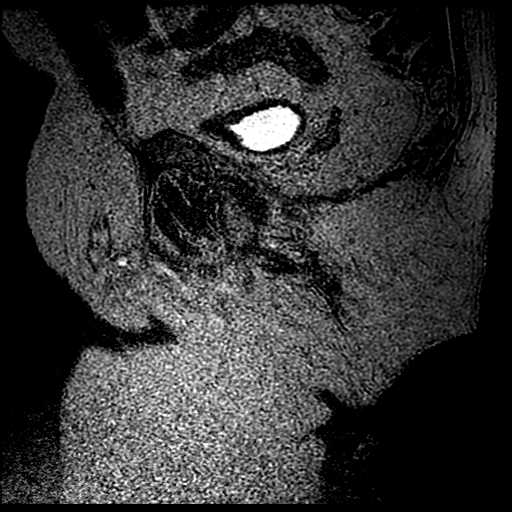
[im 40/89]
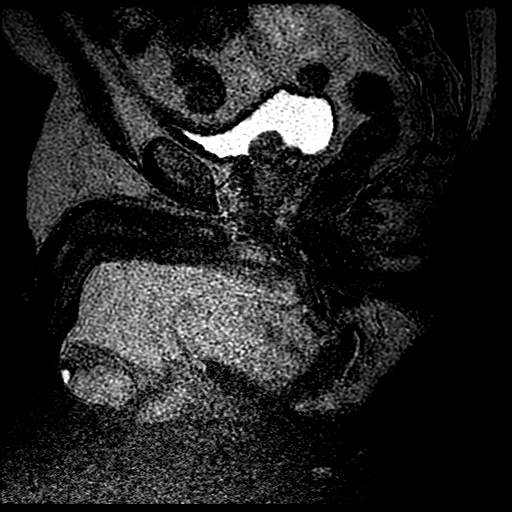
[im 49/89]
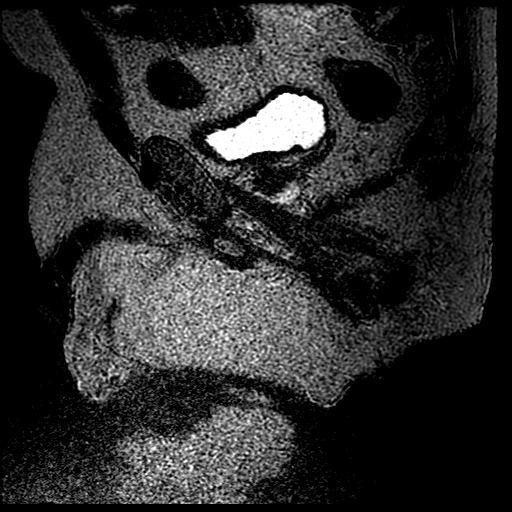
[im 59/89]
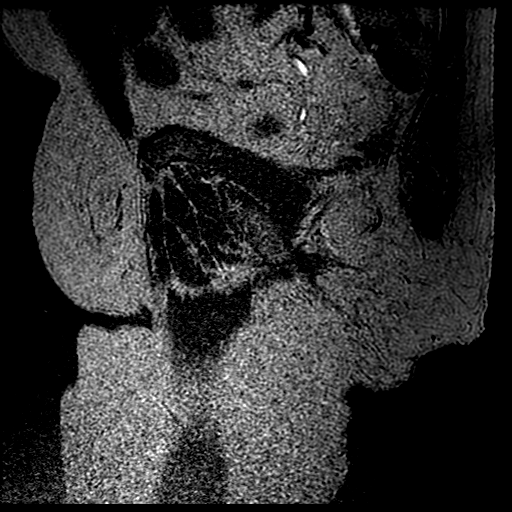
[im 79/89]
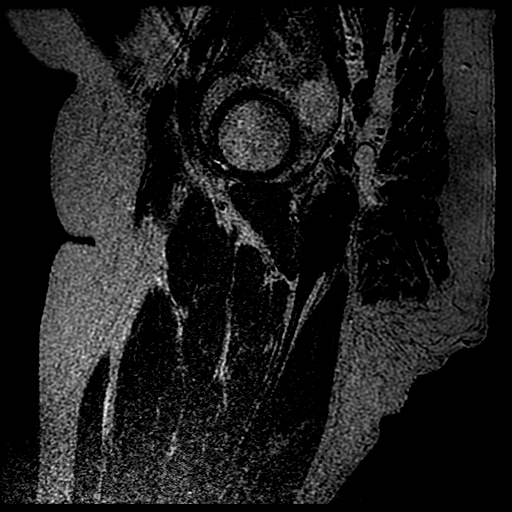
[im 89/89]
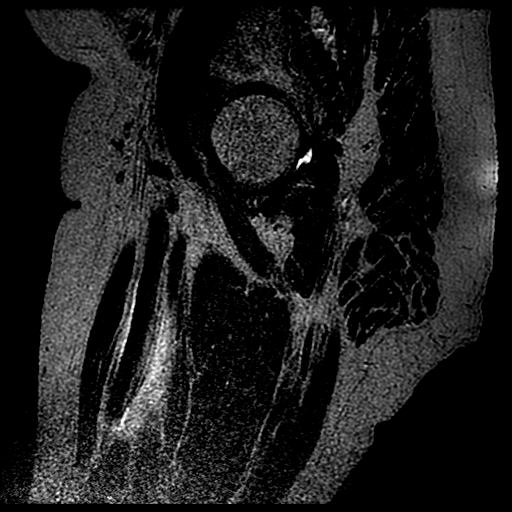

[Series 6: T2 fat-sat · sagittal · 2.0mm · 0.51mm/px · 4 of 89 slices shown]
[im 1/89]
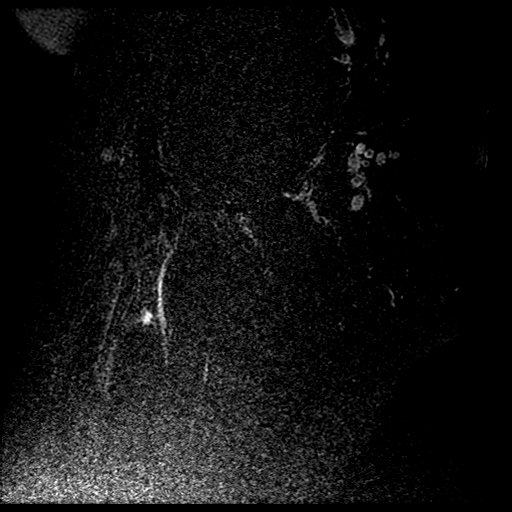
[im 10/89]
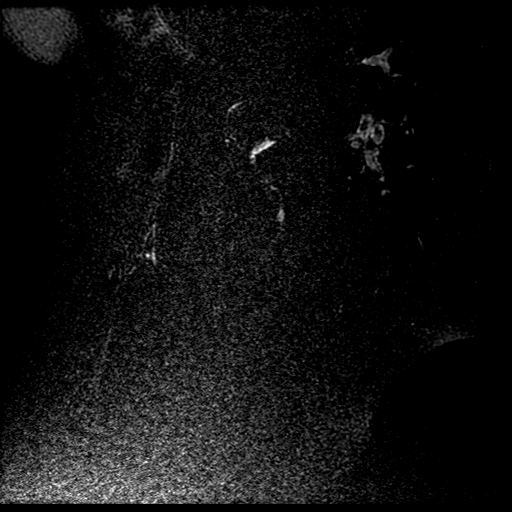
[im 49/89]
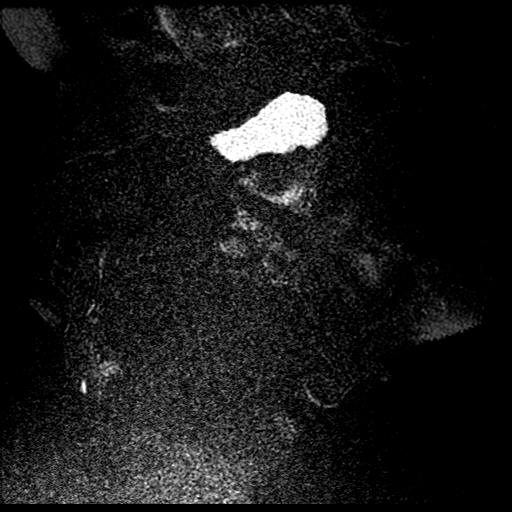
[im 79/89]
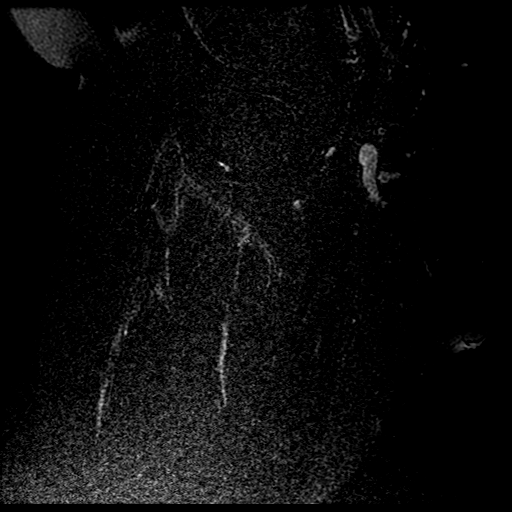

[Series 7: T1 · axial · 4.0mm · 0.43mm/px · z∈[-134,+70]mm · 3 of 43 slices shown]
[im 1/43]
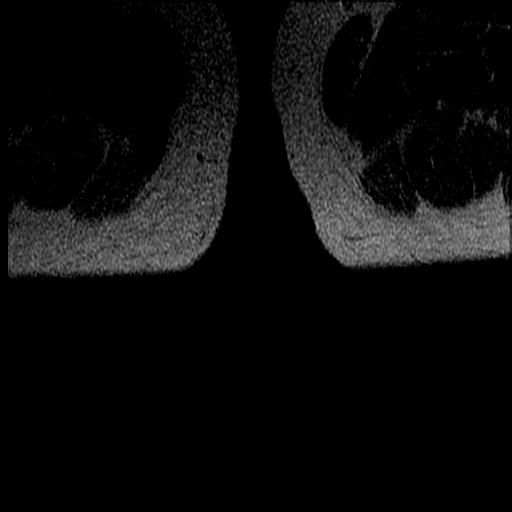
[im 22/43]
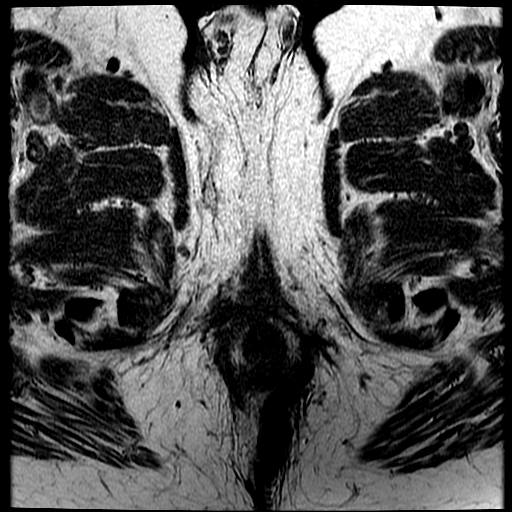
[im 43/43]
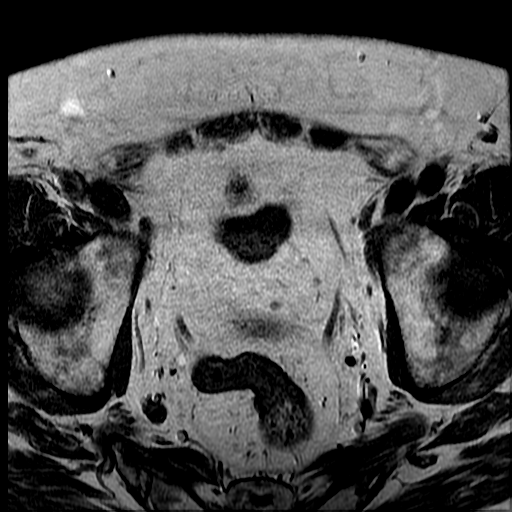

[Series 8: T2 · axial · 4.0mm · 0.43mm/px · z∈[-134,+70]mm · 3 of 44 slices shown (2 of 2)]
[im 1/44]
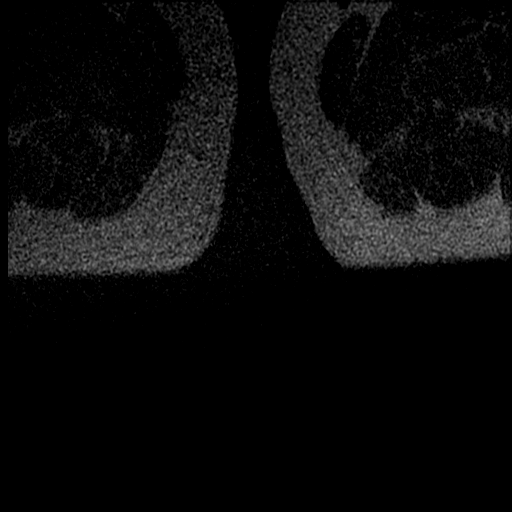
[im 22/44]
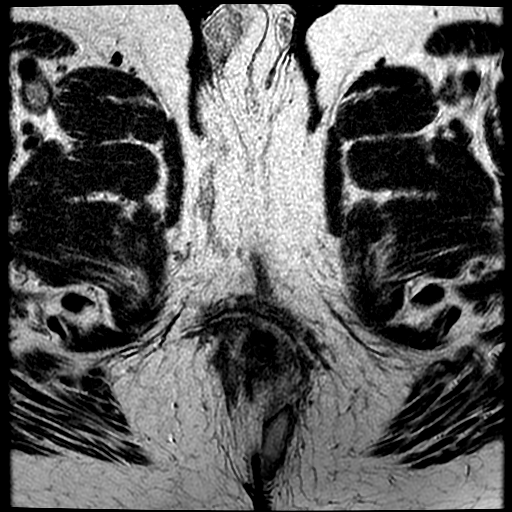
[im 44/44]
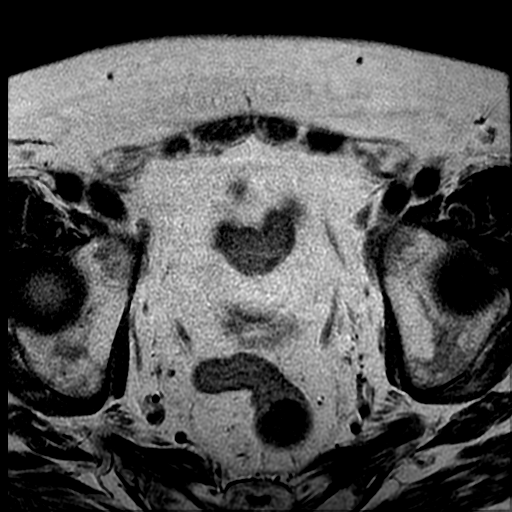

[18 of 48 positions shown; findings below may reference images not displayed]

FINDINGS: Reproductive: Mild nodular hypertrophy of the median lobe of the
prostate. Prostate dimensions 4.6 x 4.2 x 4.3 cm (volume = 43
cm^3).

Bladder: Collapsed and grossly normal bladder. Normal visualized
urethra.

Bowel: There is a complex grade 4 Jau classification
transsphincteric perianal fistula originating between 5 and 6
o'clock in the posterior anal wall (series 8/ image 20), with
adjacent posterior intersphincteric space 1.1 x 0.7 x 1.3 cm gas
containing abscess (series 12/ image 21), and with complex
"horseshoe" inflammatory involvement of the left and anterior
ischiorectal fossa, including an approximately 5 cm in length
curvilinear fistula extending to the skin surface in the medial left
gluteal fold, and a separate approximately 6 cm in length
curvilinear fistula extending to the skin surface in the right
perineum. Both of the cutaneous fistula tracts originating from the
ischiorectal fossa demonstrate thick enhancing walls and are
distended with pus up to 11 mm diameter. There is no involvement of
the supralevator space. The visualized rectum appears normal.

Vascular/Lymphatic: No pathologically enlarged lymph nodes in the
pelvis.

Other: No abnormal free fluid in the pelvis.

Musculoskeletal: No aggressive appearing focal osseous lesions.
IMPRESSION: 1. Complex grade 4 Tiger classification transsphincteric perianal
fistula originating between 5 and 6 o'clock in the posterior anal
wall, with associated small posterior intersphincteric space
gas-containing abscess and bilateral pus-filled cutaneous fistula
tracts originating from the ischiorectal fossa extending to the
right perineum and medial left gluteal fold.
2. Mildly enlarged prostate due to mild nodular hypertrophy of the
median lobe of the prostate.

## 2017-10-12 DIAGNOSIS — R7301 Impaired fasting glucose: Secondary | ICD-10-CM | POA: Diagnosis not present

## 2017-10-12 DIAGNOSIS — Z125 Encounter for screening for malignant neoplasm of prostate: Secondary | ICD-10-CM | POA: Diagnosis not present

## 2017-10-12 DIAGNOSIS — E7849 Other hyperlipidemia: Secondary | ICD-10-CM | POA: Diagnosis not present

## 2017-10-12 DIAGNOSIS — Z0000 Encounter for general adult medical examination without abnormal findings: Secondary | ICD-10-CM | POA: Diagnosis not present

## 2017-10-12 DIAGNOSIS — I10 Essential (primary) hypertension: Secondary | ICD-10-CM | POA: Diagnosis not present

## 2017-10-12 DIAGNOSIS — Z1389 Encounter for screening for other disorder: Secondary | ICD-10-CM | POA: Diagnosis not present

## 2017-10-22 DIAGNOSIS — Z1212 Encounter for screening for malignant neoplasm of rectum: Secondary | ICD-10-CM | POA: Diagnosis not present

## 2017-11-07 ENCOUNTER — Telehealth: Payer: Self-pay | Admitting: Gastroenterology

## 2017-11-07 ENCOUNTER — Other Ambulatory Visit: Payer: Self-pay

## 2017-11-07 DIAGNOSIS — K50113 Crohn's disease of large intestine with fistula: Secondary | ICD-10-CM

## 2017-11-07 NOTE — Telephone Encounter (Signed)
Patient reports this time the discomfort with minimal drainage started last week about 4/18/ or 4/19. This one "burst" and drained yesterday. He is pain free today. He has an appointment for general follow up  With you in June. He asks if he would benefit from prednisone. He emphasizes that he is "100%" better.

## 2017-11-07 NOTE — Telephone Encounter (Signed)
Please check CBC, CMP, CRP, B12, ferritin and TB quantiferon. If he has no further drainage and no diarrhea or BRBPR, can continue to monitor and follow up in office visit. Thanks

## 2017-11-09 ENCOUNTER — Other Ambulatory Visit: Payer: Self-pay | Admitting: Gastroenterology

## 2017-11-20 DIAGNOSIS — K5090 Crohn's disease, unspecified, without complications: Secondary | ICD-10-CM | POA: Diagnosis not present

## 2017-11-22 ENCOUNTER — Other Ambulatory Visit (INDEPENDENT_AMBULATORY_CARE_PROVIDER_SITE_OTHER): Payer: 59

## 2017-11-22 DIAGNOSIS — K50113 Crohn's disease of large intestine with fistula: Secondary | ICD-10-CM

## 2017-11-22 LAB — CBC WITH DIFFERENTIAL/PLATELET
Basophils Absolute: 0 10*3/uL (ref 0.0–0.1)
Basophils Relative: 0.8 % (ref 0.0–3.0)
EOS PCT: 3.8 % (ref 0.0–5.0)
Eosinophils Absolute: 0.2 10*3/uL (ref 0.0–0.7)
HCT: 43 % (ref 39.0–52.0)
HEMOGLOBIN: 15.1 g/dL (ref 13.0–17.0)
LYMPHS ABS: 0.6 10*3/uL — AB (ref 0.7–4.0)
Lymphocytes Relative: 12.6 % (ref 12.0–46.0)
MCHC: 35.1 g/dL (ref 30.0–36.0)
MCV: 100.8 fl — ABNORMAL HIGH (ref 78.0–100.0)
MONO ABS: 0.7 10*3/uL (ref 0.1–1.0)
Monocytes Relative: 14.2 % — ABNORMAL HIGH (ref 3.0–12.0)
NEUTROS PCT: 68.6 % (ref 43.0–77.0)
Neutro Abs: 3.5 10*3/uL (ref 1.4–7.7)
Platelets: 201 10*3/uL (ref 150.0–400.0)
RBC: 4.27 Mil/uL (ref 4.22–5.81)
RDW: 13.6 % (ref 11.5–15.5)
WBC: 5.1 10*3/uL (ref 4.0–10.5)

## 2017-11-22 LAB — COMPREHENSIVE METABOLIC PANEL
ALBUMIN: 4.1 g/dL (ref 3.5–5.2)
ALK PHOS: 33 U/L — AB (ref 39–117)
ALT: 37 U/L (ref 0–53)
AST: 24 U/L (ref 0–37)
BILIRUBIN TOTAL: 0.9 mg/dL (ref 0.2–1.2)
BUN: 24 mg/dL — AB (ref 6–23)
CO2: 26 mEq/L (ref 19–32)
Calcium: 9.1 mg/dL (ref 8.4–10.5)
Chloride: 104 mEq/L (ref 96–112)
Creatinine, Ser: 1.21 mg/dL (ref 0.40–1.50)
GFR: 65.16 mL/min (ref >60.00)
GLUCOSE: 102 mg/dL — AB (ref 70–99)
Potassium: 4 mEq/L (ref 3.5–5.1)
SODIUM: 141 meq/L (ref 135–145)
TOTAL PROTEIN: 7.7 g/dL (ref 6.0–8.3)

## 2017-11-22 LAB — FERRITIN: Ferritin: 193 ng/mL (ref 22.0–322.0)

## 2017-11-22 LAB — VITAMIN B12: VITAMIN B 12: 325 pg/mL (ref 211–911)

## 2017-11-22 LAB — C-REACTIVE PROTEIN: CRP: 0.3 mg/dL — AB (ref 0.5–20.0)

## 2017-11-24 LAB — QUANTIFERON-TB GOLD PLUS
Mitogen-NIL: 5.16 IU/mL
NIL: 0.02 [IU]/mL
QUANTIFERON-TB GOLD PLUS: NEGATIVE
TB1-NIL: 0 IU/mL
TB2-NIL: 0 IU/mL

## 2017-11-26 NOTE — Telephone Encounter (Signed)
-----   Message from Mauri Pole, MD sent at 11/26/2017  2:24 PM EDT ----- TB negative. Please inform patient the results. Thanks

## 2017-11-26 NOTE — Telephone Encounter (Signed)
Notified by phone of negative TB test.

## 2017-12-11 ENCOUNTER — Encounter: Payer: Self-pay | Admitting: Gastroenterology

## 2017-12-11 ENCOUNTER — Ambulatory Visit: Payer: 59 | Admitting: Gastroenterology

## 2017-12-11 VITALS — BP 138/82 | Ht 71.0 in | Wt 253.6 lb

## 2017-12-11 DIAGNOSIS — R972 Elevated prostate specific antigen [PSA]: Secondary | ICD-10-CM | POA: Diagnosis not present

## 2017-12-11 DIAGNOSIS — K50113 Crohn's disease of large intestine with fistula: Secondary | ICD-10-CM | POA: Diagnosis not present

## 2017-12-11 DIAGNOSIS — E7849 Other hyperlipidemia: Secondary | ICD-10-CM | POA: Diagnosis not present

## 2017-12-11 DIAGNOSIS — I10 Essential (primary) hypertension: Secondary | ICD-10-CM | POA: Diagnosis not present

## 2017-12-11 NOTE — Progress Notes (Signed)
Jesse English    [id number]    September 24, 1960  Primary Care Physician:Shaw, Gwyndolyn Saxon, MD  Referring Physician: Marton Redwood, MD [address] [place], [place] [address]  Chief complaint: Crohn's colitis  HPI: 57 year old male with history of Crohn's colitis complicated by perirectal abscesses and complex transsphincteric perianal fistula, diagnosed in 2017.  In clinical remission on Entyvio since 2018.  History of C. difficile colitis in April 2017 status post vancomycin.  DVT/PE status post 6 months of anticoagulation. He has not had any rectal or perianal drainage since April 2018 when MRI pelvis showed persistent complex transsphincteric perianal fistula and a cutaneous fistula with no drainable abscess.  Occasionally the cutaneous fistula in the gluteus opens and drains small amount of fluid once every few months.  Denies any tenderness, abscess collection, redness, fever or chills. Is having regular 1-2 soft formed bowel movements daily with no rectal bleeding or mucus. Overall doing well and has no specific GI complaints He feels better since he started taking oral B12 and iron, no longer has fatigue.    Outpatient Encounter Medications as of 12/11/2017  Medication Sig  . hydrochlorothiazide (HYDRODIURIL) 12.5 MG tablet Take 25 mg by mouth daily.   . mercaptopurine (PURINETHOL) 50 MG tablet TAKE 1 TABLET BY MOUTH ONCE DAILY ON AN EMPTY STOMACH 1 HOUR BEFORE OR 2 HOURS AFTER A MEAL  . [DISCONTINUED] mercaptopurine (PURINETHOL) 50 MG tablet TAKE 1 TABLET BY MOUTH DAILY ON AN EMPTY STOMACH 1 HOUR BEFORE OR 2 HOURS AFTER A MEAL. CAUTION CHEMOTHERAPY (Patient not taking: Reported on 12/11/2017)   Facility-Administered Encounter Medications as of 12/11/2017  Medication  . 0.9 %  sodium chloride infusion    Allergies as of 12/11/2017 - Review Complete 04/03/2017  Allergen Reaction Noted  . Ace inhibitors Swelling 09/23/2015    Past Medical History:  Diagnosis Date  .  Colon polyps    2013  . Crohn's colitis ([place])    vs UC with abscess  . Diverticulosis   . Fournier's gangrene in male 04/2016  . Gout   . Hypertension   . Perirectal abscess 04/2016  . Prolapsed internal hemorrhoid, grade 4   . Pulmonary embolism ([place])   . Sleep apnea    cpap    Past Surgical History:  Procedure Laterality Date  . ANAL FISSURE REPAIR    . COLONOSCOPY    . HEMORRHOID SURGERY  04/21/2016   Procedure: HEMORRHOIDECTOMY;  Surgeon: Michael Boston, MD;  Location: [facility];  Service: General;;  . INCISION AND DRAINAGE ABSCESS N/A 04/21/2016   Procedure: INCISION AND DRAINAGE OF HORSESHOE  FOURNIER GANGRENE PERIRECTAL ABSCESS;  Surgeon: Michael Boston, MD;  Location: [facility];  Service: General;  Laterality: N/A;    Family History  Problem Relation Age of Onset  . Pneumonia Mother   . Pneumonia Father   . Colon cancer Neg Hx   . Esophageal cancer Neg Hx   . Stomach cancer Neg Hx   . Rectal cancer Neg Hx     Social History   Socioeconomic History  . Marital status: Married    Spouse name: Not on file  . Number of children: 2  . Years of education: Not on file  . Highest education level: Not on file  Occupational History  . Occupation: Geographical information systems officer  . Financial resource strain: Not on file  . Food insecurity:    Worry: Not on file    Inability: Not on  file  . Transportation needs:    Medical: Not on file    Non-medical: Not on file  Tobacco Use  . Smoking status: Former Smoker    Types: Cigars    Last attempt to quit: 07/10/2013    Years since quitting: 4.4  . Smokeless tobacco: Never Used  Substance and Sexual Activity  . Alcohol use: No    Alcohol/week: 8.4 oz    Types: 14 Cans of beer per week    Comment: 2 per day  . Drug use: No  . Sexual activity: Not on file  Lifestyle  . Physical activity:    Days per week: Not on file    Minutes per session: Not on file  . Stress: Not on file  Relationships  . Social connections:    Talks on phone:  Not on file    Gets together: Not on file    Attends religious service: Not on file    Active member of club or organization: Not on file    Attends meetings of clubs or organizations: Not on file    Relationship status: Not on file  . Intimate partner violence:    Fear of current or ex partner: Not on file    Emotionally abused: Not on file    Physically abused: Not on file    Forced sexual activity: Not on file  Other Topics Concern  . Not on file  Social History Narrative  . Not on file      Review of systems: Review of Systems  Constitutional: Negative for fever and chills.  HENT: Negative.   Eyes: Negative for blurred vision.  Respiratory: Negative for cough, shortness of breath and wheezing.   Cardiovascular: Negative for chest pain and palpitations.  Gastrointestinal: as per HPI Genitourinary: Negative for dysuria, urgency, frequency and hematuria.  Musculoskeletal: Negative for myalgias, back pain and joint pain.  Skin: Negative for itching and rash.  Neurological: Negative for dizziness, tremors, focal weakness, seizures and loss of consciousness.  Endo/Heme/Allergies: Positive for seasonal allergies.  Psychiatric/Behavioral: Negative for depression, suicidal ideas and hallucinations.  All other systems reviewed and are negative.   Physical Exam: Vitals:   12/11/17 0845  BP: 138/82   Body mass index is 35.37 kg/m. Gen:      No acute distress HEENT:  EOMI, sclera anicteric Neck:     No masses; no thyromegaly Lungs:    Clear to auscultation bilaterally; normal respiratory effort CV:         Regular rate and rhythm; no murmurs Abd:      + bowel sounds; soft, non-tender; no palpable masses, no distension Ext:    No edema; adequate peripheral perfusion Skin:      Warm and dry; no rash Neuro: alert and oriented x 3 Psych: normal mood and affect  Data Reviewed:  Reviewed labs, radiology imaging, old records and pertinent past GI work up   Assessment and  Plan/Recommendations:  57 year old male with history of inflammatory bowel disease, Crohn's colitis complicated with complex transsphincteric fistula and perirectal abscess, and clinical remission since 2018 on Entyvio maintenance. Colonoscopy July 2018 with evidence of no active inflammation Will discontinue 6-MP B12 deficiency on oral B12 supplements, if remains low will consider switching to injections of B12 Will recheck CBC, B12 and ferritin level in 3 months Return in 3 months or sooner if needed  25 minutes was spent face-to-face with the patient. Greater than 50% of the time used for counseling as well as treatment  plan and follow-up. She had multiple questions which were answered to her satisfaction  K. Denzil Magnuson , MD [phone]    CC: Marton Redwood, MD

## 2017-12-11 NOTE — Patient Instructions (Signed)
Discontinue 6MP  Continue taking you B12  Follow up in 3 months Call back for a schedule in September  If you are age 59 or older, your body mass index should be between 23-30. Your Body mass index is 35.37 kg/m. If this is out of the aforementioned range listed, please consider follow up with your Primary Care Provider.  If you are age 86 or younger, your body mass index should be between 19-25. Your Body mass index is 35.37 kg/m. If this is out of the aformentioned range listed, please consider follow up with your Primary Care Provider.

## 2017-12-13 ENCOUNTER — Encounter: Payer: Self-pay | Admitting: Gastroenterology

## 2017-12-19 ENCOUNTER — Ambulatory Visit: Payer: 59 | Admitting: Gastroenterology

## 2018-01-22 DIAGNOSIS — K5090 Crohn's disease, unspecified, without complications: Secondary | ICD-10-CM | POA: Diagnosis not present

## 2018-01-30 DIAGNOSIS — R972 Elevated prostate specific antigen [PSA]: Secondary | ICD-10-CM | POA: Diagnosis not present

## 2018-02-04 DIAGNOSIS — R972 Elevated prostate specific antigen [PSA]: Secondary | ICD-10-CM | POA: Diagnosis not present

## 2018-02-14 ENCOUNTER — Other Ambulatory Visit: Payer: Self-pay | Admitting: Urology

## 2018-02-27 DIAGNOSIS — M6281 Muscle weakness (generalized): Secondary | ICD-10-CM | POA: Diagnosis not present

## 2018-02-27 DIAGNOSIS — M62838 Other muscle spasm: Secondary | ICD-10-CM | POA: Diagnosis not present

## 2018-03-05 DIAGNOSIS — M62838 Other muscle spasm: Secondary | ICD-10-CM | POA: Diagnosis not present

## 2018-03-05 DIAGNOSIS — M6281 Muscle weakness (generalized): Secondary | ICD-10-CM | POA: Diagnosis not present

## 2018-03-12 NOTE — Patient Instructions (Signed)
Jesse English  03/12/2018   Your procedure is scheduled on: Monday  03/18/2018  Report to [facility] Main  Entrance              Report to admitting at  [facility]  AM    Call this number if you have problems the morning of surgery [phone]    Remember: Do not eat food :After Midnight on Saturday 03/16/2018! You will be on a clear liquid diet and following a bowel prep on Sunday 03/17/2018!   Follow the bowel prep instructions from Dr. Lynne Logan office on Sunday 03/17/2018!             Drink 8 ounces of Magnesium citrate at noon day before surgery on Sunday 03/17/2018.             Use a Fleet's enema the night before surgery!    CLEAR LIQUID DIET   Foods Allowed                                                                     Foods Excluded  Coffee and tea, regular and decaf                             liquids that you cannot  Plain Jell-O in any flavor                                             see through such as: Fruit ices (not with fruit pulp)                                     milk, soups, orange juice  Iced Popsicles                                    All solid food Carbonated beverages, regular and diet                                    Cranberry, grape and apple juices Sports drinks like Gatorade Lightly seasoned clear broth or consume(fat free) Sugar, honey syrup  Sample Menu Breakfast                                Lunch                                     Supper Cranberry juice                    Beef broth  Chicken broth Jell-O                                     Grape juice                           Apple juice Coffee or tea                        Jell-O                                      Popsicle                                                Coffee or tea                        Coffee or tea  _____________________________________________________________________              Do not  drink any liquids  after midnight on Sunday 03/17/2018!   Take these medicines the morning of surgery with A SIP OF WATER: none                                 You may not have any metal on your body including hair pins and              piercings  Do not wear jewelry, make-up, lotions, powders or perfumes, deodorant                        Men may shave face and neck.   Do not bring valuables to the hospital. [place].  Contacts, dentures or bridgework may not be worn into surgery.  Leave suitcase in the car. After surgery it may be brought to your room.                  Please read over the following fact sheets you were given: _____________________________________________________________________             [facility] - Preparing for Surgery Before surgery, you can play an important role.  Because skin is not sterile, your skin needs to be as free of germs as possible.  You can reduce the number of germs on your skin by washing with CHG (chlorahexidine gluconate) soap before surgery.  CHG is an antiseptic cleaner which kills germs and bonds with the skin to continue killing germs even after washing. Please DO NOT use if you have an allergy to CHG or antibacterial soaps.  If your skin becomes reddened/irritated stop using the CHG and inform your nurse when you arrive at Short Stay. Do not shave (including legs and underarms) for at least 48 hours prior to the first CHG shower.  You may shave your face/neck. Please follow these instructions carefully:  1.  Shower with CHG Soap the night before surgery and the  morning of Surgery.  2.  If you choose to wash your hair, wash your hair first as usual with your  normal  shampoo.  3.  After you shampoo, rinse your hair and body thoroughly to remove the  shampoo.                           4.  Use CHG as you would any other liquid soap.  You can apply chg directly  to the skin and wash                        Gently with a scrungie or clean washcloth.  5.  Apply the CHG Soap to your body ONLY FROM THE NECK DOWN.   Do not use on face/ open                           Wound or open sores. Avoid contact with eyes, ears mouth and genitals (private parts).                       Wash face,  Genitals (private parts) with your normal soap.             6.  Wash thoroughly, paying special attention to the area where your surgery  will be performed.  7.  Thoroughly rinse your body with warm water from the neck down.  8.  DO NOT shower/wash with your normal soap after using and rinsing off  the CHG Soap.                9.  Pat yourself dry with a clean towel.            10.  Wear clean pajamas.            11.  Place clean sheets on your bed the night of your first shower and do not  sleep with pets. Day of Surgery : Do not apply any lotions/deodorants the morning of surgery.  Please wear clean clothes to the hospital/surgery center.  FAILURE TO FOLLOW THESE INSTRUCTIONS MAY RESULT IN THE CANCELLATION OF YOUR SURGERY PATIENT SIGNATURE_________________________________  NURSE SIGNATURE__________________________________  ________________________________________________________________________

## 2018-03-12 NOTE — Progress Notes (Signed)
08/30/2016- noted in Epic-ECHO

## 2018-03-14 ENCOUNTER — Encounter (HOSPITAL_COMMUNITY)
Admission: RE | Admit: 2018-03-14 | Discharge: 2018-03-14 | Disposition: A | Discharge status: Home / Self Care | Payer: 59 | Source: Ambulatory Visit | Attending: Urology | Admitting: Urology

## 2018-03-14 ENCOUNTER — Other Ambulatory Visit: Payer: Self-pay

## 2018-03-14 ENCOUNTER — Encounter (HOSPITAL_COMMUNITY): Payer: Self-pay

## 2018-03-14 DIAGNOSIS — C61 Malignant neoplasm of prostate: Secondary | ICD-10-CM | POA: Diagnosis not present

## 2018-03-14 DIAGNOSIS — Z01812 Encounter for preprocedural laboratory examination: Secondary | ICD-10-CM | POA: Insufficient documentation

## 2018-03-14 DIAGNOSIS — Z01810 Encounter for preprocedural cardiovascular examination: Secondary | ICD-10-CM | POA: Insufficient documentation

## 2018-03-14 DIAGNOSIS — I10 Essential (primary) hypertension: Secondary | ICD-10-CM | POA: Diagnosis not present

## 2018-03-14 LAB — BASIC METABOLIC PANEL
Anion gap: 11 (ref 5–15)
BUN: 23 mg/dL — AB (ref 6–20)
CHLORIDE: 103 mmol/L (ref 98–111)
CO2: 29 mmol/L (ref 22–32)
CREATININE: 1.25 mg/dL — AB (ref 0.61–1.24)
Calcium: 9.3 mg/dL (ref 8.9–10.3)
GFR calc Af Amer: >60 mL/min (ref >60)
GFR calc non Af Amer: >60 mL/min (ref >60)
GLUCOSE: 119 mg/dL — AB (ref 70–99)
POTASSIUM: 4.3 mmol/L (ref 3.5–5.1)
SODIUM: 143 mmol/L (ref 135–145)

## 2018-03-14 LAB — CBC
HEMATOCRIT: 44.7 % (ref 39.0–52.0)
Hemoglobin: 15.3 g/dL (ref 13.0–17.0)
MCH: 33.8 pg (ref 26.0–34.0)
MCHC: 34.2 g/dL (ref 30.0–36.0)
MCV: 98.7 fL (ref 78.0–100.0)
PLATELETS: 180 10*3/uL (ref 150–400)
RBC: 4.53 MIL/uL (ref 4.22–5.81)
RDW: 12.5 % (ref 11.5–15.5)
WBC: 5.9 10*3/uL (ref 4.0–10.5)

## 2018-03-14 NOTE — Patient Instructions (Signed)
Jesse English  03/14/2018   Your procedure is scheduled on: 03-18-18   Report to [facility] Main  Entrance    Report to Admitting at 5:30 [place]                                                                     Foods Excluded  Coffee and tea, regular and decaf                             liquids that you cannot  Plain Jell-O in any flavor                                             see through such as: Fruit ices (not with fruit pulp)                                     milk, soups, orange juice  Iced Popsicles                                    All solid food Carbonated beverages, regular and diet                                    Cranberry, grape and apple juices Sports drinks like Gatorade Lightly seasoned clear broth or consume(fat free) Sugar, honey syrup  Sample Menu Breakfast                                Lunch                                     Supper Cranberry juice                    Beef broth                            Chicken broth Jell-O                                     Grape juice                           Apple juice Coffee or tea                        Jell-O  Popsicle                                                Coffee or tea                        Coffee or tea  _____________________________________________________________________    Call this number if you have problems the morning of surgery [phone]   Remember: Do not eat food or drink liquids :After Midnight.     Take these medicines the morning of surgery with A SIP OF WATER: Crestor (Atorvastatin)                                 You may not have any metal on your body including hair pins and              piercings  Do not wear jewelry, lotions, cologne, powders or deodorant             Men may shave face and neck.   Do not bring valuables to the hospital. [place].  Contacts, dentures or bridgework may not be worn into surgery.  Leave suitcase in the car. After surgery it may be brought to your room.       Special Instructions: Please bring your mask and tubing for your CPAP machine. Also please consume a Clear Liquid Diet with your prep, per your surgeon's instructions.               Please read over the following fact sheets you were given: _____________________________________________________________________             [facility] - Preparing for Surgery Before surgery, you can play an important role.  Because skin is not sterile, your skin needs to be as free of germs as possible.  You can reduce the number of germs on your skin by washing with CHG (chlorahexidine gluconate) soap before surgery.  CHG is an antiseptic cleaner which kills germs and bonds with the skin to continue killing germs even after washing. Please DO NOT use if you have an allergy to CHG or antibacterial soaps.  If your skin becomes reddened/irritated stop using the CHG and inform your nurse when you arrive at Short Stay. Do not shave (including legs and underarms) for at least 48 hours prior to the first CHG shower.  You may shave your face/neck. Please follow these instructions carefully:  1.  Shower with CHG Soap the night before surgery and the  morning of Surgery.  2.  If you choose to wash your hair, wash your hair first as usual with your  normal  shampoo.  3.  After you shampoo, rinse your hair and body thoroughly to remove the  shampoo.                           4.  Use CHG as you would any other liquid soap.  You can apply chg directly  to the skin and wash                       Gently with a scrungie or clean washcloth.  5.  Apply the CHG  Soap to your body ONLY FROM THE NECK DOWN.   Do not use on face/ open                           Wound or open sores. Avoid contact with eyes, ears mouth and genitals (private parts).                        Wash face,  Genitals (private parts) with your normal soap.             6.  Wash thoroughly, paying special attention to the area where your surgery  will be performed.  7.  Thoroughly rinse your body with warm water from the neck down.  8.  DO NOT shower/wash with your normal soap after using and rinsing off  the CHG Soap.                9.  Pat yourself dry with a clean towel.            10.  Wear clean pajamas.            11.  Place clean sheets on your bed the night of your first shower and do not  sleep with pets. Day of Surgery : Do not apply any lotions/deodorants the morning of surgery.  Please wear clean clothes to the hospital/surgery center.  FAILURE TO FOLLOW THESE INSTRUCTIONS MAY RESULT IN THE CANCELLATION OF YOUR SURGERY PATIENT SIGNATURE_________________________________  NURSE SIGNATURE__________________________________  ________________________________________________________________________

## 2018-03-15 ENCOUNTER — Inpatient Hospital Stay (HOSPITAL_COMMUNITY)
Admission: RE | Admit: 2018-03-15 | Discharge: 2018-03-15 | Disposition: A | Discharge status: Home / Self Care | Payer: 59 | Source: Ambulatory Visit

## 2018-03-18 ENCOUNTER — Ambulatory Visit (HOSPITAL_COMMUNITY): Payer: 59 | Admitting: Certified Registered Nurse Anesthetist

## 2018-03-18 ENCOUNTER — Other Ambulatory Visit: Payer: Self-pay

## 2018-03-18 ENCOUNTER — Observation Stay (HOSPITAL_COMMUNITY)
Admission: RE | Admit: 2018-03-18 | Discharge: 2018-03-19 | Disposition: A | Discharge status: Home / Self Care | Payer: 59 | Source: Ambulatory Visit | Attending: Urology | Admitting: Urology

## 2018-03-18 ENCOUNTER — Encounter (HOSPITAL_COMMUNITY): Payer: Self-pay

## 2018-03-18 ENCOUNTER — Encounter (HOSPITAL_COMMUNITY)
Admission: RE | Disposition: A | Discharge status: Home / Self Care | Payer: Self-pay | Source: Ambulatory Visit | Attending: Urology

## 2018-03-18 DIAGNOSIS — I10 Essential (primary) hypertension: Secondary | ICD-10-CM | POA: Insufficient documentation

## 2018-03-18 DIAGNOSIS — Z87891 Personal history of nicotine dependence: Secondary | ICD-10-CM | POA: Diagnosis not present

## 2018-03-18 DIAGNOSIS — Z79899 Other long term (current) drug therapy: Secondary | ICD-10-CM | POA: Insufficient documentation

## 2018-03-18 DIAGNOSIS — M109 Gout, unspecified: Secondary | ICD-10-CM | POA: Insufficient documentation

## 2018-03-18 DIAGNOSIS — K5090 Crohn's disease, unspecified, without complications: Secondary | ICD-10-CM | POA: Diagnosis not present

## 2018-03-18 DIAGNOSIS — Z8619 Personal history of other infectious and parasitic diseases: Secondary | ICD-10-CM | POA: Diagnosis not present

## 2018-03-18 DIAGNOSIS — G4730 Sleep apnea, unspecified: Secondary | ICD-10-CM | POA: Diagnosis not present

## 2018-03-18 DIAGNOSIS — K429 Umbilical hernia without obstruction or gangrene: Secondary | ICD-10-CM | POA: Insufficient documentation

## 2018-03-18 DIAGNOSIS — Z86711 Personal history of pulmonary embolism: Secondary | ICD-10-CM | POA: Diagnosis not present

## 2018-03-18 DIAGNOSIS — C61 Malignant neoplasm of prostate: Principal | ICD-10-CM | POA: Insufficient documentation

## 2018-03-18 HISTORY — PX: ROBOT ASSISTED LAPAROSCOPIC RADICAL PROSTATECTOMY: SHX5141

## 2018-03-18 HISTORY — PX: LYMPHADENECTOMY: SHX5960

## 2018-03-18 LAB — TYPE AND SCREEN
ABO/RH(D): O POS
Antibody Screen: NEGATIVE

## 2018-03-18 LAB — HEMOGLOBIN AND HEMATOCRIT, BLOOD
HCT: 45.9 % (ref 39.0–52.0)
HEMOGLOBIN: 15.6 g/dL (ref 13.0–17.0)

## 2018-03-18 SURGERY — XI ROBOTIC ASSISTED LAPAROSCOPIC RADICAL PROSTATECTOMY LEVEL 2
Anesthesia: General

## 2018-03-18 MED ORDER — PHENYLEPHRINE 40 MCG/ML (10ML) SYRINGE FOR IV PUSH (FOR BLOOD PRESSURE SUPPORT)
PREFILLED_SYRINGE | INTRAVENOUS | Status: AC
  Filled 2018-03-18: qty 10

## 2018-03-18 MED ORDER — HEPARIN SODIUM (PORCINE) 5000 UNIT/ML IJ SOLN
5000.0000 [IU] | Freq: Once | INTRAMUSCULAR | Status: DC
  Filled 2018-03-18: qty 1

## 2018-03-18 MED ORDER — DIPHENHYDRAMINE HCL 50 MG/ML IJ SOLN: 12.5000 mg | Freq: Four times a day (QID) | INTRAMUSCULAR | Status: DC | PRN

## 2018-03-18 MED ORDER — EPHEDRINE 5 MG/ML INJ
INTRAVENOUS | Status: AC
  Filled 2018-03-18: qty 10

## 2018-03-18 MED ORDER — DIPHENHYDRAMINE HCL 12.5 MG/5ML PO ELIX: 12.5000 mg | ORAL_SOLUTION | Freq: Four times a day (QID) | ORAL | Status: DC | PRN

## 2018-03-18 MED ORDER — SODIUM CHLORIDE 0.9 % IV BOLUS
1000.0000 mL | Freq: Once | INTRAVENOUS | Status: AC
  Administered 2018-03-18: 1000 mL via INTRAVENOUS

## 2018-03-18 MED ORDER — FLEET ENEMA 7-19 GM/118ML RE ENEM: 1.0000 | ENEMA | Freq: Once | RECTAL | Status: DC

## 2018-03-18 MED ORDER — ONDANSETRON HCL 4 MG/2ML IJ SOLN
4.0000 mg | INTRAMUSCULAR | Status: DC | PRN
  Administered 2018-03-18 (×2): 4 mg via INTRAVENOUS
  Filled 2018-03-18 (×3): qty 2

## 2018-03-18 MED ORDER — HYDRALAZINE HCL 20 MG/ML IJ SOLN
INTRAMUSCULAR | Status: DC | PRN
  Administered 2018-03-18 (×2): 5 mg via INTRAVENOUS

## 2018-03-18 MED ORDER — SODIUM CHLORIDE 0.9 % IR SOLN
Status: DC | PRN
  Administered 2018-03-18: 1000 mL via INTRAVESICAL

## 2018-03-18 MED ORDER — TRAMADOL HCL 50 MG PO TABS: 50.0000 mg | ORAL_TABLET | Freq: Four times a day (QID) | ORAL | 0 refills | Status: DC | PRN

## 2018-03-18 MED ORDER — ENOXAPARIN SODIUM 40 MG/0.4ML ~~LOC~~ SOLN
40.0000 mg | SUBCUTANEOUS | Status: DC
  Administered 2018-03-18: 40 mg via SUBCUTANEOUS
  Filled 2018-03-18: qty 0.4

## 2018-03-18 MED ORDER — PROPOFOL 10 MG/ML IV BOLUS
INTRAVENOUS | Status: AC
  Filled 2018-03-18: qty 20

## 2018-03-18 MED ORDER — CEFAZOLIN SODIUM-DEXTROSE 1-4 GM/50ML-% IV SOLN
1.0000 g | Freq: Three times a day (TID) | INTRAVENOUS | Status: AC
  Administered 2018-03-18 (×2): 1 g via INTRAVENOUS
  Filled 2018-03-18 (×2): qty 50

## 2018-03-18 MED ORDER — HYDROCHLOROTHIAZIDE 25 MG PO TABS
25.0000 mg | ORAL_TABLET | Freq: Every day | ORAL | Status: DC
  Administered 2018-03-18 – 2018-03-19 (×2): 25 mg via ORAL
  Filled 2018-03-18 (×2): qty 1

## 2018-03-18 MED ORDER — SULFAMETHOXAZOLE-TRIMETHOPRIM 800-160 MG PO TABS: 1.0000 | ORAL_TABLET | Freq: Two times a day (BID) | ORAL | 0 refills | Status: DC

## 2018-03-18 MED ORDER — MORPHINE SULFATE (PF) 4 MG/ML IV SOLN
2.0000 mg | INTRAVENOUS | Status: DC | PRN
  Administered 2018-03-18: 2 mg via INTRAVENOUS
  Administered 2018-03-18: 4 mg via INTRAVENOUS
  Filled 2018-03-18 (×2): qty 1

## 2018-03-18 MED ORDER — FENTANYL CITRATE (PF) 250 MCG/5ML IJ SOLN
INTRAMUSCULAR | Status: AC
  Filled 2018-03-18: qty 5

## 2018-03-18 MED ORDER — SUGAMMADEX SODIUM 200 MG/2ML IV SOLN
INTRAVENOUS | Status: AC
  Filled 2018-03-18: qty 2

## 2018-03-18 MED ORDER — KETOROLAC TROMETHAMINE 15 MG/ML IJ SOLN
15.0000 mg | Freq: Four times a day (QID) | INTRAMUSCULAR | Status: DC
  Administered 2018-03-18 – 2018-03-19 (×5): 15 mg via INTRAVENOUS
  Filled 2018-03-18 (×5): qty 1

## 2018-03-18 MED ORDER — HEPARIN SODIUM (PORCINE) 5000 UNIT/ML IJ SOLN
INTRAMUSCULAR | Status: DC | PRN
  Administered 2018-03-18: 5000 [IU] via SUBCUTANEOUS

## 2018-03-18 MED ORDER — SUGAMMADEX SODIUM 200 MG/2ML IV SOLN
INTRAVENOUS | Status: DC | PRN
  Administered 2018-03-18: 350 mg via INTRAVENOUS

## 2018-03-18 MED ORDER — ROCURONIUM BROMIDE 50 MG/5ML IV SOSY
PREFILLED_SYRINGE | INTRAVENOUS | Status: DC | PRN
  Administered 2018-03-18: 80 mg via INTRAVENOUS
  Administered 2018-03-18 (×2): 20 mg via INTRAVENOUS

## 2018-03-18 MED ORDER — KCL IN DEXTROSE-NACL 20-5-0.45 MEQ/L-%-% IV SOLN
INTRAVENOUS | Status: DC
  Administered 2018-03-18 – 2018-03-19 (×3): via INTRAVENOUS
  Filled 2018-03-18 (×3): qty 1000

## 2018-03-18 MED ORDER — ONDANSETRON HCL 4 MG/2ML IJ SOLN
INTRAMUSCULAR | Status: AC
  Filled 2018-03-18: qty 2

## 2018-03-18 MED ORDER — CEFAZOLIN SODIUM-DEXTROSE 2-4 GM/100ML-% IV SOLN
2.0000 g | Freq: Once | INTRAVENOUS | Status: AC
  Administered 2018-03-18: 2 g via INTRAVENOUS
  Filled 2018-03-18: qty 100

## 2018-03-18 MED ORDER — PROMETHAZINE HCL 25 MG/ML IJ SOLN: 6.2500 mg | INTRAMUSCULAR | Status: DC | PRN

## 2018-03-18 MED ORDER — LACTATED RINGERS IV SOLN
INTRAVENOUS | Status: DC
  Administered 2018-03-18 (×2): via INTRAVENOUS

## 2018-03-18 MED ORDER — STERILE WATER FOR IRRIGATION IR SOLN
Status: DC | PRN
  Administered 2018-03-18: 1000 mL

## 2018-03-18 MED ORDER — BACITRACIN-NEOMYCIN-POLYMYXIN 400-5-5000 EX OINT: 1.0000 "application " | TOPICAL_OINTMENT | Freq: Three times a day (TID) | CUTANEOUS | Status: DC | PRN

## 2018-03-18 MED ORDER — SUGAMMADEX SODIUM 500 MG/5ML IV SOLN
INTRAVENOUS | Status: AC
  Filled 2018-03-18: qty 5

## 2018-03-18 MED ORDER — INDIGOTINDISULFONATE SODIUM 8 MG/ML IJ SOLN
INTRAMUSCULAR | Status: DC | PRN
  Administered 2018-03-18: 5 mL via INTRAVENOUS

## 2018-03-18 MED ORDER — DOCUSATE SODIUM 100 MG PO CAPS
100.0000 mg | ORAL_CAPSULE | Freq: Two times a day (BID) | ORAL | Status: DC
  Administered 2018-03-18 – 2018-03-19 (×2): 100 mg via ORAL
  Filled 2018-03-18 (×2): qty 1

## 2018-03-18 MED ORDER — BUPIVACAINE-EPINEPHRINE (PF) 0.5% -1:200000 IJ SOLN
INTRAMUSCULAR | Status: AC
  Filled 2018-03-18: qty 30

## 2018-03-18 MED ORDER — MAGNESIUM CITRATE PO SOLN: 1.0000 | Freq: Once | ORAL | Status: DC

## 2018-03-18 MED ORDER — HYDRALAZINE HCL 20 MG/ML IJ SOLN
INTRAMUSCULAR | Status: AC
  Filled 2018-03-18: qty 1

## 2018-03-18 MED ORDER — DEXAMETHASONE SODIUM PHOSPHATE 10 MG/ML IJ SOLN
INTRAMUSCULAR | Status: AC
  Filled 2018-03-18: qty 1

## 2018-03-18 MED ORDER — ROCURONIUM BROMIDE 10 MG/ML (PF) SYRINGE
PREFILLED_SYRINGE | INTRAVENOUS | Status: AC
  Filled 2018-03-18: qty 10

## 2018-03-18 MED ORDER — HYDROMORPHONE HCL 1 MG/ML IJ SOLN
INTRAMUSCULAR | Status: AC
  Administered 2018-03-18: 0.25 mg via INTRAVENOUS
  Filled 2018-03-18: qty 1

## 2018-03-18 MED ORDER — ESMOLOL HCL 100 MG/10ML IV SOLN
INTRAVENOUS | Status: DC | PRN
  Administered 2018-03-18: 40 mg via INTRAVENOUS

## 2018-03-18 MED ORDER — HYDROMORPHONE HCL 1 MG/ML IJ SOLN
0.2500 mg | INTRAMUSCULAR | Status: DC | PRN
  Administered 2018-03-18 (×4): 0.25 mg via INTRAVENOUS

## 2018-03-18 MED ORDER — ONDANSETRON HCL 4 MG/2ML IJ SOLN
INTRAMUSCULAR | Status: DC | PRN
  Administered 2018-03-18 (×2): 4 mg via INTRAVENOUS

## 2018-03-18 MED ORDER — MIDAZOLAM HCL 2 MG/2ML IJ SOLN
INTRAMUSCULAR | Status: AC
  Filled 2018-03-18: qty 2

## 2018-03-18 MED ORDER — BUPIVACAINE-EPINEPHRINE 0.25% -1:200000 IJ SOLN
INTRAMUSCULAR | Status: DC | PRN
  Administered 2018-03-18: 30 mL

## 2018-03-18 MED ORDER — ACETAMINOPHEN 325 MG PO TABS: 650.0000 mg | ORAL_TABLET | ORAL | Status: DC | PRN

## 2018-03-18 MED ORDER — LIDOCAINE 2% (20 MG/ML) 5 ML SYRINGE
INTRAMUSCULAR | Status: DC | PRN
  Administered 2018-03-18: 100 mg via INTRAVENOUS

## 2018-03-18 MED ORDER — LIDOCAINE 2% (20 MG/ML) 5 ML SYRINGE
INTRAMUSCULAR | Status: AC
  Filled 2018-03-18: qty 5

## 2018-03-18 MED ORDER — KETOROLAC TROMETHAMINE 30 MG/ML IJ SOLN
INTRAMUSCULAR | Status: AC
  Filled 2018-03-18: qty 1

## 2018-03-18 MED ORDER — LACTATED RINGERS IV SOLN
INTRAVENOUS | Status: DC | PRN
  Administered 2018-03-18: 09:00:00

## 2018-03-18 MED ORDER — MIDAZOLAM HCL 2 MG/2ML IJ SOLN
INTRAMUSCULAR | Status: DC | PRN
  Administered 2018-03-18: 2 mg via INTRAVENOUS

## 2018-03-18 MED ORDER — PROPOFOL 10 MG/ML IV BOLUS
INTRAVENOUS | Status: DC | PRN
  Administered 2018-03-18: 200 mg via INTRAVENOUS

## 2018-03-18 MED ORDER — HEPARIN SODIUM (PORCINE) 1000 UNIT/ML IJ SOLN
INTRAMUSCULAR | Status: AC
  Filled 2018-03-18: qty 1

## 2018-03-18 MED ORDER — ROSUVASTATIN CALCIUM 10 MG PO TABS
10.0000 mg | ORAL_TABLET | Freq: Every day | ORAL | Status: DC
  Administered 2018-03-19: 10 mg via ORAL
  Filled 2018-03-18: qty 1

## 2018-03-18 MED ORDER — INDIGOTINDISULFONATE SODIUM 8 MG/ML IJ SOLN
INTRAMUSCULAR | Status: AC
  Filled 2018-03-18: qty 5

## 2018-03-18 MED ORDER — DEXAMETHASONE SODIUM PHOSPHATE 10 MG/ML IJ SOLN
INTRAMUSCULAR | Status: DC | PRN
  Administered 2018-03-18: 10 mg via INTRAVENOUS

## 2018-03-18 MED ORDER — FENTANYL CITRATE (PF) 100 MCG/2ML IJ SOLN
INTRAMUSCULAR | Status: DC | PRN
  Administered 2018-03-18: 100 ug via INTRAVENOUS
  Administered 2018-03-18: 50 ug via INTRAVENOUS
  Administered 2018-03-18 (×2): 25 ug via INTRAVENOUS
  Administered 2018-03-18: 100 ug via INTRAVENOUS
  Administered 2018-03-18: 50 ug via INTRAVENOUS
  Administered 2018-03-18: 100 ug via INTRAVENOUS
  Administered 2018-03-18: 50 ug via INTRAVENOUS

## 2018-03-18 SURGICAL SUPPLY — 61 items
ADH SKN CLS APL DERMABOND .7 (GAUZE/BANDAGES/DRESSINGS) ×2
APL SWBSTK 6 STRL LF DISP (MISCELLANEOUS) ×2
APPLICATOR COTTON TIP 6 STRL (MISCELLANEOUS) ×2 IMPLANT
APPLICATOR COTTON TIP 6IN STRL (MISCELLANEOUS) ×4
CATH FOLEY 2WAY SLVR 18FR 30CC (CATHETERS) ×4 IMPLANT
CATH ROBINSON RED A/P 16FR (CATHETERS) ×4 IMPLANT
CATH ROBINSON RED A/P 8FR (CATHETERS) ×4 IMPLANT
CATH TIEMANN FOLEY 18FR 5CC (CATHETERS) ×4 IMPLANT
CHLORAPREP W/TINT 26ML (MISCELLANEOUS) ×4 IMPLANT
CLIP VESOLOCK LG 6/CT PURPLE (CLIP) ×8 IMPLANT
COVER SURGICAL LIGHT HANDLE (MISCELLANEOUS) ×4 IMPLANT
COVER TIP SHEARS 8 DVNC (MISCELLANEOUS) ×2 IMPLANT
COVER TIP SHEARS 8MM DA VINCI (MISCELLANEOUS) ×2
CUTTER ECHEON FLEX ENDO 45 340 (ENDOMECHANICALS) ×4 IMPLANT
DECANTER SPIKE VIAL GLASS SM (MISCELLANEOUS) ×4 IMPLANT
DERMABOND ADVANCED (GAUZE/BANDAGES/DRESSINGS) ×2
DERMABOND ADVANCED .7 DNX12 (GAUZE/BANDAGES/DRESSINGS) IMPLANT
DRAPE ARM DVNC X/XI (DISPOSABLE) ×8 IMPLANT
DRAPE COLUMN DVNC XI (DISPOSABLE) ×2 IMPLANT
DRAPE DA VINCI XI ARM (DISPOSABLE) ×8
DRAPE DA VINCI XI COLUMN (DISPOSABLE) ×2
DRAPE SURG IRRIG POUCH 19X23 (DRAPES) ×4 IMPLANT
DRSG TEGADERM 4X4.75 (GAUZE/BANDAGES/DRESSINGS) ×4 IMPLANT
ELECT REM PT RETURN 15FT ADLT (MISCELLANEOUS) ×4 IMPLANT
GAUZE SPONGE 2X2 8PLY STRL LF (GAUZE/BANDAGES/DRESSINGS) IMPLANT
GLOVE BIO SURGEON STRL SZ 6.5 (GLOVE) ×3 IMPLANT
GLOVE BIO SURGEONS STRL SZ 6.5 (GLOVE) ×1
GLOVE BIOGEL M STRL SZ7.5 (GLOVE) ×8 IMPLANT
GOWN STRL REUS W/TWL LRG LVL3 (GOWN DISPOSABLE) ×12 IMPLANT
HEMOSTAT SURGICEL 2X3 (HEMOSTASIS) ×2 IMPLANT
HOLDER FOLEY CATH W/STRAP (MISCELLANEOUS) ×4 IMPLANT
IRRIG SUCT STRYKERFLOW 2 WTIP (MISCELLANEOUS) ×4
IRRIGATION SUCT STRKRFLW 2 WTP (MISCELLANEOUS) ×2 IMPLANT
IV LACTATED RINGERS 1000ML (IV SOLUTION) ×4 IMPLANT
NDL SAFETY ECLIPSE 18X1.5 (NEEDLE) ×2 IMPLANT
NEEDLE HYPO 18GX1.5 SHARP (NEEDLE) ×4
PACK ROBOT UROLOGY CUSTOM (CUSTOM PROCEDURE TRAY) ×4 IMPLANT
RELOAD STAPLE 45 4.1 GRN THCK (STAPLE) ×2 IMPLANT
SEAL CANN UNIV 5-8 DVNC XI (MISCELLANEOUS) ×8 IMPLANT
SEAL XI 5MM-8MM UNIVERSAL (MISCELLANEOUS) ×8
SOLUTION ELECTROLUBE (MISCELLANEOUS) ×4 IMPLANT
SPONGE GAUZE 2X2 STER 10/PKG (GAUZE/BANDAGES/DRESSINGS) ×2
STAPLE RELOAD 45 GRN (STAPLE) ×2 IMPLANT
STAPLE RELOAD 45MM GREEN (STAPLE) ×4
SUT ETHILON 3 0 PS 1 (SUTURE) ×4 IMPLANT
SUT MNCRL 3 0 RB1 (SUTURE) ×2 IMPLANT
SUT MNCRL 3 0 VIOLET RB1 (SUTURE) ×2 IMPLANT
SUT MNCRL AB 4-0 PS2 18 (SUTURE) ×8 IMPLANT
SUT MONOCRYL 3 0 RB1 (SUTURE) ×6
SUT NOVA NAB DX-16 0-1 5-0 T12 (SUTURE) ×2 IMPLANT
SUT VIC AB 0 CT1 27 (SUTURE) ×4
SUT VIC AB 0 CT1 27XBRD ANTBC (SUTURE) ×2 IMPLANT
SUT VIC AB 0 UR5 27 (SUTURE) ×4 IMPLANT
SUT VIC AB 2-0 SH 27 (SUTURE) ×4
SUT VIC AB 2-0 SH 27X BRD (SUTURE) ×2 IMPLANT
SUT VICRYL 0 UR6 27IN ABS (SUTURE) ×8 IMPLANT
SYR 27GX1/2 1ML LL SAFETY (SYRINGE) ×4 IMPLANT
TOWEL OR 17X26 10 PK STRL BLUE (TOWEL DISPOSABLE) ×4 IMPLANT
TOWEL OR NON WOVEN STRL DISP B (DISPOSABLE) ×4 IMPLANT
TUBING INSUFFLATION 10FT LAP (TUBING) IMPLANT
WATER STERILE IRR 1000ML POUR (IV SOLUTION) ×8 IMPLANT

## 2018-03-18 NOTE — Anesthesia Preprocedure Evaluation (Signed)
Anesthesia Evaluation  Patient identified by MRN, date of birth, ID band Patient awake    Reviewed: Allergy & Precautions, NPO status , Patient's Chart, lab work & pertinent test results  Airway Mallampati: II  TM Distance: >3 FB Neck ROM: Full    Dental no notable dental hx.    Pulmonary sleep apnea , former smoker,    Pulmonary exam normal breath sounds clear to auscultation       Cardiovascular hypertension, Normal cardiovascular exam Rhythm:Regular Rate:Normal     Neuro/Psych negative neurological ROS  negative psych ROS   GI/Hepatic negative GI ROS, Neg liver ROS,   Endo/Other  negative endocrine ROS  Renal/GU negative Renal ROS  negative genitourinary   Musculoskeletal negative musculoskeletal ROS (+)   Abdominal   Peds negative pediatric ROS (+)  Hematology negative hematology ROS (+)   Anesthesia Other Findings   Reproductive/Obstetrics negative OB ROS                             Anesthesia Physical Anesthesia Plan  ASA: III  Anesthesia Plan: General   Post-op Pain Management:    Induction: Intravenous  PONV Risk Score and Plan: 2 and Ondansetron and Dexamethasone  Airway Management Planned: Oral ETT  Additional Equipment:   Intra-op Plan:   Post-operative Plan: Extubation in OR  Informed Consent: I have reviewed the patients History and Physical, chart, labs and discussed the procedure including the risks, benefits and alternatives for the proposed anesthesia with the patient or authorized representative who has indicated his/her understanding and acceptance.   Dental advisory given  Plan Discussed with: CRNA and Surgeon  Anesthesia Plan Comments:         Anesthesia Quick Evaluation

## 2018-03-18 NOTE — Op Note (Signed)
Preoperative diagnosis: Clinically localized adenocarcinoma of the prostate (clinical stage T2 Nx Mx), umbilical hernia  Postoperative diagnosis: Clinically localized adenocarcinoma of the prostate (clinical stage T2 Nx Mx), umbilical hernia  Procedure:  1. Robotic assisted laparoscopic radical prostatectomy (bilateral nerve sparing) 2. Bilateral robotic assisted laparoscopic pelvic lymphadenectomy 3. Umbilical hernia repair  Surgeon: Pryor Curia. M.D.  Assistant: Debbrah Alar, PA-C  An assistant was required for this surgical procedure.  The duties of the assistant included but were not limited to suctioning, passing suture, camera manipulation, retraction. This procedure would not be able to be performed without an Environmental consultant.  Resident: Dr. Quincy Simmonds  Anesthesia: General  Complications: None  EBL: 150 mL  IVF:  1200 mL crystalloid  Specimens: 1. Prostate and seminal vesicles 2. Right pelvic lymph nodes 3. Left pelvic lymph nodes  Disposition of specimens: Pathology  Drains: 1. 20 Fr coude catheter 2. # 19 Blake pelvic drain  Indication: Jesse English is a 57 y.o. year old patient with clinically localized prostate cancer and an umbilical hernia.  After a thorough review of the management options for treatment of prostate cancer, he elected to proceed with surgical therapy and the above procedure(s).  We have discussed the potential benefits and risks of the procedure, side effects of the proposed treatment, the likelihood of the patient achieving the goals of the procedure, and any potential problems that might occur during the procedure or recuperation. Informed consent has been obtained.  Description of procedure:  The patient was taken to the operating room and a general anesthetic was administered. He was given preoperative antibiotics, placed in the dorsal lithotomy position, and prepped and draped in the usual sterile fashion. Next a preoperative  timeout was performed. A urethral catheter was placed into the bladder and a site was selected near the umbilicus for placement of the camera port. This was placed using a standard open Hassan technique which allowed entry into the peritoneal cavity under direct vision and without difficulty. An 8 mm robotic port was placed and a pneumoperitoneum established. The camera was then used to inspect the abdomen and there was no evidence of any intra-abdominal injuries or other abnormalities. The remaining abdominal ports were then placed. 8 mm robotic ports were placed in the right lower quadrant, left lower quadrant, and far left lateral abdominal wall. A 5 mm port was placed in the right upper quadrant and a 12 mm port was placed in the right lateral abdominal wall for laparoscopic assistance. All ports were placed under direct vision without difficulty. The surgical cart was then docked.   Utilizing the cautery scissors, the bladder was reflected posteriorly allowing entry into the space of Retzius and identification of the endopelvic fascia and prostate. The periprostatic fat was then removed from the prostate allowing full exposure of the endopelvic fascia. The endopelvic fascia was then incised from the apex back to the base of the prostate bilaterally and the underlying levator muscle fibers were swept laterally off the prostate thereby isolating the dorsal venous complex. The dorsal vein was then stapled and divided with a 45 mm Flex Echelon stapler. Attention then turned to the bladder neck which was divided anteriorly thereby allowing entry into the bladder and exposure of the urethral catheter. The catheter balloon was deflated and the catheter was brought into the operative field and used to retract the prostate anteriorly. The posterior bladder neck was then examined.  There was a prominent bilobed median intravesical lobe and this was carefully  dissected away from the bladder neck mucosa and enucleated  off the bladder.  The ureteral orifices were identified with the aid of indigo carmine and were noted to be away from the edge of the bladder neck.  The posterior bladder neck was divided allowing further dissection between the bladder and prostate posteriorly until the vasa deferentia and seminal vessels were identified. The vasa deferentia were isolated, divided, and lifted anteriorly. The seminal vesicles were dissected down to their tips with care to control the seminal vascular arterial blood supply. These structures were then lifted anteriorly and the space between Denonvillier's fascia and the anterior rectum was developed with a combination of sharp and blunt dissection. This isolated the vascular pedicles of the prostate.  The lateral prostatic fascia was then sharply incised allowing release of the neurovascular bundles bilaterally. The vascular pedicles of the prostate were then ligated with Weck clips between the prostate and neurovascular bundles and divided with sharp cold scissor dissection resulting in neurovascular bundle preservation. The neurovascular bundles were then separated off the apex of the prostate and urethra bilaterally.  The urethra was then sharply transected allowing the prostate specimen to be disarticulated. The pelvis was copiously irrigated and hemostasis was ensured. There was no evidence for rectal injury.  Attention then turned to the right pelvic sidewall. The fibrofatty tissue between the external iliac vein, confluence of the iliac vessels, hypogastric artery, and Cooper's ligament was dissected free from the pelvic sidewall with care to preserve the obturator nerve. Weck clips were used for lymphostasis and hemostasis. An identical procedure was performed on the contralateral side and the lymphatic packets were removed for permanent pathologic analysis.  Attention then turned to the urethral anastomosis. A 2-0 Vicryl slip knot was placed between Denonvillier's  fascia, the posterior bladder neck, and the posterior urethra to reapproximate these structures. A double-armed 3-0 Monocryl suture was then used to perform a 360 running tension-free anastomosis between the bladder neck and urethra. A new urethral catheter was then placed into the bladder and irrigated. There were no blood clots within the bladder and the anastomosis appeared to be watertight. A #19 Blake drain was then brought through the left lateral 8 mm port site and positioned appropriately within the pelvis. It was secured to the skin with a nylon suture. The surgical cart was then undocked. The right lateral 12 mm port site was closed at the fascial level with a 0 Vicryl suture placed laparoscopically. All remaining ports were then removed under direct vision. The prostate specimen was removed intact within the Endopouch retrieval bag via the periumbilical camera port site. The umbilicus was then examined and there was a 1 cm hernia defect.  The camera port incision was extended slightly along the lateral aspect of the umbilicus and the hernia sac was dissected out and removed.  The fascial opening was closed with interrupted 2-0 Novofil sutures to repair the hernia defect and close the camera port incision together. 0.5% Marcaine was then injected into all port sites and all incisions were reapproximated at the skin level with 4-0 Monocryl subcuticular sutures and Dermabond. The patient appeared to tolerate the procedure well and without complications. The patient was able to be extubated and transferred to the recovery unit in satisfactory condition.   Pryor Curia MD

## 2018-03-18 NOTE — Anesthesia Procedure Notes (Signed)
Procedure Name: Intubation Date/Time: 03/18/2018 7:42 AM Performed by: Genelle Bal, CRNA Pre-anesthesia Checklist: Patient identified, Emergency Drugs available, Suction available and Patient being monitored Patient Re-evaluated:Patient Re-evaluated prior to induction Oxygen Delivery Method: Circle system utilized Preoxygenation: Pre-oxygenation with 100% oxygen Induction Type: IV induction Ventilation: Mask ventilation without difficulty and Oral airway inserted - appropriate to patient size Laryngoscope Size: Mac and 4 Grade View: Grade III Tube type: Oral Tube size: 7.5 mm Number of attempts: 1 Airway Equipment and Method: Stylet,  Oral airway and Bougie stylet Placement Confirmation: ETT inserted through vocal cords under direct vision,  positive ETCO2 and breath sounds checked- equal and bilateral Secured at: 23 cm Tube secured with: Tape Dental Injury: Injury to lip  Difficulty Due To: Difficulty was unanticipated, Difficult Airway- due to immobile epiglottis, Difficult Airway- due to anterior larynx and Difficult Airway- due to large tongue Future Recommendations: Recommend- induction with short-acting agent, and alternative techniques readily available Comments: DL x1 Mil 2, grade IV view, DL x1 Mac 4, grade III view, difficult to pass ETT through glottis, bougie used successfully, OI with 7.5 ETT, +BBS/etCO2 confirmation.

## 2018-03-18 NOTE — Discharge Instructions (Signed)

## 2018-03-18 NOTE — Progress Notes (Addendum)
Post-op note  Subjective: The patient is doing well.  No complaints.  Denies N/V  Objective: Vital signs in last 24 hours: Temp:  [97.5 F (36.4 C)-98.3 F (36.8 C)] 98.2 F (36.8 C) (09/09 1459) Pulse Rate:  [18-87] 79 (09/09 1459) Resp:  [14-19] 18 (09/09 1459) BP: (148-172)/(80-93) 157/90 (09/09 1459) SpO2:  [96 %-100 %] 100 % (09/09 1459) Weight:  [113.1 kg] 113.1 kg (09/09 0500)  Intake/Output from previous day: No intake/output data recorded. Intake/Output this shift: Total I/O In: 2380.7 [P.O.:120; I.V.:1460.7; IV Piggyback:800] Out: 280 [Urine:50; Drains:80; Blood:150]  Physical Exam:  General: Alert and oriented. Abdomen: Soft, Nondistended. Incisions: Clean and dry. Urine: red  Lab Results: Recent Labs    03/18/18 1134  HGB 15.6  HCT 45.9    Assessment/Plan: POD#0   1) Continue to monitor  2) DVT prophy, clears, IS, amb, pain control  3) Lovenox for hx of PE   LOS: 0 days   Debbrah Alar 03/18/2018, 3:48 PM

## 2018-03-18 NOTE — H&P (Signed)
CC/HPI: CC: Prostate Cancer    Jesse English is a 57 year old gentleman who was found to have an elevated PSA of 7.4 and a concerning nodule at the right base of the prostate. He underwent a TRUS biopsy of the prostate on 02/04/18 indicating Gleason 3+4=7 adenocarcinoma of the prostate with 3 out of 12 biopsy cores positive for malignancy.   Family history: None.   Imaging studies: None.   PMH: He has a history of Crohn's disease with severe anal fistula disease in the past (he did develop Fournier's gangrene requiring surgical debridement in the past but his Crohn's disease has been well controlled with Enytvio), gout, hypertension, and sleep apnea. He also has a history of a saddle pulmonary embolism during a prior hospitalization.  PSH: No abdominal surgery. He also has had a prior bout of C diff colitis.   TNM stage: cT2 Nx Mx (R base)  PSA: 7.4  Gleason score: 3+4=7  Biopsy (02/04/18): 3/12 cores positive  Left: Benign  Right: R mid (20%, 3+4=7), R lateral mid (30%, 3+3=6), R base (50%, 3+4=7)  Prostate volume: 45.6 cc   Nomogram  OC disease: 40%  EPE: 58%  SVI: 4%  LNI: 4%  PFS (5 year, 10 year): 77%, 63%   Urinary function: IPSS is 4.  Erectile function: SHIM score is 24.     ALLERGIES: ACE Inhibitors    MEDICATIONS: Hydrochlorothiazide  Entyvio  Rosuvastatin Calcium  Vitamin B 12     Notes: Pt taking a gout medication BID, but doesn't recall the name.   GU PSH: Prostate Needle Biopsy - 02/04/2018    NON-GU PSH: Surgical Pathology, Gross And Microscopic Examination For Prostate Needle - 02/04/2018    GU PMH: Elevated PSA - 01/30/2018 Prostate nodule w/o LUTS - 01/30/2018 Urinary Retention - 05/03/2016      PMH Notes:   1) BPH/LUTS: He has a history of postoperative urinary retention in 2017. He was on alfuzosin for a short period of time but was able to eventually discontinue this medication.   NON-GU PMH: Crohn''s disease of large intestine with  fistula Gout Hypertension Sleep Apnea    FAMILY HISTORY: 1 Daughter - Other 1 son - Other   SOCIAL HISTORY: Marital Status: Married Preferred Language: English; Ethnicity: Not Hispanic Or Latino; Race: White Current Smoking Status: Patient does not smoke anymore.  Has never drank.  Does not drink caffeine.    REVIEW OF SYSTEMS:    GU Review Male:   Patient denies frequent urination, hard to postpone urination, burning/ pain with urination, get up at night to urinate, leakage of urine, stream starts and stops, trouble starting your streams, and have to strain to urinate .  Gastrointestinal (Lower):   Patient denies diarrhea and constipation.  Gastrointestinal (Upper):   Patient denies vomiting and nausea.  Constitutional:   Patient denies fever, night sweats, weight loss, and fatigue.  Skin:   Patient denies skin rash/ lesion and itching.  Eyes:   Patient denies blurred vision and double vision.  Ears/ Nose/ Throat:   Patient denies sore throat and sinus problems.  Hematologic/Lymphatic:   Patient denies swollen glands and easy bruising.  Cardiovascular:   Patient denies leg swelling and chest pains.  Respiratory:   Patient denies cough and shortness of breath.  Endocrine:   Patient denies excessive thirst.  Musculoskeletal:   Patient denies back pain and joint pain.  Neurological:   Patient denies headaches and dizziness.  Psychologic:   Patient denies  depression and anxiety.   VITAL SIGNS:     Weight 243 lb / 110.22 kg  Height 71 in / 180.34 cm  BMI 33.9 kg/m   MULTI-SYSTEM PHYSICAL EXAMINATION:    Constitutional: Well-nourished. No physical deformities. Normally developed. Good grooming.  Neck: Neck symmetrical, not swollen. Normal tracheal position.  Respiratory: No labored breathing, no use of accessory muscles. Clear bilaterally.  Cardiovascular: Normal temperature, normal extremity pulses, no swelling, no varicosities. Regular rate and rhythm.  Lymphatic: No  enlargement of neck, axillae, groin.  Skin: No paleness, no jaundice, no cyanosis. No lesion, no ulcer, no rash.  Neurologic / Psychiatric: Oriented to time, oriented to place, oriented to person. No depression, no anxiety, no agitation.  Gastrointestinal: No mass, no tenderness, no rigidity.  Eyes: Normal conjunctivae. Normal eyelids.  Ears, Nose, Mouth, and Throat: Left ear no scars, no lesions, no masses. Right ear no scars, no lesions, no masses. Nose no scars, no lesions, no masses. Normal hearing. Normal lips.  Musculoskeletal: Normal gait and station of head and neck.     ASSESSMENT:      ICD-10 Details  1 GU:   Prostate Cancer - C61    PLAN:              1. Favorable intermediate risk prostate cancer:  Ultimately, he does wish to proceed with surgical treatment. At considering his history of Crohn's disease and colitis, he does wish to minimize any risk of bowel toxicity. He has elected to proceed with surgical therapy will be scheduled for a bilateral nerve-sparing robot assisted laparoscopic radical prostatectomy and pelvic lymphadenectomy.

## 2018-03-18 NOTE — Anesthesia Postprocedure Evaluation (Signed)
Anesthesia Post Note  Patient: Jesse English  Procedure(s) Performed: XI ROBOTIC ASSISTED LAPAROSCOPIC RADICAL PROSTATECTOMY LEVEL 2; UMBILICAL HERNIA REPAIR (N/A ) LYMPHADENECTOMY, PELVIC (Bilateral )     Patient location during evaluation: PACU Anesthesia Type: General Level of consciousness: awake and alert Pain management: pain level controlled Vital Signs Assessment: post-procedure vital signs reviewed and stable Respiratory status: spontaneous breathing, nonlabored ventilation, respiratory function stable and patient connected to nasal cannula oxygen Cardiovascular status: blood pressure returned to baseline and stable Postop Assessment: no apparent nausea or vomiting Anesthetic complications: no    Last Vitals:  Vitals:   03/18/18 1215 03/18/18 1237  BP: (!) 148/80 (!) 164/86  Pulse: 87 82  Resp: 19 18  Temp: 36.8 C (!) 36.4 C  SpO2: 96% 98%    Last Pain:  Vitals:   03/18/18 1237  TempSrc: Oral  PainSc:                  Danaya Geddis S

## 2018-03-18 NOTE — Transfer of Care (Signed)
Immediate Anesthesia Transfer of Care Note  Patient: Jesse English  Procedure(s) Performed: XI ROBOTIC ASSISTED LAPAROSCOPIC RADICAL PROSTATECTOMY LEVEL 2; UMBILICAL HERNIA REPAIR (N/A ) LYMPHADENECTOMY, PELVIC (Bilateral )  Patient Location: PACU  Anesthesia Type:General  Level of Consciousness: awake, alert  and oriented  Airway & Oxygen Therapy: Patient Spontanous Breathing and Patient connected to face mask oxygen  Post-op Assessment: Report given to RN and Post -op Vital signs reviewed and stable  Post vital signs: Reviewed and stable  Last Vitals:  Vitals Value Taken Time  BP 172/91 03/18/2018 11:18 AM  Temp    Pulse 88 03/18/2018 11:19 AM  Resp 18 03/18/2018 11:19 AM  SpO2 100 % 03/18/2018 11:19 AM  Vitals shown include unvalidated device data.  Last Pain:  Vitals:   03/18/18 0622  TempSrc:   PainSc: 0-No pain         Complications: No apparent anesthesia complications

## 2018-03-19 ENCOUNTER — Encounter (HOSPITAL_COMMUNITY): Payer: Self-pay | Admitting: Urology

## 2018-03-19 DIAGNOSIS — C61 Malignant neoplasm of prostate: Secondary | ICD-10-CM | POA: Diagnosis not present

## 2018-03-19 LAB — HEMOGLOBIN AND HEMATOCRIT, BLOOD
HEMATOCRIT: 38.3 % — AB (ref 39.0–52.0)
HEMATOCRIT: 38.1 % — AB (ref 39.0–52.0)
Hemoglobin: 13 g/dL (ref 13.0–17.0)
Hemoglobin: 13.5 g/dL (ref 13.0–17.0)

## 2018-03-19 MED ORDER — TRAMADOL HCL 50 MG PO TABS: 50.0000 mg | ORAL_TABLET | Freq: Four times a day (QID) | ORAL | Status: DC | PRN

## 2018-03-19 MED ORDER — BISACODYL 10 MG RE SUPP
10.0000 mg | Freq: Once | RECTAL | Status: AC
  Administered 2018-03-19: 10 mg via RECTAL
  Filled 2018-03-19: qty 1

## 2018-03-19 NOTE — Progress Notes (Signed)
Pt discharged from the unit via wheelchair. Discharge instructions were reviewed with pt and family members. Scripts given for prescriptions at discharge. Education on incision and catheter care given to pt. No questions or concerns at this time.

## 2018-03-19 NOTE — Progress Notes (Signed)
Patient ID: EARLY STEEL, male   DOB: 08-29-1960, 57 y.o.   MRN: [id number]  1 Day Post-Op Subjective: The patient is doing well.  No nausea or vomiting. Pain is adequately controlled.  Objective: Vital signs in last 24 hours: Temp:  [97.5 F (36.4 C)-98.6 F (37 C)] 98.6 F (37 C) (09/10 0155) Pulse Rate:  [64-87] 64 (09/10 0155) Resp:  [14-19] 16 (09/10 0155) BP: (135-172)/(79-91) 135/79 (09/10 0155) SpO2:  [96 %-100 %] 100 % (09/10 0155)  Intake/Output from previous day: 09/09 0701 - 09/10 0700 In: 4095.7 [P.O.:240; I.V.:2660.7; IV Piggyback:1195] Out: 1630 [Urine:1350; Drains:130; Blood:150] Intake/Output this shift: No intake/output data recorded.  Physical Exam:  General: Alert and oriented. CV: RRR Lungs: Clear bilaterally. GI: Soft, Nondistended. Incisions: Clean, dry, and intact Urine: Clear Extremities: Nontender, no erythema, no edema.  Lab Results: Recent Labs    03/18/18 1134 03/19/18 0415  HGB 15.6 13.0  HCT 45.9 38.3*      Assessment/Plan: POD# 1 s/p robotic prostatectomy.  1) SL IVF 2) Ambulate, Incentive spirometry 3) Transition to oral pain medication 4) Dulcolax suppository 5) D/C pelvic drain 6) Re check H/H 7) Plan for likely discharge later today   Pryor Curia. MD   LOS: 0 days   Macklyn Glandon,LES 03/19/2018, 7:25 AM

## 2018-03-19 NOTE — Discharge Summary (Signed)
  Date of admission: 03/18/2018  Date of discharge: 03/19/2018  Admission diagnosis: Prostate Cancer  Discharge diagnosis: Prostate Cancer  History and Physical: For full details, please see admission history and physical. Briefly, Jesse English is a 57 y.o. gentleman with localized prostate cancer.  After discussing management/treatment options, he elected to proceed with surgical treatment.  Hospital Course: Jesse English was taken to the operating room on 03/18/2018 and underwent a robotic assisted laparoscopic radical prostatectomy. He tolerated this procedure well and without complications. Postoperatively, he was able to be transferred to a regular hospital room following recovery from anesthesia.  He was able to begin ambulating the night of surgery. He remained hemodynamically stable overnight.  He had excellent urine output with appropriately minimal output from his pelvic drain and his pelvic drain was removed on POD #1.  He was transitioned to oral pain medication, tolerated a clear liquid diet, and had met all discharge criteria and was able to be discharged home later on POD#1.  Laboratory values:  Recent Labs    03/18/18 1134 03/19/18 0415 03/19/18 1109  HGB 15.6 13.0 13.5  HCT 45.9 38.3* 38.1*    Disposition: Home  Discharge instruction: He was instructed to be ambulatory but to refrain from heavy lifting, strenuous activity, or driving. He was instructed on urethral catheter care.  Discharge medications:   Allergies as of 03/19/2018      Reactions   Ace Inhibitors Swelling, Other (See Comments)   Facial swelling and small intestinal swelling.      Medication List    TAKE these medications   hydrochlorothiazide 25 MG tablet Commonly known as:  HYDRODIURIL Take 25 mg by mouth daily.   rosuvastatin 10 MG tablet Commonly known as:  CRESTOR Take 10 mg by mouth daily.   sulfamethoxazole-trimethoprim 800-160 MG tablet Commonly known as:  BACTRIM DS,SEPTRA  DS Take 1 tablet by mouth 2 (two) times daily. Start the day prior to foley removal appointment   traMADol 50 MG tablet Commonly known as:  ULTRAM Take 1-2 tablets (50-100 mg total) by mouth every 6 (six) hours as needed for moderate pain or severe pain.       Followup: He will followup in 1 week for catheter removal and to discuss his surgical pathology results.

## 2018-03-21 DIAGNOSIS — C61 Malignant neoplasm of prostate: Secondary | ICD-10-CM | POA: Diagnosis not present

## 2018-03-21 DIAGNOSIS — M25571 Pain in right ankle and joints of right foot: Secondary | ICD-10-CM | POA: Diagnosis not present

## 2018-03-21 DIAGNOSIS — L03115 Cellulitis of right lower limb: Secondary | ICD-10-CM | POA: Diagnosis not present

## 2018-04-03 ENCOUNTER — Telehealth: Payer: Self-pay | Admitting: Gastroenterology

## 2018-04-03 NOTE — Telephone Encounter (Signed)
Patient states he has been going to [facility] for his entyvio infusions and he wants to switch places. Patient states every time he goes they can not find a vein and stick him multiple times while trying. Patient states it is very unprofessional and wants to do it somewhere else or change to a shot instead of an infusion.

## 2018-04-04 ENCOUNTER — Other Ambulatory Visit: Payer: Self-pay

## 2018-04-04 DIAGNOSIS — K50113 Crohn's disease of large intestine with fistula: Secondary | ICD-10-CM

## 2018-04-04 MED ORDER — ENTYVIO 300 MG IV SOLR: 300.0000 mg | INTRAVENOUS | 6 refills | Status: DC

## 2018-04-04 NOTE — Telephone Encounter (Signed)
Patient states he is 3 weeks behind on getting his infusions and wants to now go to [facility] rheumatology to get them done unless Dr.Nandigam has another suggestion.

## 2018-04-04 NOTE — Telephone Encounter (Signed)
I have spoken with the patient and faxed the referral with records to [facility]. Scheduled Jesse English for his follow up appointment here in November which is the first available.

## 2018-04-15 DIAGNOSIS — M6281 Muscle weakness (generalized): Secondary | ICD-10-CM | POA: Diagnosis not present

## 2018-04-15 DIAGNOSIS — M62838 Other muscle spasm: Secondary | ICD-10-CM | POA: Diagnosis not present

## 2018-04-15 DIAGNOSIS — N393 Stress incontinence (female) (male): Secondary | ICD-10-CM | POA: Diagnosis not present

## 2018-04-19 ENCOUNTER — Telehealth: Payer: Self-pay | Admitting: Gastroenterology

## 2018-04-19 NOTE — Telephone Encounter (Signed)
Insurance rep couldn't tell the patient where he could get infusions.  I called [facility]. Colletta Maryland will see if she can schedule the patient back with her doctors. Needs the date of his last infusion. Fax number is [phone]. Last office note also.

## 2018-04-19 NOTE — Telephone Encounter (Signed)
10 weeks since last infusion of Entyvio. He says the problem is becoming worse. The infusion center nurses cannot access his vein to give him his medication. Transferred from [place] to [facility] in hopes this situation would improve. Same issue.  He feels he wants to change to a self administered injection. Or something. Very frustrated.

## 2018-04-19 NOTE — Telephone Encounter (Signed)
Patient is aware of this plan. Agrees to contact his insurance to get names of infusion centers that participate with his plan. He will let me know. Humira Rx to [facility]. They will do the prior authorization. Form to [employer].

## 2018-04-19 NOTE — Telephone Encounter (Signed)
Pt called to inform that he went to have his entyvio infusion this morning but they could not find a vein so he left. Pt is getting frustrated and is wondering if there is another alternative that he can have as he is afraid that he is going to have a flare.

## 2018-04-19 NOTE — Telephone Encounter (Signed)
Insurance was not helpful at all. Wants to speak to nurse [facility] gain please. Can be reached at [phone].

## 2018-04-19 NOTE — Telephone Encounter (Signed)
Please get him in for infusion of Entyvio soon so he does not have a recurrent flare.  Contact insurance for approval of Humira.  He will need to do the initiation loading dose once approved.  Ideally would like to complete the Humira initiation dose by the time he will be due for next Entyvio.

## 2018-04-22 NOTE — Telephone Encounter (Signed)
Patient's last infusion was with [facility]. Never received an infusion from [facility]. Colletta Maryland will ask the doctor if they can take the patient back.

## 2018-04-23 NOTE — Telephone Encounter (Signed)
[facility] will infuse this patient this week. They have contacted the patient.

## 2018-04-25 ENCOUNTER — Other Ambulatory Visit: Payer: Self-pay

## 2018-04-25 DIAGNOSIS — K5090 Crohn's disease, unspecified, without complications: Secondary | ICD-10-CM | POA: Diagnosis not present

## 2018-04-25 DIAGNOSIS — K50113 Crohn's disease of large intestine with fistula: Secondary | ICD-10-CM

## 2018-04-25 MED ORDER — HUMIRA (2 PEN) 40 MG/0.4ML ~~LOC~~ AJKT: 40.0000 mg | AUTO-INJECTOR | SUBCUTANEOUS | 6 refills | Status: DC

## 2018-04-25 MED ORDER — HUMIRA-CD/UC/HS STARTER 80 MG/0.8ML ~~LOC~~ AJKT: 160.0000 mg | AUTO-INJECTOR | Freq: Once | SUBCUTANEOUS | 0 refills | Status: DC

## 2018-04-25 NOTE — Telephone Encounter (Signed)
[facility] warm transfer to [place]. Rx transmitted from [facility] also. The patient received his Entyvio infusion today. He has talked with "Doren Custard" the [employer] several times. He has received the information kit from Humira. Once he receives his Humira, he is ready to begin induction.  TB screening in up to date.

## 2018-04-25 NOTE — Telephone Encounter (Signed)
Ok please schedule follow up visit soon, next available. Thanks

## 2018-04-26 NOTE — Telephone Encounter (Signed)
Its good. Thanks

## 2018-04-26 NOTE — Telephone Encounter (Signed)
Is November 8 too soon?

## 2018-04-29 ENCOUNTER — Telehealth: Payer: Self-pay | Admitting: Gastroenterology

## 2018-04-29 NOTE — Telephone Encounter (Signed)
Advised to begin the injections tomorrow.

## 2018-04-29 NOTE — Telephone Encounter (Signed)
Left a message for the patient to keep this appointment or call if this is not okay with his schedule.

## 2018-05-17 ENCOUNTER — Encounter: Payer: Self-pay | Admitting: Gastroenterology

## 2018-05-17 ENCOUNTER — Ambulatory Visit: Payer: 59 | Admitting: Gastroenterology

## 2018-05-17 VITALS — BP 110/78 | HR 62 | Ht 71.0 in | Wt 227.6 lb

## 2018-05-17 DIAGNOSIS — K5010 Crohn's disease of large intestine without complications: Secondary | ICD-10-CM

## 2018-05-17 NOTE — Progress Notes (Signed)
Jesse English    [id number]    1960/08/21  Primary Care Physician:Shaw, Gwyndolyn Saxon, MD  Referring Physician: Marton Redwood, MD [address] [place], [place] [address]  Chief complaint:  Crohn's disease   HPI:  57 yr M with history of Crohn's disease currently in clinical remission on Entyvio. Patient has been having getting infusion scheduled due to difficult IV access and also scheduling difficulty at infusion center. Last Entyvio infusion on Oct 18th.  Switched to Humira, started induction dose on Oct 21st 1 BM daily, denies any change in consistency and no blood in stool.  Doing well overall.  S/p prostatectomy radical with lymphadenoectomy and umbilical hernia repair 07/18/12 for prostate adenocarcionoma.   Outpatient Encounter Medications as of 05/17/2018  Medication Sig  . ENTYVIO 300 MG injection Inject 300 mg into the vein every 8 (eight) weeks.  Marland Kitchen HUMIRA PEN 40 MG/0.4ML PNKT Inject 40 mg into the skin every 14 (fourteen) days.  . hydrochlorothiazide (HYDRODIURIL) 25 MG tablet Take 25 mg by mouth daily.   . rosuvastatin (CRESTOR) 10 MG tablet Take 10 mg by mouth daily.  Marland Kitchen sulfamethoxazole-trimethoprim (BACTRIM DS,SEPTRA DS) 800-160 MG tablet Take 1 tablet by mouth 2 (two) times daily. Start the day prior to foley removal appointment  . traMADol (ULTRAM) 50 MG tablet Take 1-2 tablets (50-100 mg total) by mouth every 6 (six) hours as needed for moderate pain or severe pain.   No facility-administered encounter medications on file as of 05/17/2018.     Allergies as of 05/17/2018 - Review Complete 03/18/2018  Allergen Reaction Noted  . Ace inhibitors Swelling and Other (See Comments) 09/23/2015    Past Medical History:  Diagnosis Date  . Colon polyps    2013  . Crohn's colitis ([place])    vs UC with abscess  . Diverticulosis   . Fournier's gangrene in male 04/2016  . Gout   . Hypertension   . Perirectal abscess 04/2016  . Prolapsed internal hemorrhoid,  grade 4   . Pulmonary embolism ([place])   . Sleep apnea    cpap    Past Surgical History:  Procedure Laterality Date  . ANAL FISSURE REPAIR    . COLONOSCOPY    . HEMORRHOID SURGERY  04/21/2016   Procedure: HEMORRHOIDECTOMY;  Surgeon: Michael Boston, MD;  Location: [facility];  Service: General;;  . INCISION AND DRAINAGE ABSCESS N/A 04/21/2016   Procedure: INCISION AND DRAINAGE OF HORSESHOE  FOURNIER GANGRENE PERIRECTAL ABSCESS;  Surgeon: Michael Boston, MD;  Location: [facility];  Service: General;  Laterality: N/A;  . LYMPHADENECTOMY Bilateral 03/18/2018   Procedure: Noel Journey, PELVIC;  Surgeon: Raynelle Bring, MD;  Location: [facility];  Service: Urology;  Laterality: Bilateral;  . ROBOT ASSISTED LAPAROSCOPIC RADICAL PROSTATECTOMY N/A 03/18/2018   Procedure: XI ROBOTIC ASSISTED LAPAROSCOPIC RADICAL PROSTATECTOMY LEVEL 2; UMBILICAL HERNIA REPAIR;  Surgeon: Raynelle Bring, MD;  Location: [facility];  Service: Urology;  Laterality: N/A;    Family History  Problem Relation Age of Onset  . Pneumonia Mother   . Pneumonia Father   . Colon cancer Neg Hx   . Esophageal cancer Neg Hx   . Stomach cancer Neg Hx   . Rectal cancer Neg Hx     Social History   Socioeconomic History  . Marital status: Married    Spouse name: Not on file  . Number of children: 2  . Years of education: Not on file  . Highest education level: Not on  file  Occupational History  . Occupation: Geographical information systems officer  . Financial resource strain: Not on file  . Food insecurity:    Worry: Not on file    Inability: Not on file  . Transportation needs:    Medical: Not on file    Non-medical: Not on file  Tobacco Use  . Smoking status: Former Smoker    Types: Cigars    Last attempt to quit: 07/10/2013    Years since quitting: 4.8  . Smokeless tobacco: Never Used  Substance and Sexual Activity  . Alcohol use: No    Alcohol/week: 14.0 standard drinks    Types: 14 Cans of beer per week    Comment: 2 per day  . Drug use: No  .  Sexual activity: Not on file  Lifestyle  . Physical activity:    Days per week: Not on file    Minutes per session: Not on file  . Stress: Not on file  Relationships  . Social connections:    Talks on phone: Not on file    Gets together: Not on file    Attends religious service: Not on file    Active member of club or organization: Not on file    Attends meetings of clubs or organizations: Not on file    Relationship status: Not on file  . Intimate partner violence:    Fear of current or ex partner: Not on file    Emotionally abused: Not on file    Physically abused: Not on file    Forced sexual activity: Not on file  Other Topics Concern  . Not on file  Social History Narrative  . Not on file      Review of systems: Review of Systems  Constitutional: Negative for fever and chills.  HENT: Negative.   Eyes: Negative for blurred vision.  Respiratory: Negative for cough, shortness of breath and wheezing.   Cardiovascular: Negative for chest pain and palpitations.  Gastrointestinal: as per HPI Genitourinary: Negative for dysuria, urgency, frequency and hematuria.  Musculoskeletal: Negative for myalgias, back pain and joint pain.  Skin: Negative for itching and rash.  Neurological: Negative for dizziness, tremors, focal weakness, seizures and loss of consciousness.  Endo/Heme/Allergies: Positive for seasonal allergies.  Psychiatric/Behavioral: Negative for depression, suicidal ideas and hallucinations.  All other systems reviewed and are negative.   Physical Exam: There were no vitals filed for this visit. There is no height or weight on file to calculate BMI. Gen:      No acute distress HEENT:  EOMI, sclera anicteric Neck:     No masses; no thyromegaly Lungs:    Clear to auscultation bilaterally; normal respiratory effort CV:         Regular rate and rhythm; no murmurs Abd:      + bowel sounds; soft, non-tender; no palpable masses, no distension Ext:    No edema;  adequate peripheral perfusion Skin:      Warm and dry; no rash Neuro: alert and oriented x 3 Psych: normal mood and affect  Data Reviewed:  Reviewed labs, radiology imaging, old records and pertinent past GI work up   Assessment and Plan/Recommendations:  57 year old male with history of Crohn's colitis complicated by perirectal abscess and perianal fistula 2017, DVT and PE status post 6 months of anticoagulation, C. difficile colitis April 2017 In clinical remission on Entyvio for past 2 years.  Switch to Humira October 2019 due to difficulty with IV access and also scheduling at  infusion center Complaint induction dose of Humira and continue maintenance dose every 2 weeks Advised patient to take multivitamins, iron, folate and B12 We will check fecal calprotectin to monitor for colitis/inflammation Return in 3 months or sooner if needed  25 minutes was spent face-to-face with the patient. Greater than 50% of the time used for counseling as well as treatment plan and follow-up.  He had multiple questions which were answered to his satisfaction  K. Denzil Magnuson , MD [phone]    CC: Marton Redwood, MD

## 2018-05-17 NOTE — Patient Instructions (Signed)
Go tot he basement for your lab kit today  Continue Humira  Take Multivitamin with folic EWYB,R49 and Iron  Follow up in January    If you are age 59 or older, your body mass index should be between 23-30. Your Body mass index is 31.74 kg/m. If this is out of the aforementioned range listed, please consider follow up with your Primary Care Provider.  If you are age 14 or younger, your body mass index should be between 19-25. Your Body mass index is 31.74 kg/m. If this is out of the aformentioned range listed, please consider follow up with your Primary Care Provider.    Thank you for choosing [facility]  Karleen Hampshire Nandigam,MD

## 2018-05-28 ENCOUNTER — Encounter: Payer: Self-pay | Admitting: Gastroenterology

## 2018-05-28 ENCOUNTER — Encounter: Payer: Self-pay | Admitting: Neurology

## 2018-05-28 ENCOUNTER — Ambulatory Visit: Payer: 59 | Admitting: Neurology

## 2018-05-28 VITALS — BP 131/80 | HR 56 | Ht 71.0 in | Wt 228.0 lb

## 2018-05-28 DIAGNOSIS — E669 Obesity, unspecified: Secondary | ICD-10-CM

## 2018-05-28 DIAGNOSIS — G4733 Obstructive sleep apnea (adult) (pediatric): Secondary | ICD-10-CM | POA: Diagnosis not present

## 2018-05-28 DIAGNOSIS — R351 Nocturia: Secondary | ICD-10-CM | POA: Diagnosis not present

## 2018-05-28 DIAGNOSIS — Z86711 Personal history of pulmonary embolism: Secondary | ICD-10-CM

## 2018-05-28 NOTE — Patient Instructions (Signed)
Thank you for choosing [facility] for your sleep related care!  It was nice to meet you today! I appreciate that you entrust me with your sleep related healthcare concerns. I hope, I was able to address at least some of your concerns today, and that I can help you feel reassured and also get better.    Here is what we discussed today and what we came up with as our plan for you:    Based on your symptoms and your exam I believe you are still at risk for obstructive sleep apnea and would benefit from reevaluation as it has been many years and you need new supplies and updated machine. Therefore, I think we should proceed with a sleep study to determine how severe your sleep apnea is. We will do a home sleep test. If you have more than mild OSA, I want you to consider ongoing treatment with CPAP. Please remember, the risks and ramifications of moderate to severe obstructive sleep apnea or OSA are: Cardiovascular disease, including congestive heart failure, stroke, difficult to control hypertension, arrhythmias, and even type 2 diabetes has been linked to untreated OSA. Sleep apnea causes disruption of sleep and sleep deprivation in most cases, which, in turn, can cause recurrent headaches, problems with memory, mood, concentration, focus, and vigilance. Most people with untreated sleep apnea report excessive daytime sleepiness, which can affect their ability to drive. Please do not drive if you feel sleepy.   I will likely see you back after your sleep study to go over the test results and where to go from there. We will call you after your sleep study to advise about the results (most likely, you will hear from [place], my nurse) and to set up an appointment at the time, as necessary.    Our sleep lab administrative assistant will call you to schedule your sleep study. If you don't hear back from her by about 2 weeks from now, please feel free to call her at [phone]. You can leave a  message with your phone number and concerns, if you get the voicemail box. She will call back as soon as possible.

## 2018-05-28 NOTE — Progress Notes (Signed)
Subjective:    Patient ID: Jesse English is a 57 y.o. male.  HPI     Star Age, MD, PhD [facility] [address] [place], [place] [address]  Dear Dr. Brigitte Pulse,   I saw your patient, Jesse English, upon your kind request in my sleep clinic today for initial consultation of his sleep disorder, in particular, reevaluation of his prior diagnosis of OSA. The patient is unaccompanied today. As you know, Jesse English is a 57 year old right-handed gentleman with an underlying medical history of hypertension, gout, diverticulosis, Crohn's disease, history of PE, prior smoking, history of prostatitis, status post prostatectomy in September 2019 and obesity, who was previously diagnosed with obstructive sleep apnea and placed on CPAP therapy. Prior sleep study results are not available for my review today. Per patient, sleep study testing was in or around 2003. He has an old CPAP machine and a CPAP machine that is newer, about a year old, he did not bring his machine today and a download for compliance is not available for my review. I reviewed your office note from 10/12/2017, which you kindly included. His first machine is about 59 years old and is not working properly. He then bought a Leisure centre manager. He does not mind the variable pressure on it. He has purchased his supplies online as well. He would be willing to get reevaluated with a sleep study but would prefer a home sleep test. His bedtime is around 8:30 and he wakes up early to exercise in the mornings, he tries to walk at least 3-4 miles per day. He quit smoking about 9 years ago. He is married and lives with his wife, they have 2 children. He does not utilize alcohol, he does not drink caffeine on a regular basis.  His Past Medical History Is Significant For: Past Medical History:  Diagnosis Date  . Colon polyps    2013  . Crohn's colitis ([place])    vs UC with abscess  . Diverticulosis    . Fournier's gangrene in male 04/2016  . Gout   . Hypertension   . Perirectal abscess 04/2016  . Prolapsed internal hemorrhoid, grade 4   . Pulmonary embolism ([place])   . Sleep apnea    cpap    His Past Surgical History Is Significant For: Past Surgical History:  Procedure Laterality Date  . ANAL FISSURE REPAIR    . COLONOSCOPY    . HEMORRHOID SURGERY  04/21/2016   Procedure: HEMORRHOIDECTOMY;  Surgeon: Michael Boston, MD;  Location: [facility];  Service: General;;  . INCISION AND DRAINAGE ABSCESS N/A 04/21/2016   Procedure: INCISION AND DRAINAGE OF HORSESHOE  FOURNIER GANGRENE PERIRECTAL ABSCESS;  Surgeon: Michael Boston, MD;  Location: [facility];  Service: General;  Laterality: N/A;  . LYMPHADENECTOMY Bilateral 03/18/2018   Procedure: Noel Journey, PELVIC;  Surgeon: Raynelle Bring, MD;  Location: [facility];  Service: Urology;  Laterality: Bilateral;  . ROBOT ASSISTED LAPAROSCOPIC RADICAL PROSTATECTOMY N/A 03/18/2018   Procedure: XI ROBOTIC ASSISTED LAPAROSCOPIC RADICAL PROSTATECTOMY LEVEL 2; UMBILICAL HERNIA REPAIR;  Surgeon: Raynelle Bring, MD;  Location: [facility];  Service: Urology;  Laterality: N/A;    His Family History Is Significant For: Family History  Problem Relation Age of Onset  . Pneumonia Mother   . Pneumonia Father   . Colon cancer Neg Hx   . Esophageal cancer Neg Hx   . Stomach cancer Neg Hx   . Rectal cancer Neg Hx     His  Social History Is Significant For: Social History   Socioeconomic History  . Marital status: Married    Spouse name: Not on file  . Number of children: 2  . Years of education: Not on file  . Highest education level: Not on file  Occupational History  . Occupation: Geographical information systems officer  . Financial resource strain: Not on file  . Food insecurity:    Worry: Not on file    Inability: Not on file  . Transportation needs:    Medical: Not on file    Non-medical: Not on file  Tobacco Use  . Smoking status: Former Smoker    Types: Cigars    Last  attempt to quit: 07/10/2013    Years since quitting: 4.8  . Smokeless tobacco: Never Used  Substance and Sexual Activity  . Alcohol use: No    Alcohol/week: 14.0 standard drinks    Types: 14 Cans of beer per week    Comment: 2 per day  . Drug use: No  . Sexual activity: Not on file  Lifestyle  . Physical activity:    Days per week: Not on file    Minutes per session: Not on file  . Stress: Not on file  Relationships  . Social connections:    Talks on phone: Not on file    Gets together: Not on file    Attends religious service: Not on file    Active member of club or organization: Not on file    Attends meetings of clubs or organizations: Not on file    Relationship status: Not on file  Other Topics Concern  . Not on file  Social History Narrative  . Not on file    His Allergies Are:  Allergies  Allergen Reactions  . Ace Inhibitors Swelling and Other (See Comments)    Facial swelling and small intestinal swelling.  :   His Current Medications Are:  Outpatient Encounter Medications as of 05/28/2018  Medication Sig  . HUMIRA PEN 40 MG/0.4ML PNKT Inject 40 mg into the skin every 14 (fourteen) days.  . hydrochlorothiazide (HYDRODIURIL) 25 MG tablet Take 25 mg by mouth daily.   . rosuvastatin (CRESTOR) 10 MG tablet Take 10 mg by mouth daily.  . [DISCONTINUED] colchicine 0.6 MG tablet Take 0.6 mg by mouth 3 (three) times daily as needed.  . [DISCONTINUED] mercaptopurine (PURINETHOL) 50 MG tablet Take 50 mg by mouth daily. Give on an empty stomach 1 hour before or 2 hours after meals. Caution: Chemotherapy.  . [DISCONTINUED] vedolizumab (ENTYVIO) 300 MG injection Inject into the vein.   No facility-administered encounter medications on file as of 05/28/2018.   :  Review of Systems:  Out of a complete 14 point review of systems, all are reviewed and negative with the exception of these symptoms as listed below: Review of Systems  Neurological:       Pt presents today to  discuss his cpap. Pt has a cpap that is about 59 year old but he did not bring this cpap with him today. He also has an older cpap that is not functional. He wants a new cpap but does not want to complete another sleep study. His last sleep study was in 2003. Pt does not have a DME because he purchased his newer cpap out of pocket from an Psychologist, counselling.  Epworth Sleepiness Scale 0= would never doze 1= slight chance of dozing 2= moderate chance of dozing 3= high chance of dozing  Sitting  and reading: 0 Watching TV: 0 Sitting inactive in a public place (ex. Theater or meeting): 0 As a passenger in a car for an hour without a break: 0 Lying down to rest in the afternoon: 0 Sitting and talking to someone: 0 Sitting quietly after lunch (no alcohol): 0 In a car, while stopped in traffic: 0 Total: 0     Objective:  Neurological Exam  Physical Exam Physical Examination:   Vitals:   05/28/18 1530  BP: 131/80  Pulse: (!) 56   General Examination: The patient is a very pleasant 57 y.o. male in no acute distress. He appears well-developed and well-nourished and well groomed.   HEENT: Normocephalic, atraumatic, pupils are equal, round and reactive to light and accommodation. Extraocular tracking is good without limitation to gaze excursion or nystagmus noted. Normal smooth pursuit is noted. Hearing is grossly intact. Face is symmetric with normal facial animation and normal facial sensation. Speech is clear with no dysarthria noted. There is no hypophonia. There is no lip, neck/head, jaw or voice tremor. Neck is supple with full range of passive and active motion. There are no carotid bruits on auscultation. Oropharynx exam reveals: moderate mouth dryness, adequate dental hygiene and moderate airway crowding, due to thicker soft palate and wider uvula. Tonsils on the small side, Mallampati is class I. Neck circumference is 18-1/4 inches. Tongue protrudes centrally and palate elevates  symmetrically.   Chest: Clear to auscultation without wheezing, rhonchi or crackles noted.  Heart: S1+S2+0, regular and normal without murmurs, rubs or gallops noted.   Abdomen: Soft, non-tender and non-distended with normal bowel sounds appreciated on auscultation.  Extremities: There is trace pitting edema in the R distal leg with spider veins noted.   Skin: Warm and dry without trophic changes noted.  Musculoskeletal: exam reveals no obvious joint deformities, tenderness or joint swelling or erythema.   Neurologically:  Mental status: The patient is awake, alert and oriented in all 4 spheres. His immediate and remote memory, attention, language skills and fund of knowledge are appropriate. There is no evidence of aphasia, agnosia, apraxia or anomia. Speech is clear with normal prosody and enunciation. Thought process is linear. Mood is normal and affect is normal.  Cranial nerves II - XII are as described above under HEENT exam. In addition: shoulder shrug is normal with equal shoulder height noted. Motor exam: Normal bulk, strength and tone is noted. There is no tremor. Fine motor skills and coordination: grossly intact.  Cerebellar testing: No dysmetria or intention tremor. There is no truncal or gait ataxia.  Sensory exam: intact to light touch in the upper and lower extremities.  Gait, station and balance: He stands easily. No veering to one side is noted. No leaning to one side is noted. Posture is age-appropriate and stance is narrow based. Gait shows normal stride length and normal pace. No problems turning are noted.   Assessment and Plan:   In summary, Jesse English is a very pleasant 57 y.o.-year old male with an underlying medical history of hypertension, gout, diverticulosis, Crohn's disease, history of PE, prior smoking, history of prostatitis, status post prostatectomy in September 2019 and obesity, who Presents for evaluation of his prior diagnosis of obstructive sleep  apnea. He was diagnosed in or around 2003 and has an older CPAP machine but also purchase an AutoPap machine on his own about a year ago. A compliance download was not available. He would be willing to get reevaluated and we mutually agreed to pursue a  home sleep test. I had a long chat with the patient about my findings and the diagnosis of OSA, its prognosis and treatment options. We talked about medical treatments, surgical interventions and non-pharmacological approaches. I explained in particular the risks and ramifications of untreated moderate to severe OSA, especially with respect to developing cardiovascular disease down the Road, including congestive heart failure, difficult to treat hypertension, cardiac arrhythmias, or stroke. Even type 2 diabetes has, in part, been linked to untreated OSA. Symptoms of untreated OSA include daytime sleepiness, memory problems, mood irritability and mood disorder such as depression and anxiety, lack of energy, as well as recurrent headaches, especially morning headaches. We talked about trying to maintain a healthy lifestyle in general, as well as the importance of weight control. I encouraged the patient to eat healthy, exercise daily and keep well hydrated, to keep a scheduled bedtime and wake time routine, to not skip any meals and eat healthy snacks in between meals. I advised the patient not to drive when feeling sleepy. We will call him after his home sleep test is completed. Based on the test results I may be able to prescribe an updated PAP machine and supplies.  Thank you very much for allowing me to participate in the care of this nice patient. If I can be of any further assistance to you please do not hesitate to call me at [phone].  Sincerely,   Star Age, MD, PhD

## 2018-06-05 ENCOUNTER — Other Ambulatory Visit: Payer: 59

## 2018-06-05 DIAGNOSIS — K5010 Crohn's disease of large intestine without complications: Secondary | ICD-10-CM | POA: Diagnosis not present

## 2018-06-09 LAB — CALPROTECTIN, FECAL: Calprotectin, Fecal: 17 ug/g (ref 0–120)

## 2018-06-19 ENCOUNTER — Ambulatory Visit (INDEPENDENT_AMBULATORY_CARE_PROVIDER_SITE_OTHER): Payer: 59 | Admitting: Neurology

## 2018-06-19 DIAGNOSIS — Z86711 Personal history of pulmonary embolism: Secondary | ICD-10-CM

## 2018-06-19 DIAGNOSIS — R351 Nocturia: Secondary | ICD-10-CM

## 2018-06-19 DIAGNOSIS — G4733 Obstructive sleep apnea (adult) (pediatric): Secondary | ICD-10-CM

## 2018-06-19 DIAGNOSIS — E669 Obesity, unspecified: Secondary | ICD-10-CM

## 2018-06-21 DIAGNOSIS — C61 Malignant neoplasm of prostate: Secondary | ICD-10-CM | POA: Diagnosis not present

## 2018-06-28 DIAGNOSIS — N393 Stress incontinence (female) (male): Secondary | ICD-10-CM | POA: Diagnosis not present

## 2018-06-28 DIAGNOSIS — C61 Malignant neoplasm of prostate: Secondary | ICD-10-CM | POA: Diagnosis not present

## 2018-07-04 ENCOUNTER — Telehealth: Payer: Self-pay

## 2018-07-04 DIAGNOSIS — R0789 Other chest pain: Secondary | ICD-10-CM | POA: Diagnosis not present

## 2018-07-04 DIAGNOSIS — R079 Chest pain, unspecified: Secondary | ICD-10-CM | POA: Diagnosis not present

## 2018-07-04 DIAGNOSIS — G4733 Obstructive sleep apnea (adult) (pediatric): Secondary | ICD-10-CM

## 2018-07-04 NOTE — Telephone Encounter (Signed)
I called Jesse English. I advised Jesse English that Dr. Rexene Alberts reviewed their sleep study results and found that Jesse English has mild osa. Dr. Rexene Alberts recommends that Jesse English maintain auto pap use. I reviewed PAP compliance expectations with the Jesse English. Jesse English is agreeable to starting an auto-PAP and reports that he has a fairly new auto pap at home, and he wants a travel auto pap. I advised Jesse English that an order will be sent to a DME, [facility], and [facility] will call the Jesse English within about one week after they file with the Jesse English's insurance. [facility] will show the Jesse English how to use the machine, fit for masks, and troubleshoot the auto-PAP if needed. A follow up appt was made for insurance purposes with Hoyle Sauer, NP on 09/24/18 at 7:45am. Jesse English verbalized understanding to arrive 15 minutes early and bring their auto-PAP. A letter with all of this information in it will be mailed to the Jesse English as a reminder. I verified with the Jesse English that the address we have on file is correct. Jesse English verbalized understanding of results. Jesse English had no questions at this time but was encouraged to call back if questions arise. I have sent the order to [facility] and have received confirmation that they have received the order.

## 2018-07-04 NOTE — Addendum Note (Signed)
Addended by: Star Age on: 07/04/2018 02:51 PM   Modules accepted: Orders

## 2018-07-04 NOTE — Telephone Encounter (Signed)
-----   Message from Star Age, MD sent at 07/04/2018  2:50 PM EST ----- Patient referred by Dr. Brigitte Pulse, seen by me on 05/28/18, HST on 06/19/18.    Please call and notify the patient that the recent home sleep test confirmed mild obstructive sleep apnea. Since he has benefited from using CPAP and autoPAP and we can maintain treatment with autoPAP with a new machine at home, through a DME company (of his choice, or as per insurance requirement). I have placed an order in the chart. Please send referral, talk to patient, send report to referring MD. We will need a FU in sleep clinic for 10 weeks post-PAP set up, please arrange that with me or one of our NPs. Thanks,   Star Age, MD, PhD [facility])

## 2018-07-04 NOTE — Procedures (Signed)
[facility] [address] [place], [place] [address] NAME:   Jesse English                                                                          DOB: 06/17/1961 MEDICAL RECORD No:  [id number]                                                        DOS:  06/19/2018 REFERRING PHYSICIAN: Marton [place], MD STUDY PERFORMED: Home Sleep Test on [facility] HISTORY: 57 year old man with a history of hypertension, gout, diverticulosis, Crohn's disease, a history of PE, prior smoking, history of prostatitis, status post prostatectomy in September 2019 and obesity, who was previously diagnosed with obstructive sleep apnea and placed on CPAP therapy. He presents for re-evaluation. He needs a new machine and new supplies.   STUDY RESULTS:   Total Recording Time: 7 hrs, 33 mins; Total Sleep Time: 5 hrs, 58 mins Total Apnea/Hypopnea Index (AHI): 10.4/h; RDI: 13.0/h; REM AHI: 23.1/h Average Oxygen Saturation:  95%; Lowest Oxygen Desaturation: 92%  Total Time Oxygen-Saturation Below or at 88%: 0 minutes  Average Heart Rate: 57 bpm  IMPRESSION: OSA RECOMMENDATION: This home sleep test demonstrates overall mild obstructive sleep apnea (by number of events) with a total AHI of 10.4/hour and O2 nadir of 92%. Given the patient's medical history and sleep related complaints, and benefit with positive airway pressure treatment for the past years, treatment with positive airway pressure in the form of autoPAP can be considered and will be offered to the patient. Other treatment options may include weight loss along with avoidance of the supine sleep position, an oral appliance through a qualified dentist, airway surgical procedures (through ENT). Please note that untreated obstructive sleep apnea may carry additional perioperative morbidity. Patients with significant obstructive sleep apnea should receive perioperative PAP therapy and the surgeons and particularly the anesthesiologist  should be informed of the diagnosis and the severity of the sleep disordered breathing. The patient should be cautioned not to drive, work at heights, or operate dangerous or heavy equipment when tired or sleepy. Review and reiteration of good sleep hygiene measures should be pursued with any patient. Other causes of the patient's symptoms, including circadian rhythm disturbances, an underlying mood disorder, medication effect and/or an underlying medical problem cannot be ruled out based on this test. Clinical correlation is recommended. The patient and his referring provider will be notified of the test results. The patient will be seen in follow up in sleep clinic at [facility]. I certify that I have reviewed the raw data recording prior to the issuance of this report in accordance with the standards of the American Academy of Sleep Medicine (AASM).   Star Age, MD, PhD [facility]) Diplomat, ABPN (Neurology and Sleep)

## 2018-07-04 NOTE — Progress Notes (Signed)
Patient referred by Dr. Brigitte Pulse, seen by me on 05/28/18, HST on 06/19/18.    Please call and notify the patient that the recent home sleep test confirmed mild obstructive sleep apnea. Since he has benefited from using CPAP and autoPAP and we can maintain treatment with autoPAP with a new machine at home, through a DME company (of his choice, or as per insurance requirement). I have placed an order in the chart. Please send referral, talk to patient, send report to referring MD. We will need a FU in sleep clinic for 10 weeks post-PAP set up, please arrange that with me or one of our NPs. Thanks,   Star Age, MD, PhD [facility])

## 2018-07-15 ENCOUNTER — Encounter

## 2018-07-15 ENCOUNTER — Institutional Professional Consult (permissible substitution): Payer: Self-pay | Admitting: Neurology

## 2018-07-19 NOTE — Telephone Encounter (Signed)
Pt is asking for a call from [place] for the prescription for his Auto Pap, he does not want to get it thru [facility]. Pt would like the Rx in hand to get the devise on his own, please call

## 2018-07-19 NOTE — Telephone Encounter (Signed)
Travel autoPAP order placed.

## 2018-07-19 NOTE — Addendum Note (Signed)
Addended by: Star Age on: 07/19/2018 11:42 AM   Modules accepted: Orders

## 2018-07-19 NOTE — Telephone Encounter (Signed)
I called pt. He is asking for a travel cpap order so he may purchase one online since [facility] does not supply travel cpaps. He has a normal sized cpap that he uses at home when he is not traveling. I advised him that I will let him know when this RX is ready for pick up. Pt verbalized understanding.

## 2018-07-22 NOTE — Telephone Encounter (Signed)
I called pt, advised him that the travel auto pap order is ready for pick up at the front desk. Pt verbalized understanding.

## 2018-08-05 DIAGNOSIS — D2262 Melanocytic nevi of left upper limb, including shoulder: Secondary | ICD-10-CM | POA: Diagnosis not present

## 2018-08-05 DIAGNOSIS — L821 Other seborrheic keratosis: Secondary | ICD-10-CM | POA: Diagnosis not present

## 2018-08-05 DIAGNOSIS — D225 Melanocytic nevi of trunk: Secondary | ICD-10-CM | POA: Diagnosis not present

## 2018-09-24 ENCOUNTER — Ambulatory Visit: Payer: Self-pay | Admitting: Nurse Practitioner

## 2018-10-21 DIAGNOSIS — E7849 Other hyperlipidemia: Secondary | ICD-10-CM | POA: Diagnosis not present

## 2018-10-21 DIAGNOSIS — Z0000 Encounter for general adult medical examination without abnormal findings: Secondary | ICD-10-CM | POA: Diagnosis not present

## 2018-10-28 DIAGNOSIS — Z0000 Encounter for general adult medical examination without abnormal findings: Secondary | ICD-10-CM | POA: Diagnosis not present

## 2018-10-30 ENCOUNTER — Other Ambulatory Visit: Payer: Self-pay | Admitting: Internal Medicine

## 2018-10-30 DIAGNOSIS — F17201 Nicotine dependence, unspecified, in remission: Secondary | ICD-10-CM

## 2018-11-29 ENCOUNTER — Other Ambulatory Visit: Payer: Self-pay | Admitting: Gastroenterology

## 2019-05-13 ENCOUNTER — Telehealth: Payer: Self-pay

## 2019-05-13 NOTE — Telephone Encounter (Signed)
New prior authorization needed for the patient's Humira prescription. Patient is also due follow up visit and updating of his labs. Spoke with the patient. States he is doing well. Admits he may not be on a "perfect schedule" of taking the Humira as prescribed but he tries and has not had any symptoms.  Appointment 05/27/19.  PA through "Cover My Meds" for [employer] [facility] started.

## 2019-05-14 NOTE — Telephone Encounter (Signed)
Humira has been approved from 05/13/2019 until 05/12/2020 Reference PA [id number]

## 2019-05-27 ENCOUNTER — Ambulatory Visit: Payer: 59 | Admitting: Gastroenterology

## 2019-05-27 ENCOUNTER — Encounter: Payer: Self-pay | Admitting: Gastroenterology

## 2019-05-27 ENCOUNTER — Other Ambulatory Visit: Payer: Self-pay

## 2019-05-27 VITALS — BP 122/78 | HR 57 | Temp 97.6°F | Ht 71.0 in | Wt 221.0 lb

## 2019-05-27 DIAGNOSIS — K5010 Crohn's disease of large intestine without complications: Secondary | ICD-10-CM | POA: Diagnosis not present

## 2019-05-27 DIAGNOSIS — Z23 Encounter for immunization: Secondary | ICD-10-CM | POA: Diagnosis not present

## 2019-05-27 NOTE — Progress Notes (Signed)
Jesse English    [id number]    Sep 06, 1960  Primary Care Physician:Shaw, Gwyndolyn Saxon, MD  Referring Physician: Marton Redwood, MD [address] [place],  [place] [address]   Chief complaint:    HPI: 58 year old male with history of Crohn's disease complicated by perianal abscess and fistula, was in clinical remission on Entyvio previously, switched to Humira last year due to difficulty with IV access. He is doing well overall on Humira, remains in clinical remission. Denies any nausea, vomiting, abdominal pain, melena or bright red blood per rectum He is having 1 soft bowel movement daily.  Denies any change in bowel habits, weight loss or decreased appetite  Last office visit November 2019  TB Quantiferon 11/22/2017: Negative  No family history of colon cancer  Outpatient Encounter Medications as of 05/27/2019  Medication Sig  . HUMIRA PEN 40 MG/0.4ML PNKT INJECT 40MG  SUBCUTANEOUSLY  EVERY 2 WEEKS  . hydrochlorothiazide (HYDRODIURIL) 25 MG tablet Take 25 mg by mouth daily.   . rosuvastatin (CRESTOR) 10 MG tablet Take 10 mg by mouth daily.   No facility-administered encounter medications on file as of 05/27/2019.     Allergies as of 05/27/2019 - Review Complete 05/27/2019  Allergen Reaction Noted  . Ace inhibitors Swelling and Other (See Comments) 09/23/2015    Past Medical History:  Diagnosis Date  . Colon polyps    2013  . Crohn's colitis ([place])    vs UC with abscess  . Diverticulosis   . Fournier's gangrene in male 04/2016  . Gout   . Hypertension   . Perirectal abscess 04/2016  . Prolapsed internal hemorrhoid, grade 4   . Pulmonary embolism ([place])   . Sleep apnea    cpap    Past Surgical History:  Procedure Laterality Date  . ANAL FISSURE REPAIR    . COLONOSCOPY    . HEMORRHOID SURGERY  04/21/2016   Procedure: HEMORRHOIDECTOMY;  Surgeon: Michael Boston, MD;  Location: [facility];  Service: General;;  . INCISION AND DRAINAGE ABSCESS N/A 04/21/2016    Procedure: INCISION AND DRAINAGE OF HORSESHOE  FOURNIER GANGRENE PERIRECTAL ABSCESS;  Surgeon: Michael Boston, MD;  [facility]: [facility];  Service: General;  Laterality: N/A;  . LYMPHADENECTOMY Bilateral 03/18/2018   Procedure: Noel Journey, PELVIC;  Surgeon: Raynelle Bring, MD;  Location: [facility];  Service: Urology;  Laterality: Bilateral;  . ROBOT ASSISTED LAPAROSCOPIC RADICAL PROSTATECTOMY N/A 03/18/2018   Procedure: XI ROBOTIC ASSISTED LAPAROSCOPIC RADICAL PROSTATECTOMY LEVEL 2; UMBILICAL HERNIA REPAIR;  Surgeon: Raynelle Bring, MD;  [facility];  Service: Urology;  Laterality: N/A;    Family History  Problem Relation Age of Onset  . Pneumonia Mother   . Pneumonia Father   . Colon cancer Neg Hx   . Esophageal cancer Neg Hx   . Stomach cancer Neg Hx   . Rectal cancer Neg Hx     Social History   Socioeconomic History  . Marital status: Married    Spouse name: Not on file  . Number of children: 2  . Years of education: Not on file  . Highest education level: Not on file  Occupational History  . Occupation: Geographical information systems officer  . Financial resource strain: Not on file  . Food insecurity    Worry: Not on file    Inability: Not on file  . Transportation needs    Medical: Not on file    Non-medical: Not on  file  Tobacco Use  . Smoking status: Former Smoker    Types: Cigars    Quit date: 07/10/2013    Years since quitting: 5.8  . Smokeless tobacco: Never Used  Substance and Sexual Activity  . Alcohol use: No    Alcohol/week: 14.0 standard drinks    Types: 14 Cans of beer per week    Comment: 2 per day  . Drug use: No  . Sexual activity: Not on file  Lifestyle  . Physical activity    Days per week: Not on file    Minutes per session: Not on file  . Stress: Not on file  Relationships  . Social Herbalist on phone: Not on file    Gets together: Not on file    Attends religious service: Not on file    Active member of club or organization: Not on file     Attends meetings of clubs or organizations: Not on file    Relationship status: Not on file  . Intimate partner violence    Fear of current or ex partner: Not on file    Emotionally abused: Not on file    Physically abused: Not on file    Forced sexual activity: Not on file  Other Topics Concern  . Not on file  Social History Narrative  . Not on file      Review of systems: Review of Systems  Constitutional: Negative for fever and chills.  HENT: Negative.   Eyes: Negative for blurred vision.  Respiratory: Negative for cough, shortness of breath and wheezing.   Cardiovascular: Negative for chest pain and palpitations.  Gastrointestinal: as per HPI Genitourinary: Negative for dysuria, urgency, frequency and hematuria.  Musculoskeletal: Negative for myalgias, back pain and joint pain.  Skin: Negative for itching and rash.  Neurological: Negative for dizziness, tremors, focal weakness, seizures and loss of consciousness.  Endo/Heme/Allergies: Positive for seasonal allergies.  Psychiatric/Behavioral: Negative for depression, suicidal ideas and hallucinations.  All other systems reviewed and are negative.   Physical Exam: Vitals:   05/27/19 1107  Temp: 97.6 F (36.4 C)   Body mass index is 30.82 kg/m. Gen:      No acute distress HEENT:  EOMI, sclera anicteric Neck:     No masses; no thyromegaly Lungs:    Clear to auscultation bilaterally; normal respiratory effort CV:         Regular rate and rhythm; no murmurs Abd:      + bowel sounds; soft, non-tender; no palpable masses, no distension Ext:    No edema; adequate peripheral perfusion Skin:      Warm and dry; no rash Neuro: alert and oriented x 3 Psych: normal mood and affect  Data Reviewed:  Reviewed labs, radiology imaging, old records and pertinent past GI work up   Assessment and Plan/Recommendations: 58 year old male with history of C. difficile colitis April 2017, Crohn's colitis complicated by perirectal  abscess and perianal fistula 2017 DVT and PE s/p 6 months anticoagulation Prostate carcinoma s/p radical prostatectomy with lymphadenectomy September 2019  Crohn's disease in clinical remission Continue Humira every 2 weeks for maintenance  Check Humira drug trough and antibody level  IBD Health Care Maintenance:  Check CBC, CMP, CRP, iron panel with ferritin, B12, folate and CRP  He is due for annual TB screening, check TB QuantiFERON gold  Annual Flu Vaccine -we will plan to give it today  Pneumococcal Vaccine on immunosuppressive therapy, received PPSV23 05/18/2016 Will plan for PCV  82 today   Last Colonoscopy -  01/11/2017 with no active inflammation or dysplasia Recall colonoscopy due July 2025  Return in 6 months or sooner if needed  25 minutes was spent face-to-face with the patient. Greater than 50% of the time used for counseling as well as treatment plan and follow-up.   Damaris Hippo , MD    CC: Marton Redwood, MD

## 2019-05-27 NOTE — Patient Instructions (Signed)
You have been given a Pneumovax 13 and Flu shot today  You will need to go to the basement for labs on 06/09/2019 and will need to pick up a Drug Trough kit from the third floor before you go down for your lab work on 06/09/2019  Follow up in 6 months  If you are age 60 or older, your body mass index should be between 23-30. Your Body mass index is 30.82 kg/m. If this is out of the aforementioned range listed, please consider follow up with your Primary Care Provider.  If you are age 67 or younger, your body mass index should be between 19-25. Your Body mass index is 30.82 kg/m. If this is out of the aformentioned range listed, please consider follow up with your Primary Care Provider.    I appreciate the  opportunity to care for you  Thank You   Harl Bowie , MD

## 2019-06-13 ENCOUNTER — Other Ambulatory Visit: Payer: Self-pay

## 2019-06-13 DIAGNOSIS — Z20822 Contact with and (suspected) exposure to covid-19: Secondary | ICD-10-CM

## 2019-06-15 LAB — NOVEL CORONAVIRUS, NAA: SARS-CoV-2, NAA: DETECTED — AB

## 2019-06-16 ENCOUNTER — Telehealth: Payer: Self-pay | Admitting: Nurse Practitioner

## 2019-06-16 NOTE — Telephone Encounter (Signed)
Called to Discuss with patient about Covid symptoms and the use of bamlanivimab, a monoclonal antibody infusion for those with mild to moderate Covid symptoms and at a high risk of hospitalization.     Pt is qualified for this infusion at the [facility] infusion center due to co-morbid conditions and/or a member of an at-risk group.    Patient is managed for the following: Patient Active Problem List   Diagnosis Date Noted  . Prostate cancer ([place]) 03/18/2018  . Crohn's colitis, other complication ([place]) 0000000  . Lung nodule 07/12/2016  . DVT (deep venous thrombosis) ([place]) 07/12/2016  . Pulmonary embolus ([place]) 05/20/2016  . Rectal fistula 05/20/2016  . Peri-rectal abscess 04/20/2016  . Crohn's disease ([place]) 12/22/2015   Patient is currently taking mediation for Crohn's that is immunosuppressive.   Patient will think about and call us back if he decides that he wants the infusion.

## 2019-06-18 ENCOUNTER — Telehealth: Payer: Self-pay | Admitting: Nurse Practitioner

## 2019-06-18 NOTE — Telephone Encounter (Signed)
Call returned to patient, confirmed DOB, he states he does want to do the infusions. I made him aware I would let TN know and she would get back with him. Voiced understanding.   TN, pt on board for covid infusions. He asked that you give him a call. Thanks.

## 2019-06-19 NOTE — Telephone Encounter (Signed)
Patient is returning the call. CB is [phone].

## 2019-06-24 ENCOUNTER — Telehealth: Payer: Self-pay | Admitting: Unknown Physician Specialty

## 2019-06-24 NOTE — Telephone Encounter (Signed)
Called to discuss with patient about Covid symptoms and the use of bamlanivimab, a monoclonal antibody infusion for those with mild to moderate Covid symptoms and at a high risk of hospitalization.  Last week declined treatment but called the call center reconsidering.

## 2019-06-25 ENCOUNTER — Telehealth: Payer: Self-pay | Admitting: Nurse Practitioner

## 2019-06-25 NOTE — Telephone Encounter (Signed)
Called to Discuss with patient about Covid symptoms and the use of bamlanivimab, a monoclonal antibody infusion for those with mild to moderate Covid symptoms and at a high risk of hospitalization.     Pt is qualified for this infusion at the [facility] infusion center due to co-morbid conditions and/or a member of an at-risk group.     Tried to return call. Unable to reach pt. Left message for call back.

## 2019-06-25 NOTE — Telephone Encounter (Signed)
Patient returned my call about bamlanivimab infusion. He was very upset because we were not able to get back in touch with him until now. I apologized and told him that the office did send me a message that he had called back, but unfortunately I did not see that message until today. I was out of the office yesterday and over the weekend. Patient is out of the window for infusion at this time. He states that he has improved and is no longer having any symptoms.

## 2019-08-19 ENCOUNTER — Other Ambulatory Visit: Payer: Self-pay | Admitting: Gastroenterology

## 2020-03-05 ENCOUNTER — Other Ambulatory Visit: Payer: Self-pay | Admitting: Gastroenterology

## 2020-05-28 ENCOUNTER — Telehealth: Payer: Self-pay

## 2020-05-28 ENCOUNTER — Other Ambulatory Visit: Payer: Self-pay

## 2020-05-28 DIAGNOSIS — K50118 Crohn's disease of large intestine with other complication: Secondary | ICD-10-CM

## 2020-05-28 NOTE — Telephone Encounter (Signed)
Jesse English Jesse English - PA Case ID: [id number] - Rx #: [id number] Prior authorization started for Humira Pen 35m/0.4ml

## 2020-05-28 NOTE — Telephone Encounter (Signed)
Humira approved. Patient aware. Agrees to come for his yearly labs today or next week. An appointment for follow up of IBD scheduled for 07/16/20.

## 2020-05-31 ENCOUNTER — Other Ambulatory Visit: Payer: 59

## 2020-05-31 DIAGNOSIS — K50118 Crohn's disease of large intestine with other complication: Secondary | ICD-10-CM

## 2020-05-31 LAB — COMPREHENSIVE METABOLIC PANEL
ALT: 18 U/L (ref 0–53)
AST: 24 U/L (ref 0–37)
Albumin: 4 g/dL (ref 3.5–5.2)
Alkaline Phosphatase: 32 U/L — ABNORMAL LOW (ref 39–117)
BUN: 22 mg/dL (ref 6–23)
CO2: 28 mEq/L (ref 19–32)
Calcium: 8.9 mg/dL (ref 8.4–10.5)
Chloride: 102 mEq/L (ref 96–112)
Creatinine, Ser: 1.24 mg/dL (ref 0.40–1.50)
GFR: 62.59 mL/min (ref >60.00)
Glucose, Bld: 100 mg/dL — ABNORMAL HIGH (ref 70–99)
Potassium: 3.4 mEq/L — ABNORMAL LOW (ref 3.5–5.1)
Sodium: 137 mEq/L (ref 135–145)
Total Bilirubin: 1.9 mg/dL — ABNORMAL HIGH (ref 0.2–1.2)
Total Protein: 7.1 g/dL (ref 6.0–8.3)

## 2020-05-31 LAB — CBC WITH DIFFERENTIAL/PLATELET
Basophils Absolute: 0 10*3/uL (ref 0.0–0.1)
Basophils Relative: 0.8 % (ref 0.0–3.0)
Eosinophils Absolute: 0.1 10*3/uL (ref 0.0–0.7)
Eosinophils Relative: 1.3 % (ref 0.0–5.0)
HCT: 40.1 % (ref 39.0–52.0)
Hemoglobin: 13.6 g/dL (ref 13.0–17.0)
Lymphocytes Relative: 25.6 % (ref 12.0–46.0)
Lymphs Abs: 1.2 10*3/uL (ref 0.7–4.0)
MCHC: 33.9 g/dL (ref 30.0–36.0)
MCV: 91 fl (ref 78.0–100.0)
Monocytes Absolute: 0.5 10*3/uL (ref 0.1–1.0)
Monocytes Relative: 11.1 % (ref 3.0–12.0)
Neutro Abs: 2.9 10*3/uL (ref 1.4–7.7)
Neutrophils Relative %: 61.2 % (ref 43.0–77.0)
Platelets: 167 10*3/uL (ref 150.0–400.0)
RBC: 4.41 Mil/uL (ref 4.22–5.81)
RDW: 13.3 % (ref 11.5–15.5)
WBC: 4.8 10*3/uL (ref 4.0–10.5)

## 2020-05-31 LAB — VITAMIN B12: Vitamin B-12: 400 pg/mL (ref 211–911)

## 2020-05-31 LAB — HIGH SENSITIVITY CRP: CRP, High Sensitivity: 0.84 mg/L (ref 0.000–5.000)

## 2020-06-02 LAB — QUANTIFERON-TB GOLD PLUS
Mitogen-NIL: >10 IU/mL
NIL: 0.1 IU/mL
QuantiFERON-TB Gold Plus: NEGATIVE
TB1-NIL: <0 IU/mL
TB2-NIL: <0 IU/mL

## 2020-06-02 LAB — IRON,TIBC AND FERRITIN PANEL
%SAT: 47 % (calc) (ref 20–48)
Ferritin: 82 ng/mL (ref 24–380)
Iron: 137 ug/dL (ref 50–180)
TIBC: 290 mcg/dL (calc) (ref 250–425)

## 2020-07-16 ENCOUNTER — Ambulatory Visit: Payer: 59 | Admitting: Gastroenterology

## 2020-07-16 ENCOUNTER — Encounter: Payer: Self-pay | Admitting: Gastroenterology

## 2020-07-16 VITALS — BP 140/70 | HR 61 | Ht 71.0 in | Wt 213.0 lb

## 2020-07-16 DIAGNOSIS — K5180 Other ulcerative colitis without complications: Secondary | ICD-10-CM

## 2020-07-16 DIAGNOSIS — K5010 Crohn's disease of large intestine without complications: Secondary | ICD-10-CM

## 2020-07-16 DIAGNOSIS — K529 Noninfective gastroenteritis and colitis, unspecified: Secondary | ICD-10-CM | POA: Diagnosis not present

## 2020-07-16 NOTE — Patient Instructions (Signed)
Take one a day 50+ multivitamin   Follow up in 1 year  If you are age 62 or older, your body mass index should be between 23-30. Your Body mass index is 29.71 kg/m. If this is out of the aforementioned range listed, please consider follow up with your Primary Care Provider.  If you are age 48 or younger, your body mass index should be between 19-25. Your Body mass index is 29.71 kg/m. If this is out of the aformentioned range listed, please consider follow up with your Primary Care Provider.    Due to recent changes in healthcare laws, you may see the results of your imaging and laboratory studies on MyChart before your provider has had a chance to review them.  We understand that in some cases there may be results that are confusing or concerning to you. Not all laboratory results come back in the same time frame and the provider may be waiting for multiple results in order to interpret others.  Please give [place] 48 hours in order for your provider to thoroughly review all the results before contacting the office for clarification of your results.   I appreciate the  opportunity to care for you  Thank You   Harl Bowie , MD

## 2020-07-16 NOTE — Progress Notes (Signed)
Jesse English    [id number]    [birth year]  Primary Care Physician:Shaw, Gwyndolyn Saxon, MD  Referring Physician: Marton Redwood, MD [address] [place],  [place] [address]   Chief complaint: Crohn's disease/IBD HPI:  60 year old very pleasant gentleman with history of Crohn's disease complicated by perianal abscess and fistula currently in clinical remission on Humira  He was previously on Entyvio, was switched to Humira as he was having difficulty with IV placement for infusions every 2 months.  Overall he is doing well. Denies any nausea, vomiting, abdominal pain, change in bowel habits melena or bright red blood per rectum    Outpatient Encounter Medications as of 07/16/2020  Medication Sig  . HUMIRA PEN 40 MG/0.4ML PNKT INJECT 40MG  SUBCUTANEOUSLY  EVERY 2 WEEKS  . hydrochlorothiazide (HYDRODIURIL) 25 MG tablet Take 25 mg by mouth daily.   . rosuvastatin (CRESTOR) 10 MG tablet Take 10 mg by mouth daily.   No facility-administered encounter medications on file as of 07/16/2020.    Allergies as of 07/16/2020 - Review Complete 07/16/2020  Allergen Reaction Noted  . Ace inhibitors Swelling and Other (See Comments) 09/23/2015    Past Medical History:  Diagnosis Date  . Colon polyps    2013  . Crohn's colitis ([place])    vs UC with abscess  . Diverticulosis   . Fournier's gangrene in male 04/2016  . Gout   . Hypertension   . Perirectal abscess 04/2016  . Prolapsed internal hemorrhoid, grade 4   . Pulmonary embolism ([place])   . Sleep apnea    cpap    Past Surgical History:  Procedure Laterality Date  . ANAL FISSURE REPAIR    . COLONOSCOPY    . HEMORRHOID SURGERY  04/21/2016   Procedure: HEMORRHOIDECTOMY;  Surgeon: Michael Boston, MD;  [facility];  Service: General;;  . INCISION AND DRAINAGE ABSCESS N/A 04/21/2016   Procedure: INCISION AND DRAINAGE OF HORSESHOE  FOURNIER GANGRENE PERIRECTAL ABSCESS;  Surgeon: Michael Boston, MD;  [facility];  Service:  General;  Laterality: N/A;  . LYMPHADENECTOMY Bilateral 03/18/2018   Procedure: Noel Journey, PELVIC;  Surgeon: Raynelle Bring, MD;  [facility];  Service: Urology;  Laterality: Bilateral;  . ROBOT ASSISTED LAPAROSCOPIC RADICAL PROSTATECTOMY N/A 03/18/2018   Procedure: XI ROBOTIC ASSISTED LAPAROSCOPIC RADICAL PROSTATECTOMY LEVEL 2; UMBILICAL HERNIA REPAIR;  Surgeon: Raynelle Bring, MD;  [facility];  Service: Urology;  Laterality: N/A;    Family History  Problem Relation Age of Onset  . Pneumonia Mother   . Pneumonia Father   . Colon cancer Neg Hx   . Esophageal cancer Neg Hx   . Stomach cancer Neg Hx   . Rectal cancer Neg Hx     Social History   Socioeconomic History  . Marital status: Married    Spouse name: Not on file  . Number of children: 2  . Years of education: Not on file  . Highest education level: Not on file  Occupational History  . Occupation: Press photographer  Tobacco Use  . Smoking status: Former Smoker    Types: Cigars    Quit date: 07/10/2013    Years since quitting: 7.0  . Smokeless tobacco: Never Used  Vaping Use  . Vaping Use: Never used  Substance and Sexual Activity  . Alcohol use: Yes    Alcohol/week: 14.0 standard drinks    Types: 14 Cans of beer per week    Comment: very little  . Drug  use: No  . Sexual activity: Not on file  Other Topics Concern  . Not on file  Social History Narrative  . Not on file   Social Determinants of Health   Financial Resource Strain: Not on file  Food Insecurity: Not on file  Transportation Needs: Not on file  Physical Activity: Not on file  Stress: Not on file  Social Connections: Not on file  Intimate Partner Violence: Not on file      Review of systems: All other review of systems negative except as mentioned in the HPI.   Physical Exam: Vitals:   07/16/20 0819  BP: 140/70  Pulse: 61   Body mass index is 29.71 kg/m. Gen:      No acute distress HEENT:  sclera anicteric Abd:      soft,  non-tender; no palpable masses, no distension Ext:    No edema Neuro: alert and oriented x 3 Psych: normal mood and affect  Data Reviewed:  Reviewed labs, radiology imaging, old records and pertinent past GI work up   Assessment and Plan/Recommendations:  60 year old very pleasant gentleman with history of Crohn's colitis complicated by perianal abscess and fistula in 2017.  C. difficile colitis in 2017 History of DVT and PE status post anticoagulation in 2017 for 6 months History of prostate carcinoma s/p radical prostatectomy September 2019  Crohn's disease: Clinical remission on maintenance therapy with Humira every 2 weeks  Reviewed labs within normal range.  Normal CRP with no evidence of active inflammation Advised patient to start multivitamin with B12 for borderline low B12 level to prevent B12 deficiency  TB QuantiFERON gold negative in November 2021  He is up-to-date with vaccinations including influenza and COVID-19  Last Colonoscopy -  01/11/2017 with no active inflammation or dysplasia Recall colonoscopy due July 2025  Return in 1 year or sooner if needed   The patient was provided an opportunity to ask questions and all were answered. The patient agreed with the plan and demonstrated an understanding of the instructions.  Damaris Hippo , MD    CC: Marton Redwood, MD

## 2020-07-29 ENCOUNTER — Encounter: Payer: Self-pay | Admitting: Gastroenterology

## 2020-09-20 ENCOUNTER — Other Ambulatory Visit: Payer: Self-pay | Admitting: Gastroenterology

## 2021-01-20 ENCOUNTER — Other Ambulatory Visit: Payer: Self-pay | Admitting: Internal Medicine

## 2021-01-20 DIAGNOSIS — F17201 Nicotine dependence, unspecified, in remission: Secondary | ICD-10-CM

## 2021-02-09 ENCOUNTER — Other Ambulatory Visit: Payer: Self-pay

## 2021-02-09 ENCOUNTER — Ambulatory Visit
Admission: RE | Admit: 2021-02-09 | Discharge: 2021-02-09 | Disposition: A | Discharge status: Home / Self Care | Payer: 59 | Source: Ambulatory Visit | Attending: Internal Medicine | Admitting: Internal Medicine

## 2021-02-09 DIAGNOSIS — F17201 Nicotine dependence, unspecified, in remission: Secondary | ICD-10-CM

## 2021-03-23 ENCOUNTER — Other Ambulatory Visit: Payer: Self-pay | Admitting: Family Medicine

## 2021-03-23 DIAGNOSIS — M25561 Pain in right knee: Secondary | ICD-10-CM

## 2021-03-29 ENCOUNTER — Other Ambulatory Visit: Payer: Self-pay | Admitting: Gastroenterology

## 2021-03-31 ENCOUNTER — Other Ambulatory Visit: Payer: Self-pay

## 2021-03-31 ENCOUNTER — Ambulatory Visit
Admission: RE | Admit: 2021-03-31 | Discharge: 2021-03-31 | Disposition: A | Discharge status: Home / Self Care | Payer: 59 | Source: Ambulatory Visit | Attending: Family Medicine | Admitting: Family Medicine

## 2021-03-31 DIAGNOSIS — M25561 Pain in right knee: Secondary | ICD-10-CM

## 2021-05-31 ENCOUNTER — Telehealth: Payer: Self-pay | Admitting: Gastroenterology

## 2021-05-31 ENCOUNTER — Other Ambulatory Visit: Payer: Self-pay

## 2021-05-31 MED ORDER — HUMIRA (2 PEN) 40 MG/0.4ML ~~LOC~~ AJKT: AUTO-INJECTOR | SUBCUTANEOUS | 6 refills | Status: DC

## 2021-05-31 NOTE — Telephone Encounter (Signed)
Inbound call from patient states he need refill for humira and need prior autho

## 2021-07-19 ENCOUNTER — Other Ambulatory Visit (INDEPENDENT_AMBULATORY_CARE_PROVIDER_SITE_OTHER): Payer: 59

## 2021-07-19 ENCOUNTER — Encounter: Payer: Self-pay | Admitting: Gastroenterology

## 2021-07-19 ENCOUNTER — Ambulatory Visit: Payer: 59 | Admitting: Gastroenterology

## 2021-07-19 VITALS — BP 130/70 | HR 64 | Ht 71.0 in | Wt 220.2 lb

## 2021-07-19 DIAGNOSIS — Z79620 Long term (current) use of immunosuppressive biologic: Secondary | ICD-10-CM | POA: Diagnosis not present

## 2021-07-19 DIAGNOSIS — E669 Obesity, unspecified: Secondary | ICD-10-CM

## 2021-07-19 DIAGNOSIS — K5010 Crohn's disease of large intestine without complications: Secondary | ICD-10-CM

## 2021-07-19 LAB — CBC WITH DIFFERENTIAL/PLATELET
Basophils Absolute: 0 10*3/uL (ref 0.0–0.1)
Basophils Relative: 0.6 % (ref 0.0–3.0)
Eosinophils Absolute: 0.1 10*3/uL (ref 0.0–0.7)
Eosinophils Relative: 1.2 % (ref 0.0–5.0)
HCT: 40.9 % (ref 39.0–52.0)
Hemoglobin: 13.7 g/dL (ref 13.0–17.0)
Lymphocytes Relative: 17.7 % (ref 12.0–46.0)
Lymphs Abs: 1.1 10*3/uL (ref 0.7–4.0)
MCHC: 33.5 g/dL (ref 30.0–36.0)
MCV: 90.5 fl (ref 78.0–100.0)
Monocytes Absolute: 0.8 10*3/uL (ref 0.1–1.0)
Monocytes Relative: 12.9 % — ABNORMAL HIGH (ref 3.0–12.0)
Neutro Abs: 4.3 10*3/uL (ref 1.4–7.7)
Neutrophils Relative %: 67.6 % (ref 43.0–77.0)
Platelets: 165 10*3/uL (ref 150.0–400.0)
RBC: 4.52 Mil/uL (ref 4.22–5.81)
RDW: 14.3 % (ref 11.5–15.5)
WBC: 6.4 10*3/uL (ref 4.0–10.5)

## 2021-07-19 LAB — COMPREHENSIVE METABOLIC PANEL
ALT: 25 U/L (ref 0–53)
AST: 30 U/L (ref 0–37)
Albumin: 4.1 g/dL (ref 3.5–5.2)
Alkaline Phosphatase: 40 U/L (ref 39–117)
BUN: 24 mg/dL — ABNORMAL HIGH (ref 6–23)
CO2: 28 mEq/L (ref 19–32)
Calcium: 9.4 mg/dL (ref 8.4–10.5)
Chloride: 98 mEq/L (ref 96–112)
Creatinine, Ser: 1.18 mg/dL (ref 0.40–1.50)
GFR: 65.9 mL/min (ref >60.00)
Glucose, Bld: 92 mg/dL (ref 70–99)
Potassium: 3.8 mEq/L (ref 3.5–5.1)
Sodium: 134 mEq/L — ABNORMAL LOW (ref 135–145)
Total Bilirubin: 1.6 mg/dL — ABNORMAL HIGH (ref 0.2–1.2)
Total Protein: 7.4 g/dL (ref 6.0–8.3)

## 2021-07-19 LAB — IBC + FERRITIN
Ferritin: 91.3 ng/mL (ref 22.0–322.0)
Iron: 118 ug/dL (ref 42–165)
Saturation Ratios: 40.3 % (ref 20.0–50.0)
TIBC: 292.6 ug/dL (ref 250.0–450.0)
Transferrin: 209 mg/dL — ABNORMAL LOW (ref 212.0–360.0)

## 2021-07-19 LAB — SEDIMENTATION RATE: Sed Rate: 18 mm/hr (ref 0–20)

## 2021-07-19 LAB — VITAMIN B12: Vitamin B-12: 470 pg/mL (ref 211–911)

## 2021-07-19 LAB — FOLATE: Folate: >24.2 ng/mL (ref >5.9)

## 2021-07-19 LAB — HIGH SENSITIVITY CRP: CRP, High Sensitivity: 0.57 mg/L (ref 0.000–5.000)

## 2021-07-19 NOTE — Progress Notes (Signed)
Jesse English    [id number]    1961/04/26  Primary Care Physician:Shaw, Emily Filbert., MD  Referring Physician: Ginger Organ., MD [address] [place],   [address]   Chief complaint: Crohn's disease  HPI:  61 year old very pleasant gentleman with history of Crohn's disease complicated by perianal abscess and fistula currently in clinical remission on Humira here for follow-up visit   He was previously on Entyvio, was switched to Humira as he was having difficulty with IV placement for infusions every 2 months.  He is tolerating Humira well, denies any reaction or skin rash   Overall he is doing well. Denies any nausea, vomiting, abdominal pain, change in bowel habits melena or bright red blood per rectum  He had intentionally lost weight last year but has gained back 10 pounds during this past holidays  Outpatient Encounter Medications as of 07/19/2021  Medication Sig   HUMIRA PEN 40 MG/0.4ML PNKT INJECT 40MG SUBCUTANEOUSLY  EVERY 2 WEEKS   hydrochlorothiazide (HYDRODIURIL) 25 MG tablet Take 25 mg by mouth daily.    rosuvastatin (CRESTOR) 10 MG tablet Take 10 mg by mouth daily.   No facility-administered encounter medications on file as of 07/19/2021.    Allergies as of 07/19/2021 - Review Complete 07/19/2021  Allergen Reaction Noted   Ace inhibitors Swelling and Other (See Comments) 09/23/2015    Past Medical History:  Diagnosis Date   Colon polyps    2013   Crohn's colitis ([place])    vs UC with abscess   Diverticulosis    Fournier's gangrene in male 04/2016   Gout    Hypertension    Perirectal abscess 04/2016   Prolapsed internal hemorrhoid, grade 4    Pulmonary embolism ([place])    Sleep apnea    cpap    Past Surgical History:  Procedure Laterality Date   ANAL FISSURE REPAIR     COLONOSCOPY     HEMORRHOID SURGERY  04/21/2016   Procedure: HEMORRHOIDECTOMY;  Surgeon: Michael Boston, MD;  [facility];  Service: General;;    INCISION AND DRAINAGE ABSCESS N/A 04/21/2016   Procedure: INCISION AND DRAINAGE OF HORSESHOE  FOURNIER GANGRENE PERIRECTAL ABSCESS;  Surgeon: Michael Boston, MD;  [facility];  Service: General;  Laterality: N/A;   LYMPHADENECTOMY Bilateral 03/18/2018   Procedure: Noel Journey, PELVIC;  Surgeon: Raynelle Bring, MD;  [facility]: [facility];  Service: Urology;  Laterality: Bilateral;   ROBOT ASSISTED LAPAROSCOPIC RADICAL PROSTATECTOMY N/A 03/18/2018   Procedure: XI ROBOTIC ASSISTED LAPAROSCOPIC RADICAL PROSTATECTOMY LEVEL 2; UMBILICAL HERNIA REPAIR;  Surgeon: Raynelle Bring, MD;  [facility];  Service: Urology;  Laterality: N/A;    Family History  Problem Relation Age of Onset   Pneumonia Mother    Pneumonia Father    Colon cancer Neg Hx    Esophageal cancer Neg Hx    Stomach cancer Neg Hx    Rectal cancer Neg Hx     Social History   Socioeconomic History   Marital status: Married    Spouse name: Not on file   Number of children: 2   Years of education: Not on file   Highest education level: Not on file  Occupational History   Occupation: sales  Tobacco Use   Smoking status: Former    Types: Cigars    Quit date: 07/10/2013    Years since quitting: 8.0   Smokeless tobacco: Never  Vaping Use   Vaping Use: Never used  Substance and Sexual Activity   Alcohol use: Not Currently    Comment: very little in the last 5 years as of 07/19/21   Drug use: No   Sexual activity: Not on file  Other Topics Concern   Not on file  Social History Narrative   Not on file   Social Determinants of Health   Financial Resource Strain: Not on file  Food Insecurity: Not on file  Transportation Needs: Not on file  Physical Activity: Not on file  Stress: Not on file  Social Connections: Not on file  Intimate Partner Violence: Not on file      Review of systems: All other review of systems negative except as mentioned in the HPI.   Physical Exam: Vitals:   07/19/21 0834  BP: 130/70   Pulse: 64   Body mass index is 30.71 kg/m. Gen:      No acute distress HEENT:  sclera anicteric Abd:      soft, non-tender; no palpable masses, no distension Ext:    No edema Neuro: alert and oriented x 3 Psych: normal mood and affect  Data Reviewed:  Reviewed labs, radiology imaging, old records and pertinent past GI work up   Assessment and Plan/Recommendations:  61 year old very pleasant gentleman with history of Crohn's colitis complicated by perianal abscess and fistula in 2017. C. difficile colitis in 2017 History of DVT and PE status post anticoagulation in 2017 for 6 months History of prostate carcinoma s/p radical prostatectomy September 2019   Crohn's disease: Clinical remission on maintenance therapy with Humira every 2 weeks, continue current regimen   IBD health maintenance: Due for TB QuantiFERON gold, CBC, CMP, ESR, CRP, iron panel, B12 and folate   He is up-to-date with vaccinations including influenza and COVID-19   Last Colonoscopy -  01/11/2017 with no active inflammation or dysplasia Recall colonoscopy due July 2025  Advised patient to avoid sugar and high carb diet to lose approximately 10 to 15 pounds and to aim for BMI below 30   Return in 1 year or sooner if needed  The patient was provided an opportunity to ask questions and all were answered. The patient agreed with the plan and demonstrated an understanding of the instructions.  Damaris Hippo , MD    CC: Ginger Organ., MD

## 2021-07-19 NOTE — Patient Instructions (Signed)
Your provider has requested that you go to the basement level for lab work before leaving today. Press "B" on the elevator. The lab is located at the first door on the left as you exit the elevator.   If you are age 63 or older, your body mass index should be between 23-30. Your Body mass index is 30.71 kg/m. If this is out of the aforementioned range listed, please consider follow up with your Primary Care Provider.  If you are age 71 or younger, your body mass index should be between 19-25. Your Body mass index is 30.71 kg/m. If this is out of the aformentioned range listed, please consider follow up with your Primary Care Provider.   ________________________________________________________  The [facility] providers would like to encourage you to use [facility] to communicate with providers for non-urgent requests or questions.  Due to long hold times on the telephone, sending your provider a message by [facility] may be a faster and more efficient way to get a response.  Please allow 48 business hours for a response.  Please remember that this is for non-urgent requests.  _______________________________________________________   Due to recent changes in healthcare laws, you may see the results of your imaging and laboratory studies on MyChart before your provider has had a chance to review them.  We understand that in some cases there may be results that are confusing or concerning to you. Not all laboratory results come back in the same time frame and the provider may be waiting for multiple results in order to interpret others.  Please give [place] 48 hours in order for your provider to thoroughly review all the results before contacting the office for clarification of your results.    I appreciate the  opportunity to care for you  Thank You   Harl Bowie , MD

## 2021-08-10 ENCOUNTER — Ambulatory Visit: Payer: Self-pay | Admitting: General Surgery

## 2021-08-10 NOTE — H&P (Signed)
History of Present Illness: Jesse English is a 61 y.o. male who is seen today as an office consultation at the request of Dr. Brigitte Pulse for evaluation of Hernia .   Patient is a 61 year old male, comes in secondary to a right inguinal hernia.  He states this been there since September.  He states he was having a coughing bout and felt the area pop.  He states he been able to reduce the hernia on his own.  He said no signs or symptoms of incarceration or strangulation.   Patient is active and goes to the gym 5 days a week.   He has had a previous robotic prostatectomy by Dr. Alinda Money approximate 3 years ago.         Review of Systems: A complete review of systems was obtained from the patient.  I have reviewed this information and discussed as appropriate with the patient.  See HPI as well for other ROS.   Review of Systems  Constitutional: Negative for fever.  HENT: Negative for congestion.   Eyes: Negative for blurred vision.  Respiratory: Negative for cough, shortness of breath and wheezing.   Cardiovascular: Negative for chest pain and palpitations.  Gastrointestinal: Negative for heartburn.  Genitourinary: Negative for dysuria.  Musculoskeletal: Negative for myalgias.  Skin: Negative for rash.  Neurological: Negative for dizziness and headaches.  Psychiatric/Behavioral: Negative for depression and suicidal ideas.  All other systems reviewed and are negative.       Medical History: Past Medical History Past Medical History: Diagnosis Date  Gout     attack recent allopurinol and colchicine   OSA (obstructive sleep apnea)        Patient Active Problem List Diagnosis  Crohn's disease (CMS-HCC)  Crohn's colitis, other complication (CMS-HCC)  Rectal fistula  Peri-rectal abscess  DVT (deep venous thrombosis) (CMS-HCC)  Pulmonary embolus (CMS-HCC)  Colitis, indeterminate  Rash, skin  History of Clostridium difficile infection, recurrent  Stasis dermatitis of both  legs  Obesity, Class I, BMI 30-34.9, unspecified  OSA (obstructive sleep apnea)  Hypertension, essential  HLD (hyperlipidemia)     Past Surgical History Past Surgical History: Procedure Laterality Date  COLONOSCOPY   2013   Dr. Doren Custard Diverticulosis, sigmoid erythema  COLONOSCOPY   09/2015   Dr. Sheppard Penton       Allergies Allergies Allergen Reactions  Ace Inhibitors Swelling     Facial swelling and small intestinal swelling.      Current Outpatient Medications on File Prior to Visit Medication Sig Dispense Refill  amLODIPine (NORVASC) 5 MG tablet Take 5 mg by mouth once daily.      mercaptopurine (PURINETHOL) 50 mg tablet TAKE 1 TABLET BY MOUTH DAILY ON AN EMPTY STOMACH, 1 HOUR BEFORE OR 2 HOURS AFTER A MEAL, CAUTION: CHEMOTHERAPY      tinidazole (TINDAMAX) 500 MG tablet Take by mouth.       No current facility-administered medications on file prior to visit.     Family History Family History Problem Relation Age of Onset  Dementia Mother    Coronary Artery Disease (Blocked arteries around heart) Father        Social History   Tobacco Use Smoking Status Former  Types: Cigarettes  Quit date: 04/2013  Years since quitting: 8.3 Smokeless Tobacco Never     Social History Social History    Socioeconomic History  Marital status: Married  Number of children: 2  Years of education: 63 Occupational History     Comment: Transport planner  Tobacco Use  Smoking status: Former     Types: Cigarettes     Quit date: 04/2013     Years since quitting: 8.3  Smokeless tobacco: Never Substance and Sexual Activity  Alcohol use: Yes     Comment: 1 -2 /DAY  Sexual activity: Defer Social History Narrative   Transport planner, [employer] graduate       Objective:     Vitals:   08/10/21 1009 BP: 138/82 Pulse: 72 Temp: 36.8 C (98.3 F) SpO2: 98% Weight: 99.5 kg (219 lb 6.4 oz) Height: 176.6 cm (5' 9.54")   Body mass index is 31.9 kg/m. Physical  Exam Constitutional:      Appearance: Normal appearance.  HENT:     Head: Normocephalic and atraumatic.     Nose: Nose normal. No congestion.     Mouth/Throat:     Mouth: Mucous membranes are moist.     Pharynx: Oropharynx is clear.  Eyes:     Pupils: Pupils are equal, round, and reactive to light.  Cardiovascular:     Rate and Rhythm: Normal rate and regular rhythm.     Pulses: Normal pulses.     Heart sounds: Normal heart sounds. No murmur heard.   No friction rub. No gallop.  Pulmonary:     Effort: Pulmonary effort is normal. No respiratory distress.     Breath sounds: Normal breath sounds. No stridor. No wheezing, rhonchi or rales.  Abdominal:     General: Abdomen is flat.     Hernia: A hernia is present. Hernia is present in the right inguinal area.  Musculoskeletal:        General: Normal range of motion.     Cervical back: Normal range of motion.  Skin:    General: Skin is warm and dry.  Neurological:     General: No focal deficit present.     Mental Status: He is alert and oriented to person, place, and time.  Psychiatric:        Mood and Affect: Mood normal.        Thought Content: Thought content normal.          Assessment and Plan: Diagnoses and all orders for this visit:   Unilateral inguinal hernia without obstruction or gangrene, recurrence not specified     Jesse English is a 61 y.o. male    1.  We will proceed to the OR for a lap inguinal hernia repair with mesh. 2. All risks and benefits were discussed with the patient, to generally include infection, bleeding, damage to surrounding structures, acute and chronic nerve pain, and recurrence. Alternatives were offered and described.  All questions were answered and the patient voiced understanding of the procedure and wishes to proceed at this point.

## 2021-09-12 NOTE — Pre-Procedure Instructions (Signed)
Surgical Instructions ? ? ? Your procedure is scheduled on Friday, March 10th. ? Report to [facility] Main Entrance "A" at 10:00 A.M., then check in with the Admitting office. ? Call this number if you have problems the morning of surgery: ? [phone] ? ? If you have any questions prior to your surgery date call [phone]: Open Monday-Friday 8am-4pm ? ? ? Remember: ? Do not eat after midnight the night before your surgery ? ?You may drink clear liquids until 09:00 AM the morning of your surgery.   ?Clear liquids allowed are: Water, Non-Citrus Juices (without pulp), Carbonated Beverages, Clear Tea, Black Coffee Only (NO MILK, CREAM OR POWDERED CREAMER of any kind), and Gatorade. ? ? ?Patient Instructions ? ?The night before surgery:  ?No food after midnight. ONLY clear liquids after midnight ? ?The day of surgery (if you do NOT have diabetes):  ?Drink ONE (1) Pre-Surgery Clear Ensure by 09:00 AM the morning of surgery. Drink in one sitting. Do not sip.  ?This drink was given to you during your hospital  ?pre-op appointment visit. ? ?Nothing else to drink after completing the  ?Pre-Surgery Clear Ensure. ? ? ?       If you have questions, please contact your surgeon?s office.  ? ?  ? Take these medicines the morning of surgery with A SIP OF WATER  ?rosuvastatin (CRESTOR)  ? ?Hold HUMIRA for 3 days prior to surgery. ? ?As of today, STOP taking any Aspirin (unless otherwise instructed by your surgeon) Aleve, Naproxen, Ibuprofen, Motrin, Advil, Goody's, BC's, all herbal medications, fish oil, and all vitamins. ?         ?           ?Do NOT Smoke (Tobacco/Vaping) for 24 hours prior to your procedure. ? ?If you use a CPAP at night, you may bring your mask/headgear for your overnight stay. ?  ?Contacts, glasses, piercing's, hearing aid's, dentures or partials may not be worn into surgery, please bring cases for these belongings.  ?  ?For patients admitted to the hospital, discharge time will be determined by your  treatment team. ?  ?Patients discharged the day of surgery will not be allowed to drive home, and someone needs to stay with them for 24 hours. ? ?NO VISITORS WILL BE ALLOWED IN PRE-OP WHERE PATIENTS ARE PREPPED FOR SURGERY.  ONLY 1 SUPPORT PERSON MAY BE PRESENT IN THE WAITING ROOM WHILE YOU ARE IN SURGERY.  IF YOU ARE TO BE ADMITTED, ONCE YOU ARE IN YOUR ROOM YOU WILL BE ALLOWED TWO (2) VISITORS. (1) VISITOR MAY STAY OVERNIGHT BUT MUST ARRIVE TO THE ROOM BY 8pm.  Minor children may have two parents present. Special consideration for safety and communication needs will be reviewed on a case by case basis. ? ? ?Special instructions:   ?[facility]- Preparing For Surgery ? ?Before surgery, you can play an important role. Because skin is not sterile, your skin needs to be as free of germs as possible. You can reduce the number of germs on your skin by washing with CHG (chlorahexidine gluconate) Soap before surgery.  CHG is an antiseptic cleaner which kills germs and bonds with the skin to continue killing germs even after washing.   ? ?Oral Hygiene is also important to reduce your risk of infection.  Remember - BRUSH YOUR TEETH THE MORNING OF SURGERY WITH YOUR REGULAR TOOTHPASTE ? ?Please do not use if you have an allergy to CHG or antibacterial soaps. If your skin becomes reddened/irritated  stop using the CHG.  ?Do not shave (including legs and underarms) for at least 48 hours prior to first CHG shower. It is OK to shave your face. ? ?Please follow these instructions carefully. ?  ?Shower the NIGHT BEFORE SURGERY and the MORNING OF SURGERY ? ?If you chose to wash your hair, wash your hair first as usual with your normal shampoo. ? ?After you shampoo, rinse your hair and body thoroughly to remove the shampoo. ? ?Use CHG Soap as you would any other liquid soap. You can apply CHG directly to the skin and wash gently with a scrungie or a clean washcloth.  ? ?Apply the CHG Soap to your body ONLY FROM THE NECK DOWN.  Do not  use on open wounds or open sores. Avoid contact with your eyes, ears, mouth and genitals (private parts). Wash Face and genitals (private parts)  with your normal soap.  ? ?Wash thoroughly, paying special attention to the area where your surgery will be performed. ? ?Thoroughly rinse your body with warm water from the neck down. ? ?DO NOT shower/wash with your normal soap after using and rinsing off the CHG Soap. ? ?Pat yourself dry with a CLEAN TOWEL. ? ?Wear CLEAN PAJAMAS to bed the night before surgery ? ?Place CLEAN SHEETS on your bed the night before your surgery ? ?DO NOT SLEEP WITH PETS. ? ? ?Day of Surgery: ?Take a shower with CHG soap. ?Do not wear jewelry  ?Do not wear lotions, powders, colognes, or deodorant. ?Do not shave 48 hours prior to surgery.  Men may shave face and neck. ?Do not bring valuables to the hospital. [facility] is not responsible for any belongings or valuables. ?Wear Clean/Comfortable clothing the morning of surgery ?Do not apply any deodorants/lotions.   ?Remember to brush your teeth WITH YOUR REGULAR TOOTHPASTE. ?  ?Please read over the following fact sheets that you were given. ? ? ?3 days prior to your procedure or After your COVID test ? ?? You are not required to quarantine however you are required to wear a well-fitting mask when you are out and around people not in your household. If your mask becomes wet or soiled, replace with a new one. ? ?? Wash your hands often with soap and water for 20 seconds or clean your hands with an alcohol-based hand sanitizer that contains at least 60% alcohol. ? ?? Do not share personal items. ? ?? Notify your provider: ? ?o if you are in close contact with someone who has COVID ? ?o or if you develop a fever of 100.4 or greater, sneezing, cough, sore throat, shortness of breath or body aches.   ?

## 2021-09-13 ENCOUNTER — Other Ambulatory Visit: Payer: Self-pay

## 2021-09-13 ENCOUNTER — Encounter (HOSPITAL_COMMUNITY): Payer: Self-pay

## 2021-09-13 ENCOUNTER — Encounter (HOSPITAL_COMMUNITY)
Admission: RE | Admit: 2021-09-13 | Discharge: 2021-09-13 | Disposition: A | Discharge status: Home / Self Care | Payer: 59 | Source: Ambulatory Visit | Attending: General Surgery | Admitting: General Surgery

## 2021-09-13 VITALS — BP 128/72 | HR 59 | Temp 98.3°F | Resp 17 | Ht 70.5 in | Wt 213.4 lb

## 2021-09-13 DIAGNOSIS — Z01818 Encounter for other preprocedural examination: Secondary | ICD-10-CM | POA: Insufficient documentation

## 2021-09-13 DIAGNOSIS — I10 Essential (primary) hypertension: Secondary | ICD-10-CM | POA: Insufficient documentation

## 2021-09-13 LAB — CBC
HCT: 41.8 % (ref 39.0–52.0)
Hemoglobin: 13.8 g/dL (ref 13.0–17.0)
MCH: 30.2 pg (ref 26.0–34.0)
MCHC: 33 g/dL (ref 30.0–36.0)
MCV: 91.5 fL (ref 80.0–100.0)
Platelets: 166 10*3/uL (ref 150–400)
RBC: 4.57 MIL/uL (ref 4.22–5.81)
RDW: 13.3 % (ref 11.5–15.5)
WBC: 6 10*3/uL (ref 4.0–10.5)
nRBC: 0 % (ref 0.0–0.2)

## 2021-09-13 LAB — BASIC METABOLIC PANEL
Anion gap: 6 (ref 5–15)
BUN: 24 mg/dL — ABNORMAL HIGH (ref 8–23)
CO2: 27 mmol/L (ref 22–32)
Calcium: 9.3 mg/dL (ref 8.9–10.3)
Chloride: 104 mmol/L (ref 98–111)
Creatinine, Ser: 1.33 mg/dL — ABNORMAL HIGH (ref 0.61–1.24)
GFR, Estimated: >60 mL/min (ref >60)
Glucose, Bld: 92 mg/dL (ref 70–99)
Potassium: 3.7 mmol/L (ref 3.5–5.1)
Sodium: 137 mmol/L (ref 135–145)

## 2021-09-13 NOTE — Progress Notes (Signed)
PCP - Dr. Marton Redwood ?Cardiologist - denies ? ?PPM/ICD - denies ? ? ?Chest x-ray - 05/20/16 ?EKG - 03/14/18 ?Stress Test - denies ?ECHO - 08/30/16 ?Cardiac Cath - denies ? ?Sleep Study - 06/19/18 OSA+ ?CPAP - yes ? ?DM- denies ? ?ASA/ Blood Thinner Instructions: n/a ? ? ?ERAS Protcol - yes ?PRE-SURGERY Ensure given at PAT ? ?COVID TEST- n/a, ambulatory surgery ? ? ?Anesthesia review: no ? ?Patient denies shortness of breath, fever, cough and chest pain at PAT appointment ? ? ?All instructions explained to the patient, with a verbal understanding of the material. Patient agrees to go over the instructions while at home for a better understanding. Patient also instructed to wear a mask in public for 3 days prior to surgery. The opportunity to ask questions was provided. ?  ?

## 2021-09-16 ENCOUNTER — Encounter (HOSPITAL_COMMUNITY)
Admission: RE | Disposition: A | Discharge status: Home / Self Care | Payer: Self-pay | Source: Ambulatory Visit | Attending: General Surgery

## 2021-09-16 ENCOUNTER — Ambulatory Visit (HOSPITAL_BASED_OUTPATIENT_CLINIC_OR_DEPARTMENT_OTHER): Payer: 59 | Admitting: Anesthesiology

## 2021-09-16 ENCOUNTER — Ambulatory Visit (HOSPITAL_COMMUNITY): Payer: 59 | Admitting: Anesthesiology

## 2021-09-16 ENCOUNTER — Encounter (HOSPITAL_COMMUNITY): Payer: Self-pay | Admitting: General Surgery

## 2021-09-16 ENCOUNTER — Ambulatory Visit (HOSPITAL_COMMUNITY)
Admission: RE | Admit: 2021-09-16 | Discharge: 2021-09-16 | Disposition: A | Discharge status: Home / Self Care | Payer: 59 | Source: Ambulatory Visit | Attending: General Surgery | Admitting: General Surgery

## 2021-09-16 ENCOUNTER — Other Ambulatory Visit: Payer: Self-pay

## 2021-09-16 DIAGNOSIS — K429 Umbilical hernia without obstruction or gangrene: Secondary | ICD-10-CM | POA: Insufficient documentation

## 2021-09-16 DIAGNOSIS — K5190 Ulcerative colitis, unspecified, without complications: Secondary | ICD-10-CM | POA: Insufficient documentation

## 2021-09-16 DIAGNOSIS — I10 Essential (primary) hypertension: Secondary | ICD-10-CM | POA: Insufficient documentation

## 2021-09-16 DIAGNOSIS — K4090 Unilateral inguinal hernia, without obstruction or gangrene, not specified as recurrent: Secondary | ICD-10-CM

## 2021-09-16 DIAGNOSIS — Z87891 Personal history of nicotine dependence: Secondary | ICD-10-CM | POA: Insufficient documentation

## 2021-09-16 DIAGNOSIS — Z9079 Acquired absence of other genital organ(s): Secondary | ICD-10-CM | POA: Insufficient documentation

## 2021-09-16 HISTORY — PX: UMBILICAL HERNIA REPAIR: SHX196

## 2021-09-16 HISTORY — PX: INSERTION OF MESH: SHX5868

## 2021-09-16 HISTORY — PX: INGUINAL HERNIA REPAIR: SHX194

## 2021-09-16 SURGERY — REPAIR, HERNIA, INGUINAL, LAPAROSCOPIC
Anesthesia: General | Site: Inguinal | Laterality: Right

## 2021-09-16 MED ORDER — ORAL CARE MOUTH RINSE: 15.0000 mL | Freq: Once | OROMUCOSAL | Status: AC

## 2021-09-16 MED ORDER — PHENYLEPHRINE 40 MCG/ML (10ML) SYRINGE FOR IV PUSH (FOR BLOOD PRESSURE SUPPORT)
PREFILLED_SYRINGE | INTRAVENOUS | Status: AC
  Filled 2021-09-16: qty 20

## 2021-09-16 MED ORDER — FENTANYL CITRATE (PF) 100 MCG/2ML IJ SOLN: 25.0000 ug | INTRAMUSCULAR | Status: DC | PRN

## 2021-09-16 MED ORDER — BUPIVACAINE-EPINEPHRINE (PF) 0.25% -1:200000 IJ SOLN
INTRAMUSCULAR | Status: AC
  Filled 2021-09-16: qty 30

## 2021-09-16 MED ORDER — DEXAMETHASONE SODIUM PHOSPHATE 10 MG/ML IJ SOLN
INTRAMUSCULAR | Status: AC
  Filled 2021-09-16: qty 3

## 2021-09-16 MED ORDER — CHLORHEXIDINE GLUCONATE CLOTH 2 % EX PADS: 6.0000 | MEDICATED_PAD | Freq: Once | CUTANEOUS | Status: DC

## 2021-09-16 MED ORDER — FENTANYL CITRATE (PF) 250 MCG/5ML IJ SOLN
INTRAMUSCULAR | Status: DC | PRN
  Administered 2021-09-16: 100 ug via INTRAVENOUS
  Administered 2021-09-16: 25 ug via INTRAVENOUS

## 2021-09-16 MED ORDER — 0.9 % SODIUM CHLORIDE (POUR BTL) OPTIME
TOPICAL | Status: DC | PRN
  Administered 2021-09-16: 1000 mL

## 2021-09-16 MED ORDER — TRAMADOL HCL 50 MG PO TABS
50.0000 mg | ORAL_TABLET | Freq: Four times a day (QID) | ORAL | 0 refills | Status: DC | PRN
Start: 2021-09-16 — End: 2022-07-26

## 2021-09-16 MED ORDER — BUPIVACAINE HCL (PF) 0.25 % IJ SOLN
INTRAMUSCULAR | Status: AC
  Filled 2021-09-16: qty 30

## 2021-09-16 MED ORDER — DEXMEDETOMIDINE (PRECEDEX) IN NS 20 MCG/5ML (4 MCG/ML) IV SYRINGE
PREFILLED_SYRINGE | INTRAVENOUS | Status: DC | PRN
Start: 2021-09-16 — End: 2021-09-16
  Administered 2021-09-16: 8 ug via INTRAVENOUS

## 2021-09-16 MED ORDER — ROCURONIUM BROMIDE 10 MG/ML (PF) SYRINGE
PREFILLED_SYRINGE | INTRAVENOUS | Status: DC | PRN
  Administered 2021-09-16: 60 mg via INTRAVENOUS

## 2021-09-16 MED ORDER — CHLORHEXIDINE GLUCONATE 0.12 % MT SOLN
15.0000 mL | Freq: Once | OROMUCOSAL | Status: AC
  Administered 2021-09-16: 15 mL via OROMUCOSAL
  Filled 2021-09-16: qty 15

## 2021-09-16 MED ORDER — MIDAZOLAM HCL 2 MG/2ML IJ SOLN
INTRAMUSCULAR | Status: DC | PRN
Start: 2021-09-16 — End: 2021-09-16
  Administered 2021-09-16: 2 mg via INTRAVENOUS

## 2021-09-16 MED ORDER — OXYCODONE HCL 5 MG/5ML PO SOLN: 5.0000 mg | Freq: Once | ORAL | Status: DC | PRN

## 2021-09-16 MED ORDER — DEXAMETHASONE SODIUM PHOSPHATE 10 MG/ML IJ SOLN
INTRAMUSCULAR | Status: DC | PRN
  Administered 2021-09-16: 10 mg via INTRAVENOUS

## 2021-09-16 MED ORDER — ONDANSETRON HCL 4 MG/2ML IJ SOLN
INTRAMUSCULAR | Status: DC | PRN
  Administered 2021-09-16: 4 mg via INTRAVENOUS

## 2021-09-16 MED ORDER — ROCURONIUM BROMIDE 10 MG/ML (PF) SYRINGE
PREFILLED_SYRINGE | INTRAVENOUS | Status: AC
  Filled 2021-09-16: qty 30

## 2021-09-16 MED ORDER — PHENYLEPHRINE HCL-NACL 20-0.9 MG/250ML-% IV SOLN
INTRAVENOUS | Status: DC | PRN
  Administered 2021-09-16: 20 ug/min via INTRAVENOUS

## 2021-09-16 MED ORDER — ENSURE PRE-SURGERY PO LIQD: 296.0000 mL | Freq: Once | ORAL | Status: DC

## 2021-09-16 MED ORDER — LACTATED RINGERS IV SOLN
INTRAVENOUS | Status: DC
Start: 2021-09-16 — End: 2021-09-16

## 2021-09-16 MED ORDER — PROPOFOL 10 MG/ML IV BOLUS
INTRAVENOUS | Status: DC | PRN
  Administered 2021-09-16: 200 mg via INTRAVENOUS

## 2021-09-16 MED ORDER — OXYCODONE HCL 5 MG PO TABS: 5.0000 mg | ORAL_TABLET | Freq: Once | ORAL | Status: DC | PRN

## 2021-09-16 MED ORDER — FENTANYL CITRATE (PF) 250 MCG/5ML IJ SOLN
INTRAMUSCULAR | Status: AC
  Filled 2021-09-16: qty 5

## 2021-09-16 MED ORDER — BUPIVACAINE HCL 0.25 % IJ SOLN
INTRAMUSCULAR | Status: DC | PRN
  Administered 2021-09-16: 6 mL

## 2021-09-16 MED ORDER — ONDANSETRON HCL 4 MG/2ML IJ SOLN
INTRAMUSCULAR | Status: AC
  Filled 2021-09-16: qty 8

## 2021-09-16 MED ORDER — GLYCOPYRROLATE 0.2 MG/ML IJ SOLN
INTRAMUSCULAR | Status: DC | PRN
  Administered 2021-09-16: .2 mg via INTRAVENOUS

## 2021-09-16 MED ORDER — SUGAMMADEX SODIUM 200 MG/2ML IV SOLN
INTRAVENOUS | Status: DC | PRN
  Administered 2021-09-16: 400 mg via INTRAVENOUS

## 2021-09-16 MED ORDER — SODIUM CHLORIDE 0.9 % IR SOLN
Status: DC | PRN
  Administered 2021-09-16: 1000 mL

## 2021-09-16 MED ORDER — CEFAZOLIN SODIUM-DEXTROSE 2-4 GM/100ML-% IV SOLN
2.0000 g | INTRAVENOUS | Status: AC
  Administered 2021-09-16: 2 g via INTRAVENOUS
  Filled 2021-09-16: qty 100

## 2021-09-16 MED ORDER — PHENYLEPHRINE 40 MCG/ML (10ML) SYRINGE FOR IV PUSH (FOR BLOOD PRESSURE SUPPORT)
PREFILLED_SYRINGE | INTRAVENOUS | Status: DC | PRN
  Administered 2021-09-16 (×2): 80 ug via INTRAVENOUS
  Administered 2021-09-16: 120 ug via INTRAVENOUS
  Administered 2021-09-16 (×2): 80 ug via INTRAVENOUS

## 2021-09-16 MED ORDER — ONDANSETRON HCL 4 MG/2ML IJ SOLN: 4.0000 mg | Freq: Four times a day (QID) | INTRAMUSCULAR | Status: DC | PRN

## 2021-09-16 MED ORDER — LIDOCAINE 2% (20 MG/ML) 5 ML SYRINGE
INTRAMUSCULAR | Status: DC | PRN
  Administered 2021-09-16: 50 mg via INTRAVENOUS

## 2021-09-16 MED ORDER — PROPOFOL 10 MG/ML IV BOLUS
INTRAVENOUS | Status: AC
  Filled 2021-09-16: qty 20

## 2021-09-16 MED ORDER — ACETAMINOPHEN 500 MG PO TABS
1000.0000 mg | ORAL_TABLET | ORAL | Status: AC
  Administered 2021-09-16: 1000 mg via ORAL
  Filled 2021-09-16: qty 2

## 2021-09-16 MED ORDER — MIDAZOLAM HCL 2 MG/2ML IJ SOLN
INTRAMUSCULAR | Status: AC
  Filled 2021-09-16: qty 2

## 2021-09-16 SURGICAL SUPPLY — 67 items
ADH SKN CLS APL DERMABOND .7 (GAUZE/BANDAGES/DRESSINGS) ×2
APL PRP STRL LF DISP 70% ISPRP (MISCELLANEOUS) ×2
BAG COUNTER SPONGE SURGICOUNT (BAG) ×4 IMPLANT
BAG SPNG CNTER NS LX DISP (BAG) ×2
BAG SURGICOUNT SPONGE COUNTING (BAG) ×1
BLADE CLIPPER SURG (BLADE) ×2 IMPLANT
CANISTER SUCT 3000ML PPV (MISCELLANEOUS) ×5 IMPLANT
CHLORAPREP W/TINT 26 (MISCELLANEOUS) ×5 IMPLANT
COVER SURGICAL LIGHT HANDLE (MISCELLANEOUS) ×5 IMPLANT
DERMABOND ADVANCED (GAUZE/BANDAGES/DRESSINGS) ×2
DERMABOND ADVANCED .7 DNX12 (GAUZE/BANDAGES/DRESSINGS) ×3 IMPLANT
DISSECTOR BLUNT TIP ENDO 5MM (MISCELLANEOUS) IMPLANT
DRAPE LAPAROTOMY 100X72 PEDS (DRAPES) ×5 IMPLANT
ELECT REM PT RETURN 9FT ADLT (ELECTROSURGICAL) ×4
ELECTRODE REM PT RTRN 9FT ADLT (ELECTROSURGICAL) ×3 IMPLANT
ENDOLOOP SUT PDS II  0 18 (SUTURE) ×4
ENDOLOOP SUT PDS II 0 18 (SUTURE) IMPLANT
GAUZE 4X4 16PLY ~~LOC~~+RFID DBL (SPONGE) ×5 IMPLANT
GLOVE SRG 8 PF TXTR STRL LF DI (GLOVE) ×3 IMPLANT
GLOVE SURG SYN 7.5  E (GLOVE) ×4
GLOVE SURG SYN 7.5 E (GLOVE) ×2 IMPLANT
GLOVE SURG SYN 7.5 PF PI (GLOVE) ×3 IMPLANT
GLOVE SURG UNDER POLY LF SZ8 (GLOVE) ×4
GOWN STRL REUS W/ TWL LRG LVL3 (GOWN DISPOSABLE) ×6 IMPLANT
GOWN STRL REUS W/ TWL XL LVL3 (GOWN DISPOSABLE) ×3 IMPLANT
GOWN STRL REUS W/TWL LRG LVL3 (GOWN DISPOSABLE) ×8
GOWN STRL REUS W/TWL XL LVL3 (GOWN DISPOSABLE) ×4
KIT BASIN OR (CUSTOM PROCEDURE TRAY) ×5 IMPLANT
KIT TURNOVER KIT B (KITS) ×5 IMPLANT
MESH 3DMAX 5X7 RT XLRG (Mesh General) ×2 IMPLANT
NDL HYPO 25GX1X1/2 BEV (NEEDLE) ×3 IMPLANT
NDL INSUFFLATION 14GA 120MM (NEEDLE) IMPLANT
NEEDLE HYPO 25GX1X1/2 BEV (NEEDLE) ×4 IMPLANT
NEEDLE INSUFFLATION 14GA 120MM (NEEDLE) ×4 IMPLANT
NS IRRIG 1000ML POUR BTL (IV SOLUTION) ×5 IMPLANT
PACK GENERAL/GYN (CUSTOM PROCEDURE TRAY) ×5 IMPLANT
PAD ARMBOARD 7.5X6 YLW CONV (MISCELLANEOUS) ×10 IMPLANT
PENCIL SMOKE EVACUATOR (MISCELLANEOUS) ×5 IMPLANT
RELOAD STAPLE 4.0 BLU F/HERNIA (INSTRUMENTS) IMPLANT
RELOAD STAPLE 4.8 BLK F/HERNIA (STAPLE) IMPLANT
RELOAD STAPLE HERNIA 4.0 BLUE (INSTRUMENTS) ×4 IMPLANT
RELOAD STAPLE HERNIA 4.8 BLK (STAPLE) IMPLANT
SCISSORS LAP 5X35 DISP (ENDOMECHANICALS) ×5 IMPLANT
SET IRRIG TUBING LAPAROSCOPIC (IRRIGATION / IRRIGATOR) IMPLANT
SET TUBE SMOKE EVAC HIGH FLOW (TUBING) ×5 IMPLANT
STAPLER HERNIA 12 8.5 360D (INSTRUMENTS) IMPLANT
SUT ETHIBOND 0 MO6 C/R (SUTURE) ×2 IMPLANT
SUT MNCRL AB 4-0 PS2 18 (SUTURE) ×5 IMPLANT
SUT NOVA NAB GS-21 0 18 T12 DT (SUTURE) ×2 IMPLANT
SUT PROLENE 0 CT 1 30 (SUTURE) ×6 IMPLANT
SUT VIC AB 1 CT1 27 (SUTURE)
SUT VIC AB 1 CT1 27XBRD ANBCTR (SUTURE) IMPLANT
SUT VIC AB 2-0 CT1 27 (SUTURE)
SUT VIC AB 2-0 CT1 TAPERPNT 27 (SUTURE) ×3 IMPLANT
SUT VIC AB 3-0 SH 27 (SUTURE) ×4
SUT VIC AB 3-0 SH 27XBRD (SUTURE) ×3 IMPLANT
SYR CONTROL 10ML LL (SYRINGE) ×5 IMPLANT
SYRINGE TOOMEY DISP (SYRINGE) ×5 IMPLANT
TOWEL GREEN STERILE (TOWEL DISPOSABLE) ×5 IMPLANT
TOWEL GREEN STERILE FF (TOWEL DISPOSABLE) ×5 IMPLANT
TRAY FOLEY W/BAG SLVR 14FR (SET/KITS/TRAYS/PACK) ×5 IMPLANT
TRAY LAPAROSCOPIC MC (CUSTOM PROCEDURE TRAY) ×5 IMPLANT
TROCAR OPTICAL SHORT 5MM (TROCAR) ×5 IMPLANT
TROCAR OPTICAL SLV SHORT 5MM (TROCAR) ×5 IMPLANT
TROCAR XCEL 12X100 BLDLESS (ENDOMECHANICALS) ×5 IMPLANT
WARMER LAPAROSCOPE (MISCELLANEOUS) ×5 IMPLANT
WATER STERILE IRR 1000ML POUR (IV SOLUTION) ×5 IMPLANT

## 2021-09-16 NOTE — Anesthesia Preprocedure Evaluation (Signed)
Anesthesia Evaluation  ?Patient identified by MRN, date of birth, ID band ?Patient awake ? ? ? ?Reviewed: ?Allergy & Precautions, H&P , NPO status , Patient's Chart, lab work & pertinent test results ? ?Airway ?Mallampati: II ? ? ?Neck ROM: full ? ? ? Dental ?  ?Pulmonary ?sleep apnea , former smoker,  ?  ?breath sounds clear to auscultation ? ? ? ? ? ? Cardiovascular ?hypertension,  ?Rhythm:regular Rate:Normal ? ? ?  ?Neuro/Psych ?  ? GI/Hepatic ?Ulcerative colitis ?  ?Endo/Other  ? ? Renal/GU ?  ? ?  ?Musculoskeletal ? ? Abdominal ?  ?Peds ? Hematology ?  ?Anesthesia Other Findings ? ? Reproductive/Obstetrics ? ?  ? ? ? ? ? ? ? ? ? ? ? ? ? ?  ?  ? ? ? ? ? ? ? ? ?Anesthesia Physical ?Anesthesia Plan ? ?ASA: 2 ? ?Anesthesia Plan: General  ? ?Post-op Pain Management:   ? ?Induction: Intravenous ? ?PONV Risk Score and Plan: 2 and Ondansetron, Dexamethasone, Midazolam and Treatment may vary due to age or medical condition ? ?Airway Management Planned: Oral ETT ? ?Additional Equipment:  ? ?Intra-op Plan:  ? ?Post-operative Plan: Extubation in OR ? ?Informed Consent: I have reviewed the patients History and Physical, chart, labs and discussed the procedure including the risks, benefits and alternatives for the proposed anesthesia with the patient or authorized representative who has indicated his/her understanding and acceptance.  ? ? ? ?Dental advisory given ? ?Plan Discussed with: CRNA, Anesthesiologist and Surgeon ? ?Anesthesia Plan Comments:   ? ? ? ? ? ? ?Anesthesia Quick Evaluation ? ?

## 2021-09-16 NOTE — H&P (Signed)
History of Present Illness: ?Jesse English is a 61 y.o. male who is seen today as an office consultation at the request of Dr. Brigitte Pulse for evaluation of Hernia ?Marland Kitchen   ?Patient is a 61 year old male, comes in secondary to a right inguinal hernia.  He states this been there since September.  He states he was having a coughing bout and felt the area pop.  He states he been able to reduce the hernia on his own.  He said no signs or symptoms of incarceration or strangulation. ?  ?Patient is active and goes to the gym 5 days a week. ?  ?He has had a previous robotic prostatectomy by Dr. Alinda Money approximate 3 years ago. ?  ?  ?  ?  ?Review of Systems: ?A complete review of systems was obtained from the patient.  I have reviewed this information and discussed as appropriate with the patient.  See HPI as well for other ROS. ?  ?Review of Systems  ?Constitutional: Negative for fever.  ?HENT: Negative for congestion.   ?Eyes: Negative for blurred vision.  ?Respiratory: Negative for cough, shortness of breath and wheezing.   ?Cardiovascular: Negative for chest pain and palpitations.  ?Gastrointestinal: Negative for heartburn.  ?Genitourinary: Negative for dysuria.  ?Musculoskeletal: Negative for myalgias.  ?Skin: Negative for rash.  ?Neurological: Negative for dizziness and headaches.  ?Psychiatric/Behavioral: Negative for depression and suicidal ideas.  ?All other systems reviewed and are negative. ?  ?  ?  ?Medical History: ?Past Medical History ?Past Medical History: ?Diagnosis        Date ?           Gout      ?             attack recent allopurinol and colchicine  ?           OSA (obstructive sleep apnea)            ?  ?  ?  ?Patient Active Problem List ?Diagnosis ?           Crohn's disease (CMS-HCC) ?           Crohn's colitis, other complication (CMS-HCC) ?           Rectal fistula ?           Peri-rectal abscess ?           DVT (deep venous thrombosis) (CMS-HCC) ?           Pulmonary embolus (CMS-HCC) ?            Colitis, indeterminate ?           Rash, skin ?           History of Clostridium difficile infection, recurrent ?           Stasis dermatitis of both legs ?           Obesity, Class I, BMI 30-34.9, unspecified ?           OSA (obstructive sleep apnea) ?           Hypertension, essential ?           HLD (hyperlipidemia) ?  ?  ?Past Surgical History ?Past Surgical History: ?Procedure       Laterality         Date ?           COLONOSCOPY  2013 ?             Dr. Maurene Capes, [place] Shape Diverticulosis, sigmoid erythema ?           COLONOSCOPY                     09/2015 ?             Dr. Sheppard Penton  ?  ?  ?  ?Allergies ?Allergies ?Allergen           Reactions ?           Ace Inhibitors  Swelling ?                          Facial swelling and small intestinal swelling. ?  ?  ?  ?Current Outpatient Medications on File Prior to Visit ?Medication       Sig       Dispense         Refill ?           amLODIPine (NORVASC) 5 MG tablet          Take 5 mg by mouth once daily.                       ?           mercaptopurine (PURINETHOL) 50 mg tablet           TAKE 1 TABLET BY MOUTH DAILY ON AN EMPTY STOMACH, 1 HOUR BEFORE OR 2 HOURS AFTER A MEAL, CAUTION: CHEMOTHERAPY                           ?           tinidazole (TINDAMAX) 500 MG tablet          Take by mouth.                          ?  ?No current facility-administered medications on file prior to visit. ?  ?  ?Family History ?Family History ?Problem           Relation           Age of Onset ?           Dementia         Mother   ?           Coronary Artery Disease (Blocked arteries around heart)     Father    ?  ?  ?  ?Social History ?  ?Tobacco Use ?Smoking Status           Former ?           Types: Cigarettes ?           Quit date:        04/2013 ?           Years since quitting:   8.3 ?Smokeless Tobacco   Never ?  ?  ?Social History ?Social History ?  ?  ?Socioeconomic History ?           Marital status:  Married ?           Number of children:    2 ?            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           Smoking status:          Former ?                          Types: Cigarettes ?                          Quit date:        04/2013 ?                          Years since quitting:   8.3 ?           Smokeless tobacco:    Never ?Substance and Sexual Activity ?           Alcohol use:    Yes ?                          Comment: 1 -2 /DAY ?           Sexual activity:            Defer ?Social History Narrative ?             [employer] Sales, [employer] graduate  ?  ?  ?  ?Objective: ?  ?  ?Vitals: ?             08/10/21 1009 ?BP:      138/82 ?Pulse:  72 ?Temp:  36.8 ?C (98.3 ?F) ?SpO2:  98% ?Weight:            99.5 kg (219 lb 6.4 oz) ?Height: 176.6 cm (5' 9.54") ?  ?Body mass index is 31.9 kg/m?Marland Kitchen ?Physical Exam ?Constitutional:   ?   Appearance: Normal appearance.  ?HENT:  ?   Head: Normocephalic and atraumatic.  ?   Nose: Nose normal. No congestion.  ?   Mouth/Throat:  ?   Mouth: Mucous membranes are moist.  ?   Pharynx: Oropharynx is clear.  ?Eyes:  ?   Pupils: Pupils are equal, round, and reactive to light.  ?Cardiovascular:  ?   Rate and Rhythm: Normal rate and regular rhythm.  ?   Pulses: Normal pulses.  ?   Heart sounds: Normal heart sounds. No murmur heard. ?  No friction rub. No gallop.  ?Pulmonary:  ?   Effort: Pulmonary effort is normal. No respiratory distress.  ?   Breath sounds: Normal breath sounds. No stridor. No wheezing, rhonchi or rales.  ?Abdominal:  ?   General: Abdomen is flat.  ?   Hernia: A hernia is present. Hernia is present in the right inguinal area.  ?Musculoskeletal:     ?   General: Normal range of motion.  ?   Cervical back: Normal range of motion.  ?Skin: ?   General: Skin is warm and dry.  ?Neurological:  ?   General: No focal deficit present.  ?   Mental Status: He is alert and oriented to person, place, and time.  ?Psychiatric:     ?   Mood and Affect: Mood normal.     ?    Thought Content: Thought content normal.  ?  ?  ?  ?  ?Assessment and Plan: ?Diagnoses and all orders for this visit: ?  ?Unilateral inguinal hernia without obstruction or gangrene, recurrence not  specified ?  ?  ?Jesse English is a 61 y.o. male  ?  ?1.          We will proceed to the OR for a lap inguinal hernia repair with mesh. ?2.         All risks and benefits were discussed with the patient, to generally include infection, bleeding, damage to surrounding structures, acute and chronic nerve pain, and recurrence. Alternatives were offered and described.  All questions were answered and the patient voiced understanding of the procedure and wishes to proceed at this point. ? ?

## 2021-09-16 NOTE — Discharge Instructions (Addendum)
[facility] _______[facility] ? ?INGUINAL HERNIA REPAIR: POST OP INSTRUCTIONS ? ?Always review your discharge instruction sheet given to you by the facility where your surgery was performed. ?IF YOU HAVE DISABILITY OR FAMILY LEAVE FORMS, YOU MUST BRING THEM TO THE OFFICE FOR PROCESSING.   ?DO NOT GIVE THEM TO YOUR DOCTOR. ? ?1. A  prescription for pain medication may be given to you upon discharge.  Take your pain medication as prescribed, if needed.  If narcotic pain medicine is not needed, then you may take acetaminophen (Tylenol) or ibuprofen (Advil) as needed. ?2. Take your usually prescribed medications unless otherwise directed. ?If you need a refill on your pain medication, please contact your pharmacy.  They will contact our office to request authorization. Prescriptions will not be filled after 5 pm or on week-ends. ?3. You should follow a light diet the first 24 hours after arrival home, such as soup and crackers, etc.  Be sure to include lots of fluids daily.  Resume your normal diet the day after surgery. ?4.Most patients will experience some swelling and bruising around the umbilicus or in the groin and scrotum.  Ice packs and reclining will help.  Swelling and bruising can take several days to resolve.  ?6. It is common to experience some constipation if taking pain medication after surgery.  Increasing fluid intake and taking a stool softener (such as Colace) will usually help or prevent this problem from occurring.  A mild laxative (Milk of Magnesia or Miralax) should be taken according to package directions if there are no bowel movements after 48 hours. ?7. Unless discharge instructions indicate otherwise, you may remove your bandages 24-48 hours after surgery, and you may shower at that time.  You may have steri-strips (small skin tapes) in place directly over the incision.  These strips should be left on the skin for 7-10 days.  If your surgeon used skin glue on the incision, you may  shower in 24 hours.  The glue will flake off over the next 2-3 weeks.  Any sutures or staples will be removed at the office during your follow-up visit. ?8. ACTIVITIES:  You may resume regular (light) daily activities beginning the next day--such as daily self-care, walking, climbing stairs--gradually increasing activities as tolerated.  You may have sexual intercourse when it is comfortable.  Refrain from any heavy lifting or straining until approved by your doctor. ? ?a.You may drive when you are no longer taking prescription pain medication, you can comfortably wear a seatbelt, and you can safely maneuver your car and apply brakes. ?b.RETURN TO WORK:   ?_____________________________________________ ? ?9.You should see your doctor in the office for a follow-up appointment approximately 2-3 weeks after your surgery.  Make sure that you call for this appointment within a day or two after you arrive home to insure a convenient appointment time. ?10.OTHER INSTRUCTIONS: _________________________ ?   _____________________________________ ? ?WHEN TO CALL YOUR DOCTOR: ?Fever over 101.0 ?Inability to urinate ?Nausea and/or vomiting ?Extreme swelling or bruising ?Continued bleeding from incision. ?Increased pain, redness, or drainage from the incision ? ?The clinic staff is available to answer your questions during regular business hours.  Please don?t hesitate to call and ask to speak to one of the nurses for clinical concerns.  If you have a medical emergency, go to the nearest emergency room or call 911.  A surgeon from [facility] is always on call at the hospital ? ? ?[address], [place], [place], [place]  [address] ? ? [address], [place], [place]   [address] ?[phone] ? [phone] ? FAX [phone] ?Web site: [email or web address] ? ?

## 2021-09-16 NOTE — Anesthesia Procedure Notes (Signed)
Procedure Name: Intubation ?Date/Time: 09/16/2021 12:07 PM ?Performed by: Vonna Drafts, CRNA ?Pre-anesthesia Checklist: Patient identified, Emergency Drugs available, Suction available and Patient being monitored ?Patient Re-evaluated:Patient Re-evaluated prior to induction ?Oxygen Delivery Method: Circle system utilized ?Preoxygenation: Pre-oxygenation with 100% oxygen ?Induction Type: IV induction ?Ventilation: Mask ventilation without difficulty and Oral airway inserted - appropriate to patient size ?Laryngoscope Size: 4 and Glidescope ?Grade View: Grade I ?Tube type: Oral ?Tube size: 7.5 mm ?Number of attempts: 1 ?Airway Equipment and Method: Stylet and Oral airway ?Placement Confirmation: ETT inserted through vocal cords under direct vision, positive ETCO2 and breath sounds checked- equal and bilateral ?Secured at: 23 cm ?Tube secured with: Tape ?Dental Injury: Teeth and Oropharynx as per pre-operative assessment  ? ? ? ? ?

## 2021-09-16 NOTE — Op Note (Signed)
09/16/2021 ? ?1:07 PM ? ?PATIENT:  Jesse English  61 y.o. male ? ?PRE-OPERATIVE DIAGNOSIS:  RIGHT INGUINAL HERNIA,UMBILICAL HERNIA ? ?POST-OPERATIVE DIAGNOSIS:  RIGHT INGUINAL HERNIA,UMBILICAL HERNIA ? ?PROCEDURE:  Procedure(s): ?LAPAROSCOPIC RIGHT INGUINAL HERNIA (Right) ?PRIMARY HERNIA REPAIR UMBILICAL ADULT (N/A) ?INSERTION OF MESH (Right) ? ?SURGEON:  Surgeon(s) and Role: ?   Ralene Ok, MD - Primary ? ?ASSISTANTS: Pryor Curia, RNFA  ? ?ANESTHESIA:   local and general ? ?EBL:  20 mL  ? ?BLOOD ADMINISTERED:none ? ?DRAINS: none  ? ?LOCAL MEDICATIONS USED:  BUPIVICAINE  ? ?SPECIMEN:  No Specimen ? ?DISPOSITION OF SPECIMEN:  N/A ? ?COUNTS:  YES ? ?TOURNIQUET:  * No tourniquets in log * ? ?DICTATION: .Dragon Dictation ? ?Counts: reported as correct x 2 ? ?Findings:  The patient had a large right indirect hernia and a moderated sized cord lipoma ? ?Indications for procedure:  The patient is a 61 year old male with a right inguinal hernia for several months. Patient complained of symptomatology to his right inguinal area. The patient was taken back for elective inguinal hernia repair. ? ?Details of the procedure: The patient was taken back to the operating room. The patient was placed in supine position with bilateral SCDs in place.  The patient was prepped and draped in the usual sterile fashion.  After appropriate anitbiotics were confirmed, a time-out was confirmed and all facts were verified. ? ?0.25% Marcaine was used to infiltrate the umbilical area. A 11-blade was used to cut down the skin and blunt dissection was used to get the anterior fashion.  The anterior fascia was incised approximately 1 cm and the muscles were retracted laterally. Blunt dissection was then used to create a space in the preperitoneal area. At this time a 10 mm camera was then introduced into the space and advanced the pubic tubercle and a 12 mm trocar was placed over this and insufflation was started.  At this time and  space was created from medial to laterally the preperitoneal space.  Cooper's ligament was initially cleaned off.  The hernia sac was identified in the indirect space. Dissection of the hernia sac and cord structures was undertaken the vas deferens was identified and protected in all parts of the case.  There was a small tear into the hernia sac. The tear was closed using a 0 Endoloop x 1. ? ?Once the hernia sac was taken down to approximately the umbilicus a Bard 3D Max mesh, size: Rachelle Hora, was  introduced into the preperitoneal space.  The mesh was brought over to cover the direct and indirect hernia spaces.  This was anchored into place and secured to Cooper's ligament with 4.52m staples from a Coviden hernia stapler. It was anchored to the anterior abdominal wall with 4.8 mm staples. The hernia sac was seen lying posterior to the mesh. There was no staples placed laterally. The insufflation was evacuated and the peritoneum was seen posterior to the mesh. The trochars were removed. The anterior fascia was reapproximated using #1 Vicryl on a UR- 6.  Intra-abdominal air was evacuated and the Veress needle removed.  ? ?I proceeded to dissected the umbilical stalk from the fascia.  At this time I could see there was a novafil stitch that was protruding up into the umbilicus.  I proceeded to reinforce this area with 0 ethibond sutures x3 so there would be no future hernia when I removed the previous suture.  The novafil suture was excised.  I attached the umbilicus to the fascia  with a 0 vicryl. ? ?The skin was reapproximated using 4-0 Monocryl subcuticular fashion and Dermabond. The patient was awakened from general anesthesia and taken to recovery in stable condition. ? ? ?PLAN OF CARE: Discharge to home after PACU ? ?PATIENT DISPOSITION:  PACU - hemodynamically stable. ?  ?Delay start of Pharmacological VTE agent (>24hrs) due to surgical blood loss or risk of bleeding: not applicable ? ?

## 2021-09-16 NOTE — Anesthesia Postprocedure Evaluation (Signed)
Anesthesia Post Note ? ?Patient: Jesse English ? ?Procedure(s) Performed: LAPAROSCOPIC RIGHT INGUINAL HERNIA (Right: Inguinal) ?HERNIA REPAIR UMBILICAL ADULT (Abdomen) ?INSERTION OF MESH (Right: Inguinal) ? ?  ? ?Patient location during evaluation: PACU ?Anesthesia Type: General ?Level of consciousness: awake and alert ?Pain management: pain level controlled ?Vital Signs Assessment: post-procedure vital signs reviewed and stable ?Respiratory status: spontaneous breathing, nonlabored ventilation, respiratory function stable and patient connected to nasal cannula oxygen ?Cardiovascular status: blood pressure returned to baseline and stable ?Postop Assessment: no apparent nausea or vomiting ?Anesthetic complications: no ? ? ?No notable events documented. ? ?Last Vitals:  ?Vitals:  ? 09/16/21 1330 09/16/21 1345  ?BP: (!) 141/69 134/76  ?Pulse: 66 60  ?Resp: 15 14  ?Temp:  36.4 ?C  ?SpO2: 95% 94%  ?  ?Last Pain:  ?Vitals:  ? 09/16/21 1345  ?TempSrc:   ?PainSc: 0-No pain  ? ? ?  ?  ?  ?  ?  ?  ? ?Barnet Glasgow ? ? ? ? ?

## 2021-09-16 NOTE — Interval H&P Note (Signed)
History and Physical Interval Note: ? ?09/16/2021 ?10:58 AM ? ?TOR TSUDA  has presented today for surgery, with the diagnosis of RIGHT INGUINAL HERNIA,UMBILICAL HERNIA.  The various methods of treatment have been discussed with the patient and family. After consideration of risks, benefits and other options for treatment, the patient has consented to  Procedure(s): ?LAPAROSCOPIC RIGHT INGUINAL HERNIA WITH MESH (Right) ?HERNIA REPAIR UMBILICAL ADULT (N/A) as a surgical intervention.  The patient's history has been reviewed, patient examined, no change in status, stable for surgery.  I have reviewed the patient's chart and labs.  Questions were answered to the patient's satisfaction.   ? ? ?Ralene Ok ? ? ?

## 2021-09-16 NOTE — Transfer of Care (Signed)
Immediate Anesthesia Transfer of Care Note ? ?Patient: Jesse English ? ?Procedure(s) Performed: LAPAROSCOPIC RIGHT INGUINAL HERNIA (Right: Inguinal) ?HERNIA REPAIR UMBILICAL ADULT (Abdomen) ?INSERTION OF MESH (Right: Inguinal) ? ?Patient Location: PACU ? ?Anesthesia Type:General ? ?Level of Consciousness: drowsy ? ?Airway & Oxygen Therapy: Patient Spontanous Breathing ? ?Post-op Assessment: Report given to RN and Post -op Vital signs reviewed and stable ? ?Post vital signs: Reviewed and stable ? ?Last Vitals:  ?Vitals Value Taken Time  ?BP 133/69 09/16/21 1314  ?Temp    ?Pulse    ?Resp 18 09/16/21 1314  ?SpO2    ?Vitals shown include unvalidated device data. ? ?Last Pain:  ?Vitals:  ? 09/16/21 1030  ?TempSrc:   ?PainSc: 0-No pain  ?   ? ?Patients Stated Pain Goal: 0 (09/16/21 1030) ? ?Complications: No notable events documented. ?

## 2021-09-19 ENCOUNTER — Encounter (HOSPITAL_COMMUNITY): Payer: Self-pay | Admitting: General Surgery

## 2021-11-28 ENCOUNTER — Other Ambulatory Visit: Payer: Self-pay | Admitting: Gastroenterology

## 2021-11-28 ENCOUNTER — Other Ambulatory Visit: Payer: Self-pay

## 2021-11-28 MED ORDER — HUMIRA (2 PEN) 40 MG/0.4ML ~~LOC~~ AJKT: AUTO-INJECTOR | SUBCUTANEOUS | 6 refills | Status: DC

## 2022-02-17 ENCOUNTER — Other Ambulatory Visit: Payer: Self-pay | Admitting: Internal Medicine

## 2022-02-17 DIAGNOSIS — F17201 Nicotine dependence, unspecified, in remission: Secondary | ICD-10-CM

## 2022-03-15 ENCOUNTER — Ambulatory Visit
Admission: RE | Admit: 2022-03-15 | Discharge: 2022-03-15 | Disposition: A | Discharge status: Home / Self Care | Payer: 59 | Source: Ambulatory Visit | Attending: Internal Medicine | Admitting: Internal Medicine

## 2022-03-15 DIAGNOSIS — F17201 Nicotine dependence, unspecified, in remission: Secondary | ICD-10-CM

## 2022-05-16 ENCOUNTER — Telehealth: Payer: Self-pay

## 2022-05-16 NOTE — Telephone Encounter (Signed)
PA needed for Humira per [employer]. Thanks

## 2022-05-18 ENCOUNTER — Other Ambulatory Visit: Payer: Self-pay

## 2022-05-18 ENCOUNTER — Telehealth: Payer: Self-pay | Admitting: Pharmacy Technician

## 2022-05-18 ENCOUNTER — Other Ambulatory Visit (HOSPITAL_COMMUNITY): Payer: Self-pay

## 2022-05-18 MED ORDER — HUMIRA (2 PEN) 40 MG/0.4ML ~~LOC~~ AJKT
AUTO-INJECTOR | SUBCUTANEOUS | 6 refills | Status: DC
Start: 2022-05-18 — End: 2022-12-26

## 2022-05-18 NOTE — Telephone Encounter (Signed)
PA has been submitted and telephone encounter has been created.

## 2022-05-18 NOTE — Telephone Encounter (Signed)
Patient Advocate Encounter  Received notification from [employer] that prior authorization for HUMIRA 40MG PEN is required.   PA submitted on 11.9.23 Key [id number] Status is pending    Luciano Cutter, CPhT Patient Advocate Phone: [phone]

## 2022-05-18 NOTE — Telephone Encounter (Signed)
Pharmacy Patient Advocate Encounter  Prior Authorization for Humira 59m/0.4ml has been approved.    PA# [id number] dates: 05/18/2022 through 05/19/2023

## 2022-07-26 ENCOUNTER — Ambulatory Visit (INDEPENDENT_AMBULATORY_CARE_PROVIDER_SITE_OTHER): Payer: 59 | Admitting: Gastroenterology

## 2022-07-26 ENCOUNTER — Encounter: Payer: Self-pay | Admitting: Gastroenterology

## 2022-07-26 ENCOUNTER — Ambulatory Visit (INDEPENDENT_AMBULATORY_CARE_PROVIDER_SITE_OTHER)
Admission: RE | Admit: 2022-07-26 | Discharge: 2022-07-26 | Disposition: A | Discharge status: Home / Self Care | Payer: 59 | Source: Ambulatory Visit | Attending: Gastroenterology | Admitting: Gastroenterology

## 2022-07-26 ENCOUNTER — Other Ambulatory Visit (INDEPENDENT_AMBULATORY_CARE_PROVIDER_SITE_OTHER): Payer: 59

## 2022-07-26 VITALS — BP 128/78 | HR 54 | Ht 70.0 in | Wt 218.0 lb

## 2022-07-26 DIAGNOSIS — K5010 Crohn's disease of large intestine without complications: Secondary | ICD-10-CM | POA: Diagnosis not present

## 2022-07-26 LAB — CBC WITH DIFFERENTIAL/PLATELET
Basophils Absolute: 0 10*3/uL (ref 0.0–0.1)
Basophils Relative: 0.7 % (ref 0.0–3.0)
Eosinophils Absolute: 0.1 10*3/uL (ref 0.0–0.7)
Eosinophils Relative: 1.8 % (ref 0.0–5.0)
HCT: 40.3 % (ref 39.0–52.0)
Hemoglobin: 14 g/dL (ref 13.0–17.0)
Lymphocytes Relative: 24.3 % (ref 12.0–46.0)
Lymphs Abs: 1.5 10*3/uL (ref 0.7–4.0)
MCHC: 34.8 g/dL (ref 30.0–36.0)
MCV: 90.3 fl (ref 78.0–100.0)
Monocytes Absolute: 0.9 10*3/uL (ref 0.1–1.0)
Monocytes Relative: 14.9 % — ABNORMAL HIGH (ref 3.0–12.0)
Neutro Abs: 3.5 10*3/uL (ref 1.4–7.7)
Neutrophils Relative %: 58.3 % (ref 43.0–77.0)
Platelets: 173 10*3/uL (ref 150.0–400.0)
RBC: 4.46 Mil/uL (ref 4.22–5.81)
RDW: 13.2 % (ref 11.5–15.5)
WBC: 6 10*3/uL (ref 4.0–10.5)

## 2022-07-26 LAB — IBC + FERRITIN
Ferritin: 89.9 ng/mL (ref 22.0–322.0)
Iron: 91 ug/dL (ref 42–165)
Saturation Ratios: 31.1 % (ref 20.0–50.0)
TIBC: 292.6 ug/dL (ref 250.0–450.0)
Transferrin: 209 mg/dL — ABNORMAL LOW (ref 212.0–360.0)

## 2022-07-26 LAB — COMPREHENSIVE METABOLIC PANEL
ALT: 18 U/L (ref 0–53)
AST: 26 U/L (ref 0–37)
Albumin: 4.3 g/dL (ref 3.5–5.2)
Alkaline Phosphatase: 36 U/L — ABNORMAL LOW (ref 39–117)
BUN: 23 mg/dL (ref 6–23)
CO2: 29 mEq/L (ref 19–32)
Calcium: 9.1 mg/dL (ref 8.4–10.5)
Chloride: 101 mEq/L (ref 96–112)
Creatinine, Ser: 1.22 mg/dL (ref 0.40–1.50)
GFR: 62.86 mL/min (ref >60.00)
Glucose, Bld: 78 mg/dL (ref 70–99)
Potassium: 3.7 mEq/L (ref 3.5–5.1)
Sodium: 137 mEq/L (ref 135–145)
Total Bilirubin: 1.5 mg/dL — ABNORMAL HIGH (ref 0.2–1.2)
Total Protein: 7.6 g/dL (ref 6.0–8.3)

## 2022-07-26 LAB — HIGH SENSITIVITY CRP: CRP, High Sensitivity: 0.71 mg/L (ref 0.000–5.000)

## 2022-07-26 NOTE — Progress Notes (Signed)
Jesse English    [id number]    10/20/1960  Primary Care Physician:Shaw, Emily Filbert., MD  Referring Physician: Ginger Organ., MD [place],  [place] [address]   Chief complaint:  Crohn's colitis  HPI:  62 year old very pleasant gentleman with history of Crohn's disease complicated by perianal abscess and fistula currently in clinical remission on Humira here for follow-up visit   He was previously on Entyvio, was switched to Humira as he was having difficulty with IV placement for infusions every 2 months.  He is tolerating Humira well, denies any reaction or skin rash   Overall he is doing well. Denies any nausea, vomiting, abdominal pain, change in bowel habits melena or bright red blood per rectum   He is exercising every day, runs approximately 2 to 3 miles per day.  No longer plays golf due to low back pain but otherwise is very active and overall feels well.   Outpatient Encounter Medications as of 07/26/2022  Medication Sig   HUMIRA PEN 40 MG/0.4ML PNKT INJECT '40MG'$  SUBCUTANEOUSLY  EVERY 2 WEEKS   hydrochlorothiazide (MICROZIDE) 12.5 MG capsule Take 12.5 mg by mouth daily.   Multiple Vitamin (MULTIVITAMIN WITH MINERALS) TABS tablet Take 1 tablet by mouth daily.   rosuvastatin (CRESTOR) 20 MG tablet Take 20 mg by mouth daily.   traMADol (ULTRAM) 50 MG tablet Take 1 tablet (50 mg total) by mouth every 6 (six) hours as needed.   No facility-administered encounter medications on file as of 07/26/2022.    Allergies as of 07/26/2022 - Review Complete 09/16/2021  Allergen Reaction Noted   Ace inhibitors Swelling and Other (See Comments) 09/23/2015    Past Medical History:  Diagnosis Date   Cancer [facility]) 2019   prostate   Colon polyps    2013   Crohn's colitis ([place])    vs UC with abscess   Diverticulosis    Fournier's gangrene in male 04/2016   Gout    Hypertension    Perirectal abscess 04/2016   Prolapsed internal hemorrhoid, grade  4    Pulmonary embolism ([place])    Sleep apnea    cpap    Past Surgical History:  Procedure Laterality Date   ANAL FISSURE REPAIR     COLONOSCOPY     HEMORRHOID SURGERY  04/21/2016   Procedure: HEMORRHOIDECTOMY;  Surgeon: Michael Boston, MD;  Location: [facility];  Service: General;;   INCISION AND DRAINAGE ABSCESS N/A 04/21/2016   Procedure: INCISION AND DRAINAGE OF HORSESHOE  FOURNIER GANGRENE PERIRECTAL ABSCESS;  Surgeon: Michael Boston, MD;  Location: [facility];  Service: General;  Laterality: N/A;   INGUINAL HERNIA REPAIR Right 09/16/2021   Procedure: LAPAROSCOPIC RIGHT INGUINAL HERNIA;  Surgeon: Ralene Ok, MD;  Location: [facility];  Service: General;  Laterality: Right;   INSERTION OF MESH Right 09/16/2021   Procedure: INSERTION OF MESH;  Surgeon: Ralene Ok, MD;  Location: [facility];  Service: General;  Laterality: Right;   LYMPHADENECTOMY Bilateral 03/18/2018   Procedure: Noel Journey, PELVIC;  Surgeon: Raynelle Bring, MD;  Location: [facility];  Service: Urology;  Laterality: Bilateral;   ROBOT ASSISTED LAPAROSCOPIC RADICAL PROSTATECTOMY N/A 03/18/2018   Procedure: XI ROBOTIC ASSISTED LAPAROSCOPIC RADICAL PROSTATECTOMY LEVEL 2; UMBILICAL HERNIA REPAIR;  Surgeon: Raynelle Bring, MD;  Location: [facility];  Service: Urology;  Laterality: N/A;   UMBILICAL HERNIA REPAIR N/A 09/16/2021   Procedure: HERNIA REPAIR UMBILICAL ADULT;  Surgeon: Ralene Ok, MD;  Location:  [facility];  Service: General;  Laterality: N/A;   WISDOM TOOTH EXTRACTION  1976    Family History  Problem Relation Age of Onset   Pneumonia Mother    Pneumonia Father    Colon cancer Neg Hx    Esophageal cancer Neg Hx    Stomach cancer Neg Hx    Rectal cancer Neg Hx     Social History   Socioeconomic History   Marital status: Married    Spouse name: Not on file   Number of children: 2   Years of education: Not on file   Highest education level: Not on file  Occupational History   Occupation: sales  Tobacco Use    Smoking status: Former    Types: Cigars    Quit date: 07/10/2013    Years since quitting: 9.0   Smokeless tobacco: Never  Vaping Use   Vaping Use: Never used  Substance and Sexual Activity   Alcohol use: Not Currently   Drug use: No   Sexual activity: Not on file  Other Topics Concern   Not on file  Social History Narrative   Not on file   Social Determinants of Health   Financial Resource Strain: Not on file  Food Insecurity: Not on file  Transportation Needs: Not on file  Physical Activity: Not on file  Stress: Not on file  Social Connections: Not on file  Intimate Partner Violence: Not on file      Review of systems: All other review of systems negative except as mentioned in the HPI.   Physical Exam: Vitals:   07/26/22 1105  BP: 128/78  Pulse: (Abnormal) 54  SpO2: 98%   Body mass index is 31.28 kg/m. Gen:      No acute distress HEENT:  sclera anicteric Abd:      soft, non-tender; no palpable masses, no distension Ext:    No edema Neuro: alert and oriented x 3 Psych: normal mood and affect  Data Reviewed:  Reviewed labs, radiology imaging, old records and pertinent past GI work up   Assessment and Plan/Recommendations:   62 year old very pleasant gentleman with history of Crohn's colitis complicated by perianal abscess and fistula in 2017. C. difficile colitis in 2017 History of DVT and PE status post anticoagulation in 2017 for 6 months History of prostate carcinoma s/p radical prostatectomy September 2019   Crohn's disease: Clinical remission on maintenance therapy with Humira every 2 weeks, continue current regimen   IBD health maintenance: Due for TB QuantiFERON gold, CBC, CMP, ESR, CRP, vitamin D, iron panel, B12 and folate We will check fecal calprotectin to monitor disease activity  Obtain bone density scan to exclude osteopenia or osteoporosis  He is up-to-date with vaccinations    Last Colonoscopy -  01/11/2017 with no active inflammation  or dysplasia Recall colonoscopy due July 2025   Discussed need for yearly exams with dermatology and dental for health maintenance with history of chronic inflammatory bowel disease and chronic immunosuppressive   Return in 1 year or sooner if needed  This visit required 30 minutes of patient care (this includes precharting, chart review, review of results, face-to-face time used for counseling as well as treatment plan and follow-up. The patient was provided an opportunity to ask questions and all were answered. The patient agreed with the plan and demonstrated an understanding of the instructions.  Damaris Hippo , MD    CC: Ginger Organ., MD

## 2022-07-26 NOTE — Patient Instructions (Addendum)
Your provider has requested that you go to the basement level for lab work before leaving today. Press "B" on the elevator. The lab is located at the first door on the left as you exit the elevator.   You have been scheduled for a bone density test on 07/26/2022 at Today ______. Please arrive 15 minutes prior to your scheduled appointment to radiology on the basement floor of [place] location for this test. If you need to cancel or reschedule for any reason, please contact radiology at [phone].  Preparation for test is as follows:  If you are taking calcium, discontinue this 24-48 hours prior to your appointment.  Wear pants with an elastic waistband (or without any metal such as a zipper).  Do not wear an underwire bra.  We do have gowns if you are unable to find appropriate clothing without metal.  Please bring a list of all current medications.    Due to recent changes in healthcare laws, you may see the results of your imaging and laboratory studies on MyChart before your provider has had a chance to review them.  We understand that in some cases there may be results that are confusing or concerning to you. Not all laboratory results come back in the same time frame and the provider may be waiting for multiple results in order to interpret others.  Please give [place] 48 hours in order for your provider to thoroughly review all the results before contacting the office for clarification of your results.    _______________________________________________________  If your blood pressure at your visit was 140/90 or greater, please contact your primary care physician to follow up on this.  _______________________________________________________  If you are age 43 or older, your body mass index should be between 23-30. Your Body mass index is 31.28 kg/m. If this is out of the aforementioned range listed, please consider follow up with your Primary Care Provider.  If you are  age 35 or younger, your body mass index should be between 19-25. Your Body mass index is 31.28 kg/m. If this is out of the aformentioned range listed, please consider follow up with your Primary Care Provider.   ________________________________________________________  The [facility] providers would like to encourage you to use [facility] to communicate with providers for non-urgent requests or questions.  Due to long hold times on the telephone, sending your provider a message by [facility] may be a faster and more efficient way to get a response.  Please allow 48 business hours for a response.  Please remember that this is for non-urgent requests.  _______________________________________________________   Thank you for choosing [facility]  Kavitha Nandigam,MD

## 2022-07-28 LAB — QUANTIFERON-TB GOLD PLUS
Mitogen-NIL: >10 IU/mL
NIL: 0.03 IU/mL
QuantiFERON-TB Gold Plus: NEGATIVE
TB1-NIL: 0 IU/mL
TB2-NIL: 0 IU/mL

## 2022-12-25 ENCOUNTER — Other Ambulatory Visit: Payer: Self-pay | Admitting: Gastroenterology

## 2023-01-23 ENCOUNTER — Telehealth: Payer: Self-pay | Admitting: Pharmacy Technician

## 2023-01-23 NOTE — Telephone Encounter (Signed)
Pt is requesting a product replacement. Please complete prescription and fax back to [facility] at [phone].

## 2023-06-13 ENCOUNTER — Other Ambulatory Visit (HOSPITAL_COMMUNITY): Payer: Self-pay

## 2023-06-13 ENCOUNTER — Telehealth: Payer: Self-pay | Admitting: Pharmacy Technician

## 2023-06-13 NOTE — Telephone Encounter (Signed)
 Pharmacy Patient Advocate Encounter   Received notification from [employer] that prior authorization for HUMIRA 40MG  is required/requested.   Insurance verification completed.   The patient is insured through [facility] .   Per test claim: PA required; PA submitted to above mentioned insurance via CoverMyMeds Key/confirmation #/EOC [id number] Status is pending

## 2023-07-04 ENCOUNTER — Other Ambulatory Visit: Payer: Self-pay | Admitting: Gastroenterology

## 2023-09-18 ENCOUNTER — Telehealth: Payer: Self-pay

## 2023-09-18 NOTE — Telephone Encounter (Signed)
 Patient scheduled for follow up appointment.

## 2023-09-18 NOTE — Telephone Encounter (Signed)
 Please schedule follow-up visit with me next available appointment.  Thank you

## 2023-09-18 NOTE — Telephone Encounter (Signed)
 Called the patient to schedule his follow up appointment. Last seen in January of 2024. Scheduled for 11/20/23 at 11:00 am. Patient's insurance [employer] is no longer covering Humira. Asked the patient if he had enough medication. Patient reports he stopped Humira a year ago. He has not had any changes in his bowel movements or symptoms of a flare. Patient tells me "I feel great." Instructed to call us if he develops any symptoms or has concerns.

## 2023-11-13 ENCOUNTER — Telehealth: Payer: Self-pay | Admitting: Gastroenterology

## 2023-11-13 NOTE — Telephone Encounter (Signed)
 Patient will be out of town on 11/20/23. Moved appointment to 01/08/24. His recall for colonoscopy is in July. Appointment tentatively for 01/25/24. He will need instructions during the visit.

## 2023-11-13 NOTE — Telephone Encounter (Signed)
 Inbound call from patient, requesting to reschedule his appointment for 5/13 at 11:00 AM. Patient states he will be out of town that day but can do any other date and time. Patient states he would really like to have this appointment due to discussing the possibility of a colonoscopy in June. Patient would like to speak to [facility] in regards to another appt.

## 2023-11-20 ENCOUNTER — Ambulatory Visit: Admitting: Gastroenterology

## 2024-01-08 ENCOUNTER — Encounter: Payer: Self-pay | Admitting: Gastroenterology

## 2024-01-08 ENCOUNTER — Ambulatory Visit (INDEPENDENT_AMBULATORY_CARE_PROVIDER_SITE_OTHER): Payer: Self-pay | Admitting: Gastroenterology

## 2024-01-08 ENCOUNTER — Other Ambulatory Visit (INDEPENDENT_AMBULATORY_CARE_PROVIDER_SITE_OTHER)

## 2024-01-08 VITALS — BP 110/62 | HR 58 | Ht 70.0 in | Wt 212.5 lb

## 2024-01-08 DIAGNOSIS — E559 Vitamin D deficiency, unspecified: Secondary | ICD-10-CM

## 2024-01-08 DIAGNOSIS — E538 Deficiency of other specified B group vitamins: Secondary | ICD-10-CM

## 2024-01-08 DIAGNOSIS — K5010 Crohn's disease of large intestine without complications: Secondary | ICD-10-CM

## 2024-01-08 LAB — CBC WITH DIFFERENTIAL/PLATELET
Basophils Absolute: 0 10*3/uL (ref 0.0–0.1)
Basophils Relative: 0.5 % (ref 0.0–3.0)
Eosinophils Absolute: 0.1 10*3/uL (ref 0.0–0.7)
Eosinophils Relative: 1 % (ref 0.0–5.0)
HCT: 41.2 % (ref 39.0–52.0)
Hemoglobin: 13.9 g/dL (ref 13.0–17.0)
Lymphocytes Relative: 17.8 % (ref 12.0–46.0)
Lymphs Abs: 1.2 10*3/uL (ref 0.7–4.0)
MCHC: 33.8 g/dL (ref 30.0–36.0)
MCV: 90 fl (ref 78.0–100.0)
Monocytes Absolute: 0.8 10*3/uL (ref 0.1–1.0)
Monocytes Relative: 12.2 % — ABNORMAL HIGH (ref 3.0–12.0)
Neutro Abs: 4.8 10*3/uL (ref 1.4–7.7)
Neutrophils Relative %: 68.5 % (ref 43.0–77.0)
Platelets: 175 10*3/uL (ref 150.0–400.0)
RBC: 4.58 Mil/uL (ref 4.22–5.81)
RDW: 13.4 % (ref 11.5–15.5)
WBC: 7 10*3/uL (ref 4.0–10.5)

## 2024-01-08 LAB — COMPREHENSIVE METABOLIC PANEL WITH GFR
ALT: 16 U/L (ref 0–53)
AST: 25 U/L (ref 0–37)
Albumin: 4.2 g/dL (ref 3.5–5.2)
Alkaline Phosphatase: 37 U/L — ABNORMAL LOW (ref 39–117)
BUN: 27 mg/dL — ABNORMAL HIGH (ref 6–23)
CO2: 31 meq/L (ref 19–32)
Calcium: 9.3 mg/dL (ref 8.4–10.5)
Chloride: 99 meq/L (ref 96–112)
Creatinine, Ser: 1.37 mg/dL (ref 0.40–1.50)
GFR: 54.14 mL/min — ABNORMAL LOW (ref >60.00)
Glucose, Bld: 84 mg/dL (ref 70–99)
Potassium: 3.8 meq/L (ref 3.5–5.1)
Sodium: 138 meq/L (ref 135–145)
Total Bilirubin: 2.2 mg/dL — ABNORMAL HIGH (ref 0.2–1.2)
Total Protein: 7.7 g/dL (ref 6.0–8.3)

## 2024-01-08 LAB — B12 AND FOLATE PANEL
Folate: 8 ng/mL (ref >5.9)
Vitamin B-12: 357 pg/mL (ref 211–911)

## 2024-01-08 LAB — VITAMIN D 25 HYDROXY (VIT D DEFICIENCY, FRACTURES): VITD: 17 ng/mL — ABNORMAL LOW (ref 30.00–100.00)

## 2024-01-08 LAB — C-REACTIVE PROTEIN: CRP: <1 mg/dL (ref 0.5–20.0)

## 2024-01-08 MED ORDER — NA SULFATE-K SULFATE-MG SULF 17.5-3.13-1.6 GM/177ML PO SOLN: 1.0000 | Freq: Once | ORAL | 0 refills | Status: AC

## 2024-01-08 NOTE — Patient Instructions (Addendum)
 You have been scheduled for a colonoscopy. Please follow written instructions given to you at your visit today.   If you use inhalers (even only as needed), please bring them with you on the day of your procedure.  DO NOT TAKE 7 DAYS PRIOR TO TEST- Trulicity (dulaglutide) Ozempic, Wegovy (semaglutide) Mounjaro (tirzepatide) Bydureon Bcise (exanatide extended release)  DO NOT TAKE 1 DAY PRIOR TO YOUR TEST Rybelsus (semaglutide) Adlyxin (lixisenatide) Victoza (liraglutide) Byetta (exanatide) ___________________________________________________________________________  Your provider has requested that you go to the basement level for lab work before leaving today. Press B on the elevator. The lab is located at the first door on the left as you exit the elevator.  VISIT SUMMARY:  You came in for a follow-up visit to discuss your colitis and prepare for your upcoming colonoscopy. Your colitis is in remission, and you have not had any symptoms since stopping Humira  last year. We also discussed your low blood pressure, which is not currently a concern.  YOUR PLAN:  Crohn's COLITIS: Your ulcerative colitis is in remission, and you have been off Humira  for over a year without symptoms or flare-ups. -We will order blood tests (CBC and CMP) to assess your overall health and inflammation markers. -We will order a fecal calprotectin test to evaluate intestinal inflammation. -You will have a colonoscopy on July 18th to assess the status of your colitis. -You will receive a colonoscopy preparation kit and instructions. -We will hold off on restarting Humira  unless inflammation markers are elevated or symptoms recur. -If inflammation is detected, we may consider starting you on mesalamine  or sulfasalazine.  LOW BLOOD PRESSURE: Your blood pressure is 110/62, which is low but not concerning. -Continue to maintain adequate hydration, especially in hot weather.  Due to recent changes in healthcare  laws, you may see the results of your imaging and laboratory studies on MyChart before your provider has had a chance to review them.  We understand that in some cases there may be results that are confusing or concerning to you. Not all laboratory results come back in the same time frame and the provider may be waiting for multiple results in order to interpret others.  Please give us  48 hours in order for your provider to thoroughly review all the results before contacting the office for clarification of your results.    I appreciate the  opportunity to care for you  Thank You   Kavitha Nandigam , MD

## 2024-01-08 NOTE — Progress Notes (Signed)
 "                Jesse English    [id number]    1960/09/13  Primary Care Physician:Shaw, Elsie JONETTA Raddle., MD  Referring Physician: Loreli Elsie JONETTA Raddle., MD [address] [place],  [place] [address]   Chief complaint:  Crohn's disease  Discussed the use of AI scribe software for clinical note transcription with the patient, who gave verbal consent to proceed.  History of Present Illness Jesse English is a 63 year old male with colitis who presents for follow-up and preparation for an upcoming colonoscopy.  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 Biopsied. - Diverticulosis in the sigmoid colon and in the descending colon. - Proctitis. Inflammation was found in the rectum. This was mild in severity. The findings are improved compared to previous examinations.  Crohn's colitis complicated by perirectal abscesses and complex transsphincteric perianal fistula, diagnosed in 2017.  In clinical remission on Entyvio  since 2018.  History of C. difficile colitis in April 2017 status post vancomycin .  DVT/PE status post 6 months of anticoagulation.  Switched to Humira  in 2022 as he was having difficulty with IV placement for Entyvio  infusions every 2 months.  S/p  prostatectomy radical with lymphadenoectomy and umbilical hernia repair 03/18/18 for prostate adenocarcionom   Outpatient Encounter Medications as of 01/08/2024  Medication Sig   hydrochlorothiazide  (MICROZIDE ) 12.5 MG capsule Take 12.5 mg by mouth daily.   rosuvastatin  (CRESTOR ) 20 MG tablet Take 20 mg by mouth daily.   tadalafil (CIALIS) 20 MG tablet Take 20 mg by mouth daily as needed. (Patient not taking: Reported on 01/08/2024)   [DISCONTINUED] HUMIRA , 2 PEN, 40 MG/0.4ML pen INJECT 1 PEN SUBCUTANEOUSLY  EVERY OTHER WEEK (Patient not taking: Reported on 01/08/2024)   [DISCONTINUED] Multiple Vitamin (MULTIVITAMIN WITH MINERALS) TABS tablet Take 1 tablet by mouth daily.   No facility-administered encounter medications on file as of 01/08/2024.    Allergies as of 01/08/2024 - Review Complete 01/08/2024  Allergen Reaction Noted   Ace inhibitors Swelling and Other (See Comments) 09/23/2015    Past Medical History:  Diagnosis Date   Cancer [facility]) 2019   prostate   Colon polyps    2013   Crohn's colitis (HCC)    vs UC with abscess   Diverticulosis    Fournier's gangrene in male 04/2016   Gout    Hypertension    Perirectal abscess 04/2016   Prolapsed internal hemorrhoid, grade 4    Pulmonary embolism (HCC)    Sleep apnea    cpap    Past Surgical History:  Procedure Laterality Date   ANAL FISSURE REPAIR     COLONOSCOPY     HEMORRHOID SURGERY  04/21/2016   Procedure: HEMORRHOIDECTOMY;  Surgeon: Elspeth Schultze, MD;  Location: [facility] ORS;  Service: General;;   INCISION AND DRAINAGE ABSCESS N/A 04/21/2016   Procedure: INCISION AND DRAINAGE OF HORSESHOE  FOURNIER GANGRENE PERIRECTAL ABSCESS;  Surgeon: Elspeth Schultze, MD;  Location: [facility];  Service: General;  Laterality: N/A;   INGUINAL HERNIA REPAIR Right 09/16/2021   Procedure: LAPAROSCOPIC RIGHT INGUINAL HERNIA;  Surgeon: Rubin Calamity, MD;  Location: [facility] OR;  Service: General;  Laterality: Right;   INSERTION OF MESH Right 09/16/2021    Procedure: INSERTION OF MESH;  Surgeon: Rubin Calamity, MD;  Location: [facility] OR;  Service: General;  Laterality: Right;   LYMPHADENECTOMY Bilateral 03/18/2018   Procedure: REDGIE, PELVIC;  Surgeon: Renda Glance, MD;  Location: [facility];  Service: Urology;  Laterality: Bilateral;   ROBOT ASSISTED LAPAROSCOPIC RADICAL PROSTATECTOMY N/A 03/18/2018   Procedure: XI ROBOTIC ASSISTED LAPAROSCOPIC RADICAL PROSTATECTOMY LEVEL 2; UMBILICAL HERNIA REPAIR;  Surgeon: Renda Glance, MD;  Location: [facility];  Service: Urology;  Laterality: N/A;   UMBILICAL HERNIA REPAIR N/A 09/16/2021   Procedure: HERNIA REPAIR UMBILICAL ADULT;  Surgeon: Rubin Calamity, MD;  Location: [facility] OR;  Service: General;  Laterality: N/A;   WISDOM TOOTH EXTRACTION  1976    Family History  Problem Relation Age of Onset   Pneumonia Mother    Pneumonia Father    Colon cancer Neg Hx    Esophageal cancer Neg Hx    Stomach cancer Neg Hx    Rectal cancer Neg Hx     Social History   Socioeconomic History   Marital status: Married    Spouse name: Not on file   Number of children: 2   Years of education: Not on file   Highest education level: Not on file  Occupational History   Occupation: sales  Tobacco Use   Smoking status: Former    Types: Cigars    Quit date: 07/10/2013    Years since quitting: 10.5   Smokeless tobacco: Never  Vaping Use   Vaping status: Never Used  Substance and Sexual Activity   Alcohol use: Not Currently   Drug use: No   Sexual activity: Not on file  Other Topics Concern   Not on file  Social History Narrative   Not on file   Social Drivers of Health   Financial Resource Strain: Not on file  Food Insecurity: Not on file  Transportation Needs: Not on file  Physical Activity: Not on file  Stress: Not on file  Social Connections: Not on file  Intimate Partner Violence: Not on file      Review of systems: All other review of systems negative except as mentioned in the  HPI.   Physical Exam: Vitals:   01/08/24 1116  BP: 110/62  Pulse: (!) 58  SpO2: 98%   Body mass index is 30.49 kg/m. Gen:      No acute distress HEENT:  sclera anicteric Abd:      soft, non-tender; no palpable masses, no distension Ext:    No edema Neuro: alert and oriented x 3 Psych: normal mood and affect  Data Reviewed:  Reviewed labs, radiology imaging, old records and pertinent past GI work up     Assessment and Plan Assessment & Plan  Crohn's colitis is in remission. He has been off Humira  for over a year without symptoms or flare-ups. The decision to hold off on Humira  is based on the absence of symptoms and potential decrease in benefits versus risks. - Order CBC and CMP to assess overall health and inflammation markers. - Order fecal calprotectin test  to evaluate intestinal inflammation. - Perform colonoscopy on July 18th to assess the status of the colitis. - Provide colonoscopy preparation kit and instructions. - Hold off on restarting Humira  unless inflammation markers are elevated or symptoms recur. - Consider mesalamine  or sulfasalazine if inflammation is detected.   The patient was provided an opportunity to ask questions and all were answered. The patient agreed with the plan and demonstrated an understanding of the instructions.  Jesse English , MD    CC: Loreli Elsie JONETTA Mickey., MD   "

## 2024-01-09 ENCOUNTER — Ambulatory Visit: Payer: Self-pay | Admitting: Gastroenterology

## 2024-01-09 ENCOUNTER — Encounter: Payer: Self-pay | Admitting: Gastroenterology

## 2024-01-09 MED ORDER — ERGOCALCIFEROL 1.25 MG (50000 UT) PO CAPS: 50000.0000 [IU] | ORAL_CAPSULE | ORAL | 0 refills | Status: AC

## 2024-01-14 ENCOUNTER — Other Ambulatory Visit

## 2024-01-14 DIAGNOSIS — E538 Deficiency of other specified B group vitamins: Secondary | ICD-10-CM

## 2024-01-14 DIAGNOSIS — E559 Vitamin D deficiency, unspecified: Secondary | ICD-10-CM

## 2024-01-14 DIAGNOSIS — K5010 Crohn's disease of large intestine without complications: Secondary | ICD-10-CM

## 2024-01-16 LAB — CALPROTECTIN, FECAL: Calprotectin, Fecal: 33 ug/g (ref 0–120)

## 2024-01-24 ENCOUNTER — Other Ambulatory Visit (HOSPITAL_COMMUNITY): Payer: Self-pay | Admitting: Internal Medicine

## 2024-01-24 ENCOUNTER — Other Ambulatory Visit: Payer: Self-pay | Admitting: Internal Medicine

## 2024-01-24 DIAGNOSIS — F17200 Nicotine dependence, unspecified, uncomplicated: Secondary | ICD-10-CM

## 2024-01-24 DIAGNOSIS — F17201 Nicotine dependence, unspecified, in remission: Secondary | ICD-10-CM

## 2024-01-24 DIAGNOSIS — Z136 Encounter for screening for cardiovascular disorders: Secondary | ICD-10-CM

## 2024-01-25 ENCOUNTER — Ambulatory Visit (AMBULATORY_SURGERY_CENTER): Admitting: Gastroenterology

## 2024-01-25 ENCOUNTER — Encounter: Payer: Self-pay | Admitting: Gastroenterology

## 2024-01-25 VITALS — BP 105/60 | HR 44 | Temp 97.9°F | Resp 10 | Ht 70.0 in | Wt 212.0 lb

## 2024-01-25 DIAGNOSIS — Z1211 Encounter for screening for malignant neoplasm of colon: Secondary | ICD-10-CM

## 2024-01-25 DIAGNOSIS — K5010 Crohn's disease of large intestine without complications: Secondary | ICD-10-CM

## 2024-01-25 DIAGNOSIS — K635 Polyp of colon: Secondary | ICD-10-CM | POA: Diagnosis not present

## 2024-01-25 DIAGNOSIS — K648 Other hemorrhoids: Secondary | ICD-10-CM | POA: Diagnosis not present

## 2024-01-25 DIAGNOSIS — K644 Residual hemorrhoidal skin tags: Secondary | ICD-10-CM

## 2024-01-25 DIAGNOSIS — D122 Benign neoplasm of ascending colon: Secondary | ICD-10-CM

## 2024-01-25 MED ORDER — SODIUM CHLORIDE 0.9 % IV SOLN: 500.0000 mL | Freq: Once | INTRAVENOUS | Status: DC

## 2024-01-25 NOTE — Progress Notes (Signed)
 Please refer to office visit note 01/08/24. No additional changes in H&P Patient is appropriate for planned procedure(s) and anesthesia in an ambulatory setting  K. Veena Lizzie Cokley , MD [phone]

## 2024-01-25 NOTE — Op Note (Signed)
 [facility] Patient Name: Jesse English Procedure Date: 01/25/2024 7:43 AM MRN: [id number] Endoscopist: Gustav ALONSO Mcgee , MD, [id number] Age: 63 Referring MD:  Date of Birth: [birth year] Gender: Male Account #: [id number] Procedure:                Colonoscopy Indications:              High risk colon cancer surveillance: Crohn's                            colitis of 8 (or more) years duration with                            one-third (or more) of the colon involved Medicines:                Monitored Anesthesia Care Procedure:                Pre-Anesthesia Assessment:                           - Prior to the procedure, a History and Physical                            was performed, and patient medications and                            allergies were reviewed. The patient's tolerance of                            previous anesthesia was also reviewed. The risks                            and benefits of the procedure and the sedation                            options and risks were discussed with the patient.                            All questions were answered, and informed consent                            was obtained. Prior Anticoagulants: The patient has                            taken no anticoagulant or antiplatelet agents. ASA                            Grade Assessment: II - A patient with mild systemic                            disease. After reviewing the risks and benefits,                            the patient was deemed in satisfactory condition to  undergo the procedure.                           After obtaining informed consent, the colonoscope                            was passed under direct vision. Throughout the                            procedure, the patient's blood pressure, pulse, and                            oxygen saturations were monitored continuously. The                            Olympus Scope SN  [id number] was introduced through the                            anus and advanced to the the cecum, identified by                            appendiceal orifice and ileocecal valve. The                            colonoscopy was performed without difficulty. The                            patient tolerated the procedure well. The quality                            of the bowel preparation was good. The terminal                            ileum, ileocecal valve, appendiceal orifice, and                            rectum were photographed. Scope In: 7:58:26 AM Scope Out: 8:19:49 AM Scope Withdrawal Time: 0 hours 16 minutes 17 seconds  Total Procedure Duration: 0 hours 21 minutes 23 seconds  Findings:                 The perianal and digital rectal examinations were                            normal.                           Two sessile polyps were found in the ascending                            colon. The polyps were 4 to 6 mm in size. These                            polyps were removed with a cold snare. Resection  and retrieval were complete.                           The Simple Endoscopic Score for Crohn's Disease was                            determined based on the endoscopic appearance of                            the mucosa in the following segments:                           - Ileum: Findings include no ulcers present, no                            ulcerated surfaces, no affected surfaces and no                            narrowings. Segment score: 0.                           - Right Colon: Findings include no ulcers present,                            no ulcerated surfaces, no affected surfaces and no                            narrowings. Segment score: 0.                           - Transverse Colon: Findings include no ulcers                            present, no ulcerated surfaces, no affected                            surfaces and no  narrowings. Segment score: 0.                           - Left Colon: Findings include no ulcers present,                            no ulcerated surfaces, no affected surfaces and no                            narrowings. Segment score: 0.                           - Rectum: Findings include no ulcers present, no                            ulcerated surfaces, no affected surfaces and no  narrowings. Segment score: 0.                           - Total SES-CD aggregate score: 0. Biopsies were                            taken with a cold forceps for histology.                           Non-bleeding external and internal hemorrhoids were                            found during retroflexion. The hemorrhoids were                            small. Complications:            No immediate complications. Estimated Blood Loss:     Estimated blood loss was minimal. Impression:               - Two 4 to 6 mm polyps in the ascending colon,                            removed with a cold snare. Resected and retrieved.                           - Simple Endoscopic Score for Crohn's Disease: 0,                            mucosal inflammatory changes secondary to quiescent                            Crohn's disease. Biopsied.                           - Non-bleeding external and internal hemorrhoids. Recommendation:           - Patient has a contact number available for                            emergencies. The signs and symptoms of potential                            delayed complications were discussed with the                            patient. Return to normal activities tomorrow.                            Written discharge instructions were provided to the                            patient.                           - Resume previous diet.                           -  Continue present medications.                           - Await pathology results.                            - Repeat colonoscopy in 5 years for surveillance                            based on pathology results.                           -Follow up in GI office in 1 year Gustav ALONSO Mcgee, MD 01/25/2024 8:26:49 AM This report has been signed electronically.

## 2024-01-25 NOTE — Progress Notes (Signed)
 Sedate, gd SR, tolerated procedure well, VSS, report to RN

## 2024-01-25 NOTE — Patient Instructions (Addendum)

## 2024-01-25 NOTE — Progress Notes (Signed)
 Pt's states no medical or surgical changes since previsit or office visit.

## 2024-01-25 NOTE — Progress Notes (Signed)
 Called to room to assist during endoscopic procedure.  Patient ID and intended procedure confirmed with present staff. Received instructions for my participation in the procedure from the performing physician.

## 2024-01-28 ENCOUNTER — Ambulatory Visit
Admission: RE | Admit: 2024-01-28 | Discharge: 2024-01-28 | Disposition: A | Discharge status: Home / Self Care | Source: Ambulatory Visit | Attending: Internal Medicine | Admitting: Internal Medicine

## 2024-01-28 ENCOUNTER — Telehealth: Payer: Self-pay

## 2024-01-28 DIAGNOSIS — F17200 Nicotine dependence, unspecified, uncomplicated: Secondary | ICD-10-CM

## 2024-01-28 NOTE — Telephone Encounter (Signed)
 Attempted f/u call. No answer, left VM.

## 2024-01-29 LAB — SURGICAL PATHOLOGY

## 2024-02-07 ENCOUNTER — Ambulatory Visit (HOSPITAL_COMMUNITY)
Admission: RE | Admit: 2024-02-07 | Discharge: 2024-02-07 | Disposition: A | Discharge status: Home / Self Care | Source: Ambulatory Visit | Attending: Internal Medicine | Admitting: Internal Medicine

## 2024-02-07 DIAGNOSIS — Z136 Encounter for screening for cardiovascular disorders: Secondary | ICD-10-CM | POA: Insufficient documentation

## 2024-02-07 DIAGNOSIS — F17201 Nicotine dependence, unspecified, in remission: Secondary | ICD-10-CM | POA: Insufficient documentation

## 2024-05-22 ENCOUNTER — Ambulatory Visit: Payer: Self-pay | Admitting: Gastroenterology

## 2024-06-13 ENCOUNTER — Encounter (HOSPITAL_COMMUNITY): Payer: Self-pay | Admitting: General Surgery

## 2024-07-28 ENCOUNTER — Other Ambulatory Visit: Payer: Self-pay

## 2024-07-28 DIAGNOSIS — K50919 Crohn's disease, unspecified, with unspecified complications: Secondary | ICD-10-CM

## 2024-07-28 DIAGNOSIS — Z13820 Encounter for screening for osteoporosis: Secondary | ICD-10-CM

## 2024-07-29 ENCOUNTER — Telehealth: Payer: Self-pay | Admitting: *Deleted

## 2024-07-29 ENCOUNTER — Other Ambulatory Visit: Payer: Self-pay | Admitting: *Deleted

## 2024-07-29 DIAGNOSIS — Z13820 Encounter for screening for osteoporosis: Secondary | ICD-10-CM

## 2024-07-29 DIAGNOSIS — Z7952 Long term (current) use of systemic steroids: Secondary | ICD-10-CM

## 2024-07-29 DIAGNOSIS — K50919 Crohn's disease, unspecified, with unspecified complications: Secondary | ICD-10-CM

## 2024-07-29 DIAGNOSIS — E559 Vitamin D deficiency, unspecified: Secondary | ICD-10-CM

## 2024-07-29 NOTE — Telephone Encounter (Signed)
 Called patient and left him a message to call our [facility] at [phone] to schedule his 2 year Bone Density Scan. If he has questions to return my call
